# Patient Record
Sex: Female | Born: 1960 | Race: Black or African American | Hispanic: No | Marital: Married | Smoking: Current every day smoker
Health system: Southern US, Community
[De-identification: ages and names within clinical notes are randomized; demographics above are authoritative.]

## PROBLEM LIST (undated history)

## (undated) DIAGNOSIS — I10 Essential (primary) hypertension: Secondary | ICD-10-CM

## (undated) DIAGNOSIS — I714 Abdominal aortic aneurysm, without rupture, unspecified: Secondary | ICD-10-CM

## (undated) DIAGNOSIS — I739 Peripheral vascular disease, unspecified: Secondary | ICD-10-CM

## (undated) DIAGNOSIS — E739 Lactose intolerance, unspecified: Secondary | ICD-10-CM

## (undated) DIAGNOSIS — K219 Gastro-esophageal reflux disease without esophagitis: Secondary | ICD-10-CM

## (undated) DIAGNOSIS — R198 Other specified symptoms and signs involving the digestive system and abdomen: Secondary | ICD-10-CM

## (undated) DIAGNOSIS — M47816 Spondylosis without myelopathy or radiculopathy, lumbar region: Secondary | ICD-10-CM

## (undated) DIAGNOSIS — M069 Rheumatoid arthritis, unspecified: Secondary | ICD-10-CM

## (undated) DIAGNOSIS — E079 Disorder of thyroid, unspecified: Secondary | ICD-10-CM

## (undated) DIAGNOSIS — E785 Hyperlipidemia, unspecified: Secondary | ICD-10-CM

## (undated) DIAGNOSIS — E0590 Thyrotoxicosis, unspecified without thyrotoxic crisis or storm: Secondary | ICD-10-CM

## (undated) DIAGNOSIS — K5090 Crohn's disease, unspecified, without complications: Secondary | ICD-10-CM

## (undated) DIAGNOSIS — R011 Cardiac murmur, unspecified: Secondary | ICD-10-CM

## (undated) DIAGNOSIS — A64 Unspecified sexually transmitted disease: Secondary | ICD-10-CM

## (undated) DIAGNOSIS — E559 Vitamin D deficiency, unspecified: Secondary | ICD-10-CM

## (undated) DIAGNOSIS — R0600 Dyspnea, unspecified: Secondary | ICD-10-CM

## (undated) DIAGNOSIS — F32A Depression, unspecified: Secondary | ICD-10-CM

## (undated) HISTORY — PX: OTHER SURGICAL HISTORY: SHX169

## (undated) HISTORY — PX: TONSILLECTOMY AND ADENOIDECTOMY: SUR1326

## (undated) HISTORY — PX: ABDOMINAL HYSTERECTOMY: SHX81

## (undated) HISTORY — DX: Rheumatoid arthritis, unspecified: M06.9

## (undated) HISTORY — DX: Abdominal aortic aneurysm, without rupture, unspecified: I71.40

## (undated) HISTORY — DX: Unspecified sexually transmitted disease: A64

## (undated) HISTORY — DX: Cardiac murmur, unspecified: R01.1

## (undated) HISTORY — DX: Hyperlipidemia, unspecified: E78.5

## (undated) HISTORY — DX: Vitamin D deficiency, unspecified: E55.9

## (undated) HISTORY — DX: Spondylosis without myelopathy or radiculopathy, lumbar region: M47.816

## (undated) HISTORY — DX: Lactose intolerance, unspecified: E73.9

## (undated) HISTORY — PX: CHOLECYSTECTOMY: SHX55

## (undated) HISTORY — DX: Disorder of thyroid, unspecified: E07.9

## (undated) HISTORY — DX: Essential (primary) hypertension: I10

## (undated) HISTORY — DX: Abdominal aortic aneurysm, without rupture: I71.4

## (undated) HISTORY — DX: Crohn's disease, unspecified, without complications: K50.90

## (undated) HISTORY — PX: COLONOSCOPY: SHX174

---

## 1990-03-05 HISTORY — PX: TONSILLECTOMY AND ADENOIDECTOMY: SUR1326

## 2001-12-26 LAB — PULMONARY FUNCTION TEST

## 2012-07-18 LAB — HM COLONOSCOPY

## 2012-09-17 ENCOUNTER — Ambulatory Visit (INDEPENDENT_AMBULATORY_CARE_PROVIDER_SITE_OTHER): Payer: Medicare HMO | Admitting: Family Medicine

## 2012-09-17 ENCOUNTER — Encounter: Payer: Self-pay | Admitting: Family Medicine

## 2012-09-17 VITALS — BP 158/102 | HR 72 | Temp 98.0°F | Ht 65.5 in | Wt 247.0 lb

## 2012-09-17 DIAGNOSIS — M47817 Spondylosis without myelopathy or radiculopathy, lumbosacral region: Secondary | ICD-10-CM

## 2012-09-17 DIAGNOSIS — M47816 Spondylosis without myelopathy or radiculopathy, lumbar region: Secondary | ICD-10-CM

## 2012-09-17 DIAGNOSIS — M545 Low back pain, unspecified: Secondary | ICD-10-CM

## 2012-09-17 DIAGNOSIS — I10 Essential (primary) hypertension: Secondary | ICD-10-CM

## 2012-09-17 DIAGNOSIS — E7849 Other hyperlipidemia: Secondary | ICD-10-CM | POA: Insufficient documentation

## 2012-09-17 DIAGNOSIS — E785 Hyperlipidemia, unspecified: Secondary | ICD-10-CM | POA: Insufficient documentation

## 2012-09-17 DIAGNOSIS — M79605 Pain in left leg: Secondary | ICD-10-CM

## 2012-09-17 DIAGNOSIS — Z720 Tobacco use: Secondary | ICD-10-CM | POA: Insufficient documentation

## 2012-09-17 DIAGNOSIS — F17200 Nicotine dependence, unspecified, uncomplicated: Secondary | ICD-10-CM

## 2012-09-17 MED ORDER — ATENOLOL 50 MG PO TABS: 50.0000 mg | ORAL_TABLET | Freq: Every day | ORAL | Status: DC

## 2012-09-17 NOTE — Patient Instructions (Addendum)
Nice to meet you. Please stop taking the Toprol. We are starting atenolol 50 mg daily. Follow up in 2 weeks for a complete physical.  Please stop by to see Shirlee Limerick on your way out to set up your referral.
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
## 2012-09-23 ENCOUNTER — Other Ambulatory Visit: Payer: Self-pay | Admitting: Family Medicine

## 2012-09-23 DIAGNOSIS — I10 Essential (primary) hypertension: Secondary | ICD-10-CM

## 2012-09-23 DIAGNOSIS — E785 Hyperlipidemia, unspecified: Secondary | ICD-10-CM

## 2012-09-25 ENCOUNTER — Other Ambulatory Visit (INDEPENDENT_AMBULATORY_CARE_PROVIDER_SITE_OTHER): Payer: Medicare HMO

## 2012-09-25 DIAGNOSIS — E785 Hyperlipidemia, unspecified: Secondary | ICD-10-CM

## 2012-09-25 LAB — LIPID PANEL
Cholesterol: 183 mg/dL (ref 0–200)
HDL: 31.7 mg/dL — ABNORMAL LOW (ref >39.00)
LDL Cholesterol: 124 mg/dL — ABNORMAL HIGH (ref 0–99)
Total CHOL/HDL Ratio: 6
Triglycerides: 135 mg/dL (ref 0.0–149.0)
VLDL: 27 mg/dL (ref 0.0–40.0)

## 2012-09-25 LAB — COMPREHENSIVE METABOLIC PANEL
ALT: 12 U/L (ref 0–35)
AST: 15 U/L (ref 0–37)
Albumin: 4 g/dL (ref 3.5–5.2)
Alkaline Phosphatase: 95 U/L (ref 39–117)
BUN: 21 mg/dL (ref 6–23)
CO2: 24 mEq/L (ref 19–32)
Calcium: 9.7 mg/dL (ref 8.4–10.5)
Chloride: 109 mEq/L (ref 96–112)
Creatinine, Ser: 1.1 mg/dL (ref 0.4–1.2)
GFR: 66.7 mL/min (ref >60.00)
Glucose, Bld: 83 mg/dL (ref 70–99)
Potassium: 4 mEq/L (ref 3.5–5.1)
Sodium: 141 mEq/L (ref 135–145)
Total Bilirubin: 0.5 mg/dL (ref 0.3–1.2)
Total Protein: 7.9 g/dL (ref 6.0–8.3)

## 2012-09-25 LAB — CBC WITH DIFFERENTIAL/PLATELET
Basophils Absolute: 0.1 10*3/uL (ref 0.0–0.1)
Basophils Relative: 0.4 % (ref 0.0–3.0)
Eosinophils Absolute: 0.4 10*3/uL (ref 0.0–0.7)
Eosinophils Relative: 3.4 % (ref 0.0–5.0)
HCT: 48.3 % — ABNORMAL HIGH (ref 36.0–46.0)
Hemoglobin: 15.9 g/dL — ABNORMAL HIGH (ref 12.0–15.0)
Lymphocytes Relative: 32.2 % (ref 12.0–46.0)
Lymphs Abs: 4.2 10*3/uL — ABNORMAL HIGH (ref 0.7–4.0)
MCHC: 32.9 g/dL (ref 30.0–36.0)
MCV: 88.3 fl (ref 78.0–100.0)
Monocytes Absolute: 0.9 10*3/uL (ref 0.1–1.0)
Monocytes Relative: 6.9 % (ref 3.0–12.0)
Neutro Abs: 7.3 10*3/uL (ref 1.4–7.7)
Neutrophils Relative %: 57.1 % (ref 43.0–77.0)
Platelets: 225 10*3/uL (ref 150.0–400.0)
RBC: 5.47 Mil/uL — ABNORMAL HIGH (ref 3.87–5.11)
RDW: 14.7 % — ABNORMAL HIGH (ref 11.5–14.6)
WBC: 12.9 10*3/uL — ABNORMAL HIGH (ref 4.5–10.5)

## 2012-10-02 ENCOUNTER — Encounter: Payer: Self-pay | Admitting: Family Medicine

## 2012-10-02 ENCOUNTER — Other Ambulatory Visit (HOSPITAL_COMMUNITY)
Admission: RE | Admit: 2012-10-02 | Discharge: 2012-10-02 | Disposition: A | Discharge status: Home / Self Care | Payer: Medicare HMO | Source: Ambulatory Visit | Attending: Family Medicine | Admitting: Family Medicine

## 2012-10-02 ENCOUNTER — Ambulatory Visit (INDEPENDENT_AMBULATORY_CARE_PROVIDER_SITE_OTHER): Payer: Medicare HMO | Admitting: Family Medicine

## 2012-10-02 VITALS — BP 158/102 | HR 64 | Temp 98.0°F | Ht 65.5 in | Wt 250.0 lb

## 2012-10-02 DIAGNOSIS — R059 Cough, unspecified: Secondary | ICD-10-CM

## 2012-10-02 DIAGNOSIS — Z720 Tobacco use: Secondary | ICD-10-CM

## 2012-10-02 DIAGNOSIS — Z113 Encounter for screening for infections with a predominantly sexual mode of transmission: Secondary | ICD-10-CM | POA: Insufficient documentation

## 2012-10-02 DIAGNOSIS — Z1231 Encounter for screening mammogram for malignant neoplasm of breast: Secondary | ICD-10-CM

## 2012-10-02 DIAGNOSIS — Z01419 Encounter for gynecological examination (general) (routine) without abnormal findings: Secondary | ICD-10-CM | POA: Insufficient documentation

## 2012-10-02 DIAGNOSIS — Z1151 Encounter for screening for human papillomavirus (HPV): Secondary | ICD-10-CM | POA: Insufficient documentation

## 2012-10-02 DIAGNOSIS — I10 Essential (primary) hypertension: Secondary | ICD-10-CM

## 2012-10-02 DIAGNOSIS — R05 Cough: Secondary | ICD-10-CM

## 2012-10-02 DIAGNOSIS — E785 Hyperlipidemia, unspecified: Secondary | ICD-10-CM

## 2012-10-02 DIAGNOSIS — F17200 Nicotine dependence, unspecified, uncomplicated: Secondary | ICD-10-CM

## 2012-10-02 DIAGNOSIS — D72829 Elevated white blood cell count, unspecified: Secondary | ICD-10-CM

## 2012-10-02 MED ORDER — AZITHROMYCIN 250 MG PO TABS: ORAL_TABLET | ORAL | Status: DC

## 2012-10-02 MED ORDER — ATENOLOL 100 MG PO TABS: 100.0000 mg | ORAL_TABLET | Freq: Every day | ORAL | Status: DC

## 2012-10-02 MED ORDER — VARENICLINE TARTRATE 0.5 MG X 11 & 1 MG X 42 PO MISC: ORAL | Status: DC

## 2012-10-02 MED ORDER — ALBUTEROL SULFATE HFA 108 (90 BASE) MCG/ACT IN AERS: 2.0000 | INHALATION_SPRAY | Freq: Four times a day (QID) | RESPIRATORY_TRACT | Status: DC | PRN
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
## 2012-10-02 NOTE — Patient Instructions (Addendum)
Good to see you. Please come back to see me in 2-4 weeks to recheck your blood pressure.  We are increasing your atenolol to 100 mg daily.  We will also recheck your blood work in 6 months.  Please stop by to see Shirlee Limerick on your way out to set up your mammogram referral.  Please take zpack as directed, use albuterol inhaler as needed for cough and wheezing.  I know you are ready to quit smoking and you CAN do this- take Chantix as directed.

## 2012-10-02 NOTE — Addendum Note (Signed)
Addended by: Eliezer Bottom on: 10/02/2012 05:04 PM   Modules accepted: Orders

## 2012-10-08 LAB — HM PAP SMEAR: HM Pap smear: NORMAL

## 2012-10-09 ENCOUNTER — Encounter: Payer: Self-pay | Admitting: Family Medicine

## 2012-10-17 ENCOUNTER — Ambulatory Visit
Admission: RE | Admit: 2012-10-17 | Discharge: 2012-10-17 | Disposition: A | Discharge status: Home / Self Care | Payer: Medicare HMO | Source: Ambulatory Visit | Attending: Family Medicine | Admitting: Family Medicine

## 2012-10-17 DIAGNOSIS — Z1231 Encounter for screening mammogram for malignant neoplasm of breast: Secondary | ICD-10-CM

## 2012-10-24 ENCOUNTER — Ambulatory Visit (INDEPENDENT_AMBULATORY_CARE_PROVIDER_SITE_OTHER): Payer: Medicare HMO | Admitting: Family Medicine

## 2012-10-24 ENCOUNTER — Encounter: Payer: Self-pay | Admitting: Family Medicine

## 2012-10-24 VITALS — BP 142/82 | HR 60 | Temp 97.8°F | Wt 252.8 lb

## 2012-10-24 DIAGNOSIS — I10 Essential (primary) hypertension: Secondary | ICD-10-CM

## 2012-10-24 DIAGNOSIS — E785 Hyperlipidemia, unspecified: Secondary | ICD-10-CM

## 2012-10-24 DIAGNOSIS — R7989 Other specified abnormal findings of blood chemistry: Secondary | ICD-10-CM

## 2012-10-24 NOTE — Progress Notes (Signed)
Subjective:    Patient ID: Kathy Herrera [facility], female    DOB: 1960-12-28, 52 y.o.   MRN: [id number]  HPI Very pleasant 52 yo female who established care with last month here for follow up.  HTN - HTN for over 10 years.  Previously well controlled on Atenolol and lisinopril.  Recently, current PCP changed atenolol to Toprol XL and she is not sure whyand she had poorly controlled HTN.   Restarted atenolol and increased dose t 100 mg daily on 10/02/2012.  Also takes Lisinopril 40 mg daily.  Feels like BP has been improving.    BP Readings from Last 3 Encounters:  10/24/12 142/82  10/02/12 158/102  09/17/12 158/102     Tobacco abuse- has tried patches and gums, have not worked.  She does want to quit smoking.  Prescribed Chantix on 7/31.  She has already cut back to 1/2 ppd and feels great.  She is worried that she will continue to gain weight but otherwise feels good.  Lab Results  Component Value Date   CHOL 183 09/25/2012   HDL 31.70* 09/25/2012   LDLCALC 124* 09/25/2012   TRIG 135.0 09/25/2012   CHOLHDL 6 09/25/2012    Patient Active Problem List   Diagnosis Date Noted  . Abnormal CBC 10/24/2012  . Cough 10/02/2012  . Tobacco abuse 09/17/2012  . DJD (degenerative joint disease) of lumbar spine   . Hypertension   . Hyperlipidemia    Past Medical History  Diagnosis Date  . DJD (degenerative joint disease) of lumbar spine   . Heart murmur   . Hypertension   . Hyperlipidemia    Past Surgical History  Procedure Laterality Date  . Abdominal hysterectomy      partial- still has cervix.  . Cholecystectomy    . Tonsillectomy and adenoidectomy     History  Substance Use Topics  . Smoking status: Current Every Day Smoker  . Smokeless tobacco: Not on file  . Alcohol Use: Not on file   Family History  Problem Relation Age of Onset  . Cancer Mother 43    cervical   No Known Allergies Current Outpatient Prescriptions on File Prior to Visit  Medication Sig Dispense Refill  .  albuterol (PROVENTIL HFA;VENTOLIN HFA) 108 (90 BASE) MCG/ACT inhaler Inhale 2 puffs into the lungs every 6 (six) hours as needed for wheezing.  1 Inhaler  0  . atenolol (TENORMIN) 100 MG tablet Take 1 tablet (100 mg total) by mouth daily.  90 tablet  3  . azithromycin (ZITHROMAX) 250 MG tablet 2 tabs by mouth on day 1 followed by 1 tab by mouth daily days 2-5  6 tablet  0  . diclofenac (VOLTAREN) 75 MG EC tablet Take 75 mg by mouth 2 (two) times daily.      . ergocalciferol (VITAMIN D2) 50000 UNITS capsule Take 50,000 Units by mouth once a week.      Marland Kitchen lisinopril (PRINIVIL,ZESTRIL) 40 MG tablet Take 40 mg by mouth daily.      . traMADol (ULTRAM) 50 MG tablet Take one by mouth daily      . varenicline (CHANTIX STARTING MONTH PAK) 0.5 MG X 11 & 1 MG X 42 tablet Take one 0.5 mg tablet by mouth once daily for 3 days, then increase to one 0.5 mg tablet twice daily for 4 days, then increase to one 1 mg tablet twice daily.  53 tablet  0   No current facility-administered medications on file prior to visit.  The PMH, PSH, Social History, Family History, Medications, and allergies have been reviewed in [facility], and have been updated if relevant.    Review of Systems See HPI No CP, HA or SOB    Objective:   Physical Exam  Wt Readings from Last 3 Encounters:  10/24/12 252 lb 12.8 oz (114.669 kg)  10/02/12 250 lb (113.399 kg)  09/17/12 247 lb (112.038 kg)     General:  Obese, well-nourished,in no acute distress; alert,appropriate and cooperative throughout examination Head:  normocephalic and atraumatic.   Eyes:  vision grossly intact, pupils equal, pupils round, and pupils reactive to light.   Ears:  R ear normal and L ear normal.   Nose:  no external deformity.   Mouth:  good dentition.   Neck:  No deformities, masses, or tenderness noted. Heart:  Normal rate and regular rhythm. S1 and S2 normal without gallop, murmur, click, rub or other extra sounds. Msk:  No deformity or scoliosis noted of  thoracic or lumbar spine.   Extremities:  No clubbing, cyanosis, edema, or deformity noted with normal full range of motion of all joints.   Neurologic:  alert & oriented X3 and gait normal.   Skin:  Intact without suspicious lesions or rashes Psych:  Cognition and judgment appear intact. Alert and cooperative with normal attention span and concentration. No apparent delusions, illusions, hallucinations      Assessment & Plan:  1. Hypertension Improved, almost at goal.  2. Tobacco abuse Improving.  She has cut back to 1/2 ppd.  3. Hyperlipidemia Diet controlled.

## 2012-10-24 NOTE — Patient Instructions (Addendum)
Good to see you. When you get your blood pressure cuff, please call me with readings.  I am so proud of you!

## 2012-11-21 ENCOUNTER — Ambulatory Visit (INDEPENDENT_AMBULATORY_CARE_PROVIDER_SITE_OTHER): Payer: Medicare HMO | Admitting: Neurology

## 2012-11-21 ENCOUNTER — Ambulatory Visit (INDEPENDENT_AMBULATORY_CARE_PROVIDER_SITE_OTHER): Payer: Self-pay

## 2012-11-21 DIAGNOSIS — Z0289 Encounter for other administrative examinations: Secondary | ICD-10-CM

## 2012-11-21 DIAGNOSIS — Z720 Tobacco use: Secondary | ICD-10-CM

## 2012-11-21 DIAGNOSIS — E785 Hyperlipidemia, unspecified: Secondary | ICD-10-CM

## 2012-11-21 DIAGNOSIS — M47816 Spondylosis without myelopathy or radiculopathy, lumbar region: Secondary | ICD-10-CM

## 2012-11-21 DIAGNOSIS — G544 Lumbosacral root disorders, not elsewhere classified: Secondary | ICD-10-CM

## 2012-11-21 DIAGNOSIS — I10 Essential (primary) hypertension: Secondary | ICD-10-CM
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
## 2012-12-18 ENCOUNTER — Encounter: Payer: Self-pay | Admitting: Family Medicine

## 2012-12-18 ENCOUNTER — Ambulatory Visit (INDEPENDENT_AMBULATORY_CARE_PROVIDER_SITE_OTHER): Payer: Medicare HMO | Admitting: Family Medicine

## 2012-12-18 VITALS — BP 132/94 | HR 73 | Temp 98.0°F | Wt 255.5 lb

## 2012-12-18 DIAGNOSIS — Z23 Encounter for immunization: Secondary | ICD-10-CM

## 2012-12-18 DIAGNOSIS — R9431 Abnormal electrocardiogram [ECG] [EKG]: Secondary | ICD-10-CM | POA: Insufficient documentation

## 2012-12-18 DIAGNOSIS — Z01818 Encounter for other preprocedural examination: Secondary | ICD-10-CM

## 2012-12-18 NOTE — Patient Instructions (Signed)
Good to see you. We will call you with a cardiology appointment.  Keep working on putting down those cigarettes.

## 2012-12-18 NOTE — Progress Notes (Signed)
Subjective:    Kathy Herrera is a 52 y.o. female who presents to the office today for a preoperative consultation at the request of surgeon Otelia Sergeant who plans on performing lumbar surgery.    Planned anesthesia: general. The patient has the following known anesthesia issues: none. Patients bleeding risk: no recent abnormal bleeding.   Patient Active Problem List   Diagnosis Date Noted  . Pre-operative clearance 12/18/2012  . Nonspecific abnormal electrocardiogram (ECG) (EKG) 12/18/2012  . Abnormal CBC 10/24/2012  . Cough 10/02/2012  . Tobacco abuse 09/17/2012  . DJD (degenerative joint disease) of lumbar spine   . Hypertension   . Hyperlipidemia    Past Medical History  Diagnosis Date  . DJD (degenerative joint disease) of lumbar spine   . Heart murmur   . Hypertension   . Hyperlipidemia    Past Surgical History  Procedure Laterality Date  . Abdominal hysterectomy      partial- still has cervix.  . Cholecystectomy    . Tonsillectomy and adenoidectomy     History  Substance Use Topics  . Smoking status: Current Every Day Smoker -- 0.50 packs/day  . Smokeless tobacco: Not on file  . Alcohol Use: No   Family History  Problem Relation Age of Onset  . Cancer Mother 96    cervical   No Known Allergies Current Outpatient Prescriptions on File Prior to Visit  Medication Sig Dispense Refill  . albuterol (PROVENTIL HFA;VENTOLIN HFA) 108 (90 BASE) MCG/ACT inhaler Inhale 2 puffs into the lungs every 6 (six) hours as needed for wheezing.  1 Inhaler  0  . atenolol (TENORMIN) 100 MG tablet Take 1 tablet (100 mg total) by mouth daily.  90 tablet  3  . diclofenac (VOLTAREN) 75 MG EC tablet Take 75 mg by mouth 2 (two) times daily.      Marland Kitchen lisinopril (PRINIVIL,ZESTRIL) 40 MG tablet Take 40 mg by mouth daily.      . traMADol (ULTRAM) 50 MG tablet Take one by mouth daily       No current facility-administered medications on file prior to visit.   The PMH, PSH, Social History, Family  History, Medications, and allergies have been reviewed in [facility], and have been updated if relevant.     Review of Systems No CP or SOB No DOE but per pt "cannot move that fast"   Objective:  BP 132/94  Pulse 73  Temp(Src) 98 F (36.7 C) (Oral)  Wt 255 lb 8 oz (115.894 kg)  BMI 41.86 kg/m2  SpO2 95% Gen:  Alert, pleasant, obese, NAD Resp:  CTA bilaterally CVS:  RRR Ext:  No edema  Predictors of intubation difficulty:  Morbid obesity? yes    Cardiographics ECG: left axis anterior fasicular, diffuse non specific T changes heart block   Lab Review  Lab Results  Component Value Date   WBC 12.9* 09/25/2012   HGB 15.9* 09/25/2012   HCT 48.3* 09/25/2012   MCV 88.3 09/25/2012   PLT 225.0 09/25/2012   Lab Results  Component Value Date   CREATININE 1.1 09/25/2012   Lab Results  Component Value Date   NA 141 09/25/2012   K 4.0 09/25/2012   CL 109 09/25/2012   CO2 24 09/25/2012      Assessment:      52 y.o. female with planned surgery as above.   Known risk factors for perioperative complications: None Tobacco abuse, obesity, abnormal EKG         Plan:   Cardiac Risk  Estimation: Low but will refer to cardiology given EKG changes (unchanged since 12/2011).

## 2012-12-23 ENCOUNTER — Encounter: Payer: Self-pay | Admitting: Cardiovascular Disease

## 2012-12-23 ENCOUNTER — Ambulatory Visit (INDEPENDENT_AMBULATORY_CARE_PROVIDER_SITE_OTHER): Payer: Medicare HMO | Admitting: Cardiovascular Disease

## 2012-12-23 VITALS — BP 180/105 | HR 54 | Ht 65.0 in | Wt 257.5 lb

## 2012-12-23 DIAGNOSIS — I10 Essential (primary) hypertension: Secondary | ICD-10-CM

## 2012-12-23 DIAGNOSIS — R9431 Abnormal electrocardiogram [ECG] [EKG]: Secondary | ICD-10-CM

## 2012-12-23 DIAGNOSIS — R000 Tachycardia, unspecified: Secondary | ICD-10-CM

## 2012-12-23 MED ORDER — HYDROCHLOROTHIAZIDE 25 MG PO TABS: 25.0000 mg | ORAL_TABLET | Freq: Every day | ORAL | Status: DC

## 2012-12-23 MED ORDER — POTASSIUM CHLORIDE ER 10 MEQ PO TBCR: 10.0000 meq | EXTENDED_RELEASE_TABLET | Freq: Every day | ORAL | Status: DC

## 2012-12-23 NOTE — Assessment & Plan Note (Signed)
Her blood pressure is still significant elevated. I had a long discussion with her regarding importance of avoiding salt, smoking cessation, weight loss.   We will start HCTZ 25 mg a day and potassium chloride 10 mg a day. She has some concerns about starting HCTZ because she goes to the bathroom so much at night.  I will see her 1 time in followup to make sure that her electrolytes remained stable. Otherwise, she'll followup with her general medical Dr.

## 2012-12-23 NOTE — Patient Instructions (Addendum)
Your physician has recommended you make the following change in your medication:  1) Start hydrochlorothiazide (HCTZ) 25 mg one tablet by mouth daily. 2) Start potassium 10 meq one tablet by mouth   Your physician has requested that you have an echocardiogram in the [place] office. Echocardiography is a painless test that uses sound waves to create images of your heart. It provides your doctor with information about the size and shape of your heart and how well your heart's chambers and valves are working. This procedure takes approximately one hour. There are no restrictions for this procedure.  Your physician recommends that you schedule a follow-up appointment in: 4-6 weeks with Dr. Elease Hashimoto in the [place] office.

## 2012-12-23 NOTE — Assessment & Plan Note (Signed)
Kathy Herrera presents today for further evaluation of an abnormal EKG. Her EKG reveals left ventricular hypertrophy with repolarization of breath. We'll get an echocardiogram to verify that she has normal left ventricular systolic function but I suspect that we will find that she has significant LVH as an explanation for her abnormal EKG. She does not have any episodes of angina or shortness breath. She is limited only by her back and hip pain.  From my standpoint, she is at low risk for having back surgery although we should make sure that her hypertension is well controlled before she goes in for surgery. I Started her on HCTZ and potassium in addition to her other medications. She'll followup with her general medical Dr. Diamantina Providence see her in 4-6 weeks to ensure that  she has 1 follow up after starting new medications.  Will get a basic metabolic profile.  Following that I will turn her  back over to her medical Dr.

## 2012-12-23 NOTE — Progress Notes (Signed)
Kathy Herrera Date of Birth  09-Aug-1960       [facility]    [place] [address], [address]  [address] [place], [place]  [address]   [place], [place]  [address] [phone]     [phone]   Fax  [phone]    Fax [phone]  Problem List: 1. Hypertension 2. Chronic back pain 3.  Hyperlipidemia  History of Present Illness:  Kathy Herrera is a 52 yo with history of hypertension, hyperlipidemia, and chronic back pain. She was referred over for preoperative evaluation prior to having back surgery. Her EKG has some mild abnormalities. EKG is unchanged since 2013.  She takes her medications regularly.  She still smokes.   She still eats some seasoned foods.  She does eat out occasionally.  She does not get any regular exercise ( due to back pain).    She is able to walk up stairs, bring in the groceries without any CP or dyspnea.  She is limited by her back and hip pain.    Current Outpatient Prescriptions on File Prior to Visit  Medication Sig Dispense Refill  . albuterol (PROVENTIL HFA;VENTOLIN HFA) 108 (90 BASE) MCG/ACT inhaler Inhale 2 puffs into the lungs every 6 (six) hours as needed for wheezing.  1 Inhaler  0  . atenolol (TENORMIN) 100 MG tablet Take 1 tablet (100 mg total) by mouth daily.  90 tablet  3  . diclofenac (VOLTAREN) 75 MG EC tablet Take 75 mg by mouth 2 (two) times daily.      Marland Kitchen HYDROcodone-acetaminophen (NORCO/VICODIN) 5-325 MG per tablet Take 1 tablet by mouth daily as needed.      Marland Kitchen lisinopril (PRINIVIL,ZESTRIL) 40 MG tablet Take 40 mg by mouth daily.       No current facility-administered medications on file prior to visit.    Allergies  Allergen Reactions  . Shrimp [Shellfish Allergy]     May be seasoning not always    Past Medical History  Diagnosis Date  . DJD (degenerative joint disease) of lumbar spine   . Heart murmur   . Hypertension   . Hyperlipidemia     Past Surgical History  Procedure Laterality  Date  . Abdominal hysterectomy      partial- still has cervix.  . Cholecystectomy    . Tonsillectomy and adenoidectomy    . Arm surgery Left     History  Smoking status  . Current Every Day Smoker -- 0.50 packs/day for 40 years  . Types: Cigarettes  Smokeless tobacco  . Not on file    History  Alcohol Use  . Yes    Comment: occassion    Family History  Problem Relation Age of Onset  . Cancer Mother 17    cervical  . CVA Mother   . Hypertension Mother     Reviw of Systems:  Reviewed in the HPI.  All other systems are negative.  Physical Exam: Blood pressure 214/103, pulse 54, height 5\' 5"  (1.651 m), weight 257 lb 8 oz (116.801 kg). General: Well developed, well nourished, in no acute distress.  Head: Normocephalic, atraumatic, sclera non-icteric, mucus membranes are moist,   Neck: Supple. Carotids are 2 + without bruits. No JVD   Lungs: Clear   Heart: RR, normal S1, s2  Abdomen: Soft, non-tender, non-distended with normal bowel sounds, moderately obese  Msk:  Strength and tone are normal   Extremities: No clubbing or cyanosis. No edema.  Distal  pedal pulses are 2+ and equal    Neuro: CN II - XII intact.  Alert and oriented X 3.   Psych:  Normal   ECG: Oct. 21, 2014:  Sinus brady at 61.  TWI in lateral leads - c/w LVH with repol abn.  Assessment / Plan:

## 2013-01-07 ENCOUNTER — Ambulatory Visit (HOSPITAL_COMMUNITY): Payer: Medicare HMO | Attending: Cardiovascular Disease | Admitting: Cardiology

## 2013-01-07 ENCOUNTER — Other Ambulatory Visit (HOSPITAL_COMMUNITY): Payer: Self-pay | Admitting: Cardiovascular Disease

## 2013-01-07 DIAGNOSIS — E785 Hyperlipidemia, unspecified: Secondary | ICD-10-CM | POA: Insufficient documentation

## 2013-01-07 DIAGNOSIS — R9431 Abnormal electrocardiogram [ECG] [EKG]: Secondary | ICD-10-CM | POA: Insufficient documentation

## 2013-01-07 DIAGNOSIS — I10 Essential (primary) hypertension: Secondary | ICD-10-CM | POA: Insufficient documentation

## 2013-01-07 DIAGNOSIS — F17200 Nicotine dependence, unspecified, uncomplicated: Secondary | ICD-10-CM | POA: Insufficient documentation

## 2013-01-07 NOTE — Progress Notes (Signed)
Echo performed. 

## 2013-01-22 ENCOUNTER — Telehealth: Payer: Self-pay | Admitting: Cardiovascular Disease

## 2013-01-22 NOTE — Telephone Encounter (Signed)
Pt had to cancel her Monday appt with Dr. Elease Hashimoto. I have rescheduled that appointment for her. I reviewed her office note with Dr. Elease Hashimoto & she is aware he wanted to follow-up with her after starting the HCTZ & Potassium & possible lab work.  Pt understands & agrees with appt schedule. She will follow-up with her pcp after for htn. Mylo Red RN

## 2013-01-22 NOTE — Telephone Encounter (Signed)
New problem:  Pt is asking if she needs to f/u with Dr. Elease Hashimoto about her BP or if she can just f/u with her PcP. Pt canceled her appt w/ Dr. Elease Hashimoto that was scheduled for Monday. Pt states she does not have the money to come... Pt would like to be advised if she medically needs to f/u w/ Dr. Elease Hashimoto

## 2013-01-23 NOTE — Telephone Encounter (Signed)
noted 

## 2013-01-26 ENCOUNTER — Ambulatory Visit: Payer: Medicare HMO | Admitting: Cardiovascular Disease

## 2013-02-11 ENCOUNTER — Other Ambulatory Visit: Payer: Self-pay | Admitting: *Deleted

## 2013-02-11 ENCOUNTER — Ambulatory Visit (INDEPENDENT_AMBULATORY_CARE_PROVIDER_SITE_OTHER): Payer: Commercial Managed Care - HMO | Admitting: Physician Assistant

## 2013-02-11 ENCOUNTER — Encounter: Payer: Self-pay | Admitting: Physician Assistant

## 2013-02-11 VITALS — BP 160/98 | HR 52 | Ht 66.0 in | Wt 254.0 lb

## 2013-02-11 DIAGNOSIS — E66813 Obesity, class 3: Secondary | ICD-10-CM

## 2013-02-11 DIAGNOSIS — I10 Essential (primary) hypertension: Secondary | ICD-10-CM

## 2013-02-11 MED ORDER — HYDROCHLOROTHIAZIDE 25 MG PO TABS: 25.0000 mg | ORAL_TABLET | Freq: Every day | ORAL | Status: DC

## 2013-02-11 MED ORDER — CLONIDINE HCL 0.1 MG PO TABS: 0.1000 mg | ORAL_TABLET | Freq: Every day | ORAL | Status: DC

## 2013-02-11 MED ORDER — POTASSIUM CHLORIDE ER 10 MEQ PO TBCR: 10.0000 meq | EXTENDED_RELEASE_TABLET | Freq: Every day | ORAL | Status: DC

## 2013-02-11 NOTE — Assessment & Plan Note (Signed)
Smoking cessation discussed 

## 2013-02-11 NOTE — Assessment & Plan Note (Signed)
Weight loss discussed with the patient. She is unable to exercise because of her chronic back pain. Hopefully after she has surgery she will be able begin an exercise program.

## 2013-02-11 NOTE — Assessment & Plan Note (Signed)
Blood pressure is still elevated today. Have talked to her about reducing her sodium intake and trying to lose weight. All add low-dose clonidine 0.1 mg daily. She would like to followup with her primary care for her hypertension. Recommend 2 g sodium diet, weight loss, and smoking cessation. 2-D echo showed mild LVH with grade 1 diastolic dysfunction. Followup with Dr. Elease Hashimoto her in 1 year.
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
## 2013-02-11 NOTE — Patient Instructions (Addendum)
Your physician has recommended you make the following change in your medication:   1. Start clonidine 0.1mg  1 tab daily  2 Gram Low Sodium Diet A 2 gram sodium diet restricts the amount of sodium in the diet to no more than 2 g or 2000 mg daily. Limiting the amount of sodium is often used to help lower blood pressure. It is important if you have heart, liver, or kidney problems. Many foods contain sodium for flavor and sometimes as a preservative. When the amount of sodium in a diet needs to be low, it is important to know what to look for when choosing foods and drinks. The following includes some information and guidelines to help make it easier for you to adapt to a low sodium diet. QUICK TIPS  Do not add salt to food.  Avoid convenience items and fast food.  Choose unsalted snack foods.  Buy lower sodium products, often labeled as "lower sodium" or "no salt added."  Check food labels to learn how much sodium is in 1 serving.  When eating at a restaurant, ask that your food be prepared with less salt or none, if possible. READING FOOD LABELS FOR SODIUM INFORMATION The nutrition facts label is a good place to find how much sodium is in foods. Look for products with no more than 500 to 600 mg of sodium per meal and no more than 150 mg per serving. Remember that 2 g = 2000 mg. The food label may also list foods as:  Sodium-free: Less than 5 mg in a serving.  Very low sodium: 35 mg or less in a serving.  Low-sodium: 140 mg or less in a serving.  Light in sodium: 50% less sodium in a serving. For example, if a food that usually has 300 mg of sodium is changed to become light in sodium, it will have 150 mg of sodium.  Reduced sodium: 25% less sodium in a serving. For example, if a food that usually has 400 mg of sodium is changed to reduced sodium, it will have 300 mg of sodium. CHOOSING FOODS Grains  Avoid: Salted crackers and snack items. Some cereals, including instant hot  cereals. Bread stuffing and biscuit mixes. Seasoned rice or pasta mixes.  Choose: Unsalted snack items. Low-sodium cereals, oats, puffed wheat and rice, shredded wheat. English muffins and bread. Pasta. Meats  Avoid: Salted, canned, smoked, spiced, pickled meats, including fish and poultry. Bacon, ham, sausage, cold cuts, hot dogs, anchovies.  Choose: Low-sodium canned tuna and salmon. Fresh or frozen meat, poultry, and fish. Dairy  Avoid: Processed cheese and spreads. Cottage cheese. Buttermilk and condensed milk. Regular cheese.  Choose: Milk. Low-sodium cottage cheese. Yogurt. Sour cream. Low-sodium cheese. Fruits and Vegetables  Avoid: Regular canned vegetables. Regular canned tomato sauce and paste. Frozen vegetables in sauces. Olives. Rosita Fire. Relishes. Sauerkraut.  Choose: Low-sodium canned vegetables. Low-sodium tomato sauce and paste. Frozen or fresh vegetables. Fresh and frozen fruit. Condiments  Avoid: Canned and packaged gravies. Worcestershire sauce. Tartar sauce. Barbecue sauce. Soy sauce. Steak sauce. Ketchup. Onion, garlic, and table salt. Meat flavorings and tenderizers.  Choose: Fresh and dried herbs and spices. Low-sodium varieties of mustard and ketchup. Lemon juice. Tabasco sauce. Horseradish. SAMPLE 2 GRAM SODIUM MEAL PLAN Breakfast / Sodium (mg)  1 cup low-fat milk / 143 mg  2 slices whole-wheat toast / 270 mg  1 tbs heart-healthy margarine / 153 mg  1 hard-boiled egg / 139 mg  1 small orange / 0 mg Lunch /  Sodium (mg)  1 cup raw carrots / 76 mg   cup hummus / 298 mg  1 cup low-fat milk / 143 mg   cup red grapes / 2 mg  1 whole-wheat pita bread / 356 mg Dinner / Sodium (mg)  1 cup whole-wheat pasta / 2 mg  1 cup low-sodium tomato sauce / 73 mg  3 oz lean ground beef / 57 mg  1 small side salad (1 cup raw spinach leaves,  cup cucumber,  cup yellow bell pepper) with 1 tsp olive oil and 1 tsp red wine vinegar / 25 mg Snack / Sodium  (mg)  1 container low-fat vanilla yogurt / 107 mg  3 graham cracker squares / 127 mg Nutrient Analysis  Calories: 2033  Protein: 77 g  Carbohydrate: 282 g  Fat: 72 g  Sodium: 1971 mg Document Released: 02/19/2005 Document Revised: 05/14/2011 Document Reviewed: 05/23/2009 ExitCare Patient Information 2014 [place], [place].   Your physician recommends that you schedule a follow-up appointment with your primary MD    .

## 2013-02-17 ENCOUNTER — Ambulatory Visit: Payer: Medicare HMO | Admitting: Cardiovascular Disease

## 2013-03-16 ENCOUNTER — Ambulatory Visit (INDEPENDENT_AMBULATORY_CARE_PROVIDER_SITE_OTHER): Payer: Medicare HMO | Admitting: Family Medicine

## 2013-03-16 ENCOUNTER — Encounter: Payer: Self-pay | Admitting: Family Medicine

## 2013-03-16 VITALS — BP 124/72 | HR 72 | Temp 97.9°F | Wt 247.2 lb

## 2013-03-16 DIAGNOSIS — F17200 Nicotine dependence, unspecified, uncomplicated: Secondary | ICD-10-CM

## 2013-03-16 DIAGNOSIS — Z720 Tobacco use: Secondary | ICD-10-CM

## 2013-03-16 DIAGNOSIS — E785 Hyperlipidemia, unspecified: Secondary | ICD-10-CM

## 2013-03-16 DIAGNOSIS — I10 Essential (primary) hypertension: Secondary | ICD-10-CM

## 2013-03-16 MED ORDER — HYDROCODONE-ACETAMINOPHEN 5-325 MG PO TABS: 1.0000 | ORAL_TABLET | Freq: Four times a day (QID) | ORAL | Status: DC | PRN

## 2013-03-16 MED ORDER — LISINOPRIL 40 MG PO TABS: 40.0000 mg | ORAL_TABLET | Freq: Every day | ORAL | Status: DC

## 2013-03-16 MED ORDER — MELOXICAM 15 MG PO TABS: 15.0000 mg | ORAL_TABLET | Freq: Every day | ORAL | Status: DC | PRN

## 2013-03-16 MED ORDER — GABAPENTIN 100 MG PO CAPS: 100.0000 mg | ORAL_CAPSULE | Freq: Every day | ORAL | Status: DC

## 2013-03-16 NOTE — Assessment & Plan Note (Signed)
Smoking cessation discussed.  Still not yet ready to quit.

## 2013-03-16 NOTE — Assessment & Plan Note (Signed)
Improving!  Encouraged her to continue.

## 2013-03-16 NOTE — Patient Instructions (Signed)
Good luck with your surgery! Your blood pressure looks great. I KNOW you can quit smoking!

## 2013-03-16 NOTE — Progress Notes (Signed)
Pre-visit discussion using our clinic review tool. No additional management support is needed unless otherwise documented below in the visit note.  

## 2013-03-16 NOTE — Assessment & Plan Note (Signed)
Well controlled on current rx. No changes. 

## 2013-03-16 NOTE — Progress Notes (Signed)
Subjective:    Patient ID: [place], female    DOB: Jul 01, 1960, 53 y.o.   MRN: [id number]  HPI Very pleasant 53 yo female here for follow up.  HTN - HTN for over 10 years.  Now well controlled on HCTZ, Clonidine, atenolol and Lisinopril.   Denies any HA, blurred vision, CP or SOB. BP Readings from Last 3 Encounters:  03/16/13 124/72  02/11/13 160/98  12/23/12 180/105     Tobacco abuse- smokes cigars.  Has tried patches and gums, have not worked.  She does want to quit smoking.  Prescribed Chantix on 7/31.    Having back surgery next month and thinks she can quit smoking then- "it will be a jump start."  Excited about getting her back surgery.  Wants to be more active so she can lose weight.  Has been working on diet and weight loss.  Wt Readings from Last 3 Encounters:  03/16/13 247 lb 4 oz (112.152 kg)  02/11/13 254 lb (115.214 kg)  12/23/12 257 lb 8 oz (116.801 kg)     Lab Results  Component Value Date   CHOL 183 09/25/2012   HDL 31.70* 09/25/2012   LDLCALC 124* 09/25/2012   TRIG 135.0 09/25/2012   CHOLHDL 6 09/25/2012    Patient Active Problem List   Diagnosis Date Noted  . Obesity, Class III, BMI 40-49.9 (morbid obesity) 02/11/2013  . Nonspecific abnormal electrocardiogram (ECG) (EKG) 12/18/2012  . Abnormal CBC 10/24/2012  . Tobacco abuse 09/17/2012  . DJD (degenerative joint disease) of lumbar spine   . Hypertension   . Hyperlipidemia    Past Medical History  Diagnosis Date  . DJD (degenerative joint disease) of lumbar spine   . Heart murmur   . Hypertension   . Hyperlipidemia    Past Surgical History  Procedure Laterality Date  . Abdominal hysterectomy      partial- still has cervix.  . Cholecystectomy    . Tonsillectomy and adenoidectomy    . Arm surgery Left    History  Substance Use Topics  . Smoking status: Current Every Day Smoker -- 0.50 packs/day for 40 years    Types: Cigarettes  . Smokeless tobacco: Not on file  . Alcohol Use: Yes     Comment: occassion   Family History  Problem Relation Age of Onset  . Cancer Mother 4    cervical  . CVA Mother   . Hypertension Mother    Allergies  Allergen Reactions  . Shrimp [Shellfish Allergy]     May be seasoning not always   Current Outpatient Prescriptions on File Prior to Visit  Medication Sig Dispense Refill  . albuterol (PROVENTIL HFA;VENTOLIN HFA) 108 (90 BASE) MCG/ACT inhaler Inhale 2 puffs into the lungs every 6 (six) hours as needed for wheezing.  1 Inhaler  0  . aspirin 81 MG tablet Take 81 mg by mouth daily.      Marland Kitchen atenolol (TENORMIN) 100 MG tablet Take 1 tablet (100 mg total) by mouth daily.  90 tablet  3  . cloNIDine (CATAPRES) 0.1 MG tablet Take 1 tablet (0.1 mg total) by mouth daily.  90 tablet  2  . diclofenac (VOLTAREN) 75 MG EC tablet Take 75 mg by mouth 2 (two) times daily.      . hydrochlorothiazide (HYDRODIURIL) 25 MG tablet Take 1 tablet (25 mg total) by mouth daily.  90 tablet  2  . potassium chloride (K-DUR) 10 MEQ tablet Take 1 tablet (10 mEq total) by mouth daily.  90 tablet  2   No current facility-administered medications on file prior to visit.   The PMH, PSH, Social History, Family History, Medications, and allergies have been reviewed in [facility], and have been updated if relevant.    Review of Systems See HPI No CP, HA or SOB    Objective:   Physical Exam  Wt Readings from Last 3 Encounters:  03/16/13 247 lb 4 oz (112.152 kg)  02/11/13 254 lb (115.214 kg)  12/23/12 257 lb 8 oz (116.801 kg)     General:  Obese, well-nourished,in no acute distress; alert,appropriate and cooperative throughout examination Head:  normocephalic and atraumatic.   Eyes:  vision grossly intact, pupils equal, pupils round, and pupils reactive to light.   Ears:  R ear normal and L ear normal.   Nose:  no external deformity.   Mouth:  good dentition.   Neck:  No deformities, masses, or tenderness noted. Heart:  Normal rate and regular rhythm. S1 and S2  normal without gallop, murmur, click, rub or other extra sounds. Msk:  No deformity or scoliosis noted of thoracic or lumbar spine.   Extremities:  No clubbing, cyanosis, edema, or deformity noted with normal full range of motion of all joints.   Neurologic:  alert & oriented X3 and gait normal.   Skin:  Intact without suspicious lesions or rashes Psych:  Cognition and judgment appear intact. Alert and cooperative with normal attention span and concentration. No apparent delusions, illusions, hallucinations      Assessment & Plan:

## 2013-03-17 ENCOUNTER — Telehealth: Payer: Self-pay | Admitting: Family Medicine

## 2013-03-17 NOTE — Telephone Encounter (Signed)
Relevant patient education assigned to patient using Emmi. ° °

## 2013-04-06 ENCOUNTER — Encounter: Payer: Self-pay | Admitting: Family Medicine

## 2013-04-06 ENCOUNTER — Other Ambulatory Visit (INDEPENDENT_AMBULATORY_CARE_PROVIDER_SITE_OTHER): Payer: Medicare HMO

## 2013-04-06 DIAGNOSIS — D72829 Elevated white blood cell count, unspecified: Secondary | ICD-10-CM

## 2013-04-06 LAB — CBC WITH DIFFERENTIAL/PLATELET
Basophils Absolute: 0.1 10*3/uL (ref 0.0–0.1)
Basophils Relative: 0.5 % (ref 0.0–3.0)
Eosinophils Absolute: 0.6 10*3/uL (ref 0.0–0.7)
Eosinophils Relative: 5.2 % — ABNORMAL HIGH (ref 0.0–5.0)
HCT: 43.3 % (ref 36.0–46.0)
Hemoglobin: 13.9 g/dL (ref 12.0–15.0)
Lymphocytes Relative: 38.3 % (ref 12.0–46.0)
Lymphs Abs: 4.2 10*3/uL — ABNORMAL HIGH (ref 0.7–4.0)
MCHC: 32 g/dL (ref 30.0–36.0)
MCV: 90.1 fl (ref 78.0–100.0)
Monocytes Absolute: 0.8 10*3/uL (ref 0.1–1.0)
Monocytes Relative: 7.4 % (ref 3.0–12.0)
Neutro Abs: 5.4 10*3/uL (ref 1.4–7.7)
Neutrophils Relative %: 48.6 % (ref 43.0–77.0)
Platelets: 229 10*3/uL (ref 150.0–400.0)
RBC: 4.81 Mil/uL (ref 3.87–5.11)
RDW: 14.7 % — ABNORMAL HIGH (ref 11.5–14.6)
WBC: 11 10*3/uL — ABNORMAL HIGH (ref 4.5–10.5)

## 2013-11-26 ENCOUNTER — Other Ambulatory Visit: Payer: Self-pay | Admitting: Specialist

## 2013-11-26 DIAGNOSIS — M545 Low back pain, unspecified: Secondary | ICD-10-CM

## 2013-12-02 ENCOUNTER — Other Ambulatory Visit: Payer: Commercial Managed Care - HMO

## 2013-12-04 ENCOUNTER — Ambulatory Visit
Admission: RE | Admit: 2013-12-04 | Discharge: 2013-12-04 | Disposition: A | Discharge status: Home / Self Care | Payer: Commercial Managed Care - HMO | Source: Ambulatory Visit | Attending: Specialist | Admitting: Specialist

## 2013-12-04 DIAGNOSIS — M545 Low back pain, unspecified: Secondary | ICD-10-CM

## 2013-12-10 ENCOUNTER — Other Ambulatory Visit: Payer: Self-pay

## 2013-12-10 ENCOUNTER — Other Ambulatory Visit: Payer: Self-pay | Admitting: Physician Assistant

## 2013-12-10 DIAGNOSIS — I10 Essential (primary) hypertension: Secondary | ICD-10-CM

## 2013-12-10 MED ORDER — CLONIDINE HCL 0.1 MG PO TABS: 0.1000 mg | ORAL_TABLET | Freq: Every day | ORAL | Status: DC

## 2013-12-10 MED ORDER — POTASSIUM CHLORIDE ER 10 MEQ PO TBCR: 10.0000 meq | EXTENDED_RELEASE_TABLET | Freq: Every day | ORAL | Status: DC

## 2013-12-10 MED ORDER — ATENOLOL 100 MG PO TABS: 100.0000 mg | ORAL_TABLET | Freq: Every day | ORAL | Status: DC

## 2013-12-10 MED ORDER — HYDROCHLOROTHIAZIDE 25 MG PO TABS: 25.0000 mg | ORAL_TABLET | Freq: Every day | ORAL | Status: DC

## 2013-12-10 NOTE — Telephone Encounter (Signed)
Pt request refill atenolol, clonidine, HCTZ and Potassium to [facility] mail order; pt states no longer seeing cardiologist and was advised by card's office to ck with PCP. Pt last seen 03/16/13.Please advise. Pt request cb when refilled.

## 2013-12-16 ENCOUNTER — Telehealth: Payer: Self-pay | Admitting: Family Medicine

## 2013-12-16 NOTE — Telephone Encounter (Signed)
i have not attempted to contact this pt and there are no recent notes in her chart

## 2013-12-16 NOTE — Telephone Encounter (Signed)
Patient returned your call.

## 2013-12-16 NOTE — Telephone Encounter (Signed)
On October 8th there is a refill request.  It asked for you to call patient back to notify her when the rx was called in.

## 2013-12-30 ENCOUNTER — Telehealth: Payer: Self-pay | Admitting: Cardiovascular Disease

## 2013-12-30 NOTE — Telephone Encounter (Signed)
Received request from Nurse fax box, documents faxed for surgical clearance. To: [employer] number: [phone] Attention: [phone]/km

## 2014-01-01 ENCOUNTER — Ambulatory Visit (INDEPENDENT_AMBULATORY_CARE_PROVIDER_SITE_OTHER): Payer: Commercial Managed Care - HMO | Admitting: Internal Medicine

## 2014-01-01 ENCOUNTER — Encounter: Payer: Self-pay | Admitting: Internal Medicine

## 2014-01-01 VITALS — BP 132/68 | HR 62 | Temp 98.2°F | Wt 237.0 lb

## 2014-01-01 DIAGNOSIS — J209 Acute bronchitis, unspecified: Secondary | ICD-10-CM

## 2014-01-01 DIAGNOSIS — R059 Cough, unspecified: Secondary | ICD-10-CM

## 2014-01-01 DIAGNOSIS — R05 Cough: Secondary | ICD-10-CM

## 2014-01-01 MED ORDER — AZITHROMYCIN 250 MG PO TABS: ORAL_TABLET | ORAL | Status: DC

## 2014-01-01 MED ORDER — HYDROCODONE-HOMATROPINE 5-1.5 MG/5ML PO SYRP: 5.0000 mL | ORAL_SOLUTION | Freq: Three times a day (TID) | ORAL | Status: DC | PRN

## 2014-01-01 NOTE — Progress Notes (Signed)
Pre visit review using our clinic review tool, if applicable. No additional management support is needed unless otherwise documented below in the visit note. 

## 2014-01-01 NOTE — Patient Instructions (Signed)
Cough, Adult  A cough is a reflex that helps clear your throat and airways. It can help heal the body or may be a reaction to an irritated airway. A cough may only last 2 or 3 weeks (acute) or may last more than 8 weeks (chronic).  CAUSES Acute cough:  Viral or bacterial infections. Chronic cough:  Infections.  Allergies.  Asthma.  Post-nasal drip.  Smoking.  Heartburn or acid reflux.  Some medicines.  Chronic lung problems (COPD).  Cancer. SYMPTOMS   Cough.  Fever.  Chest pain.  Increased breathing rate.  High-pitched whistling sound when breathing (wheezing).  Colored mucus that you cough up (sputum). TREATMENT   A bacterial cough may be treated with antibiotic medicine.  A viral cough must run its course and will not respond to antibiotics.  Your caregiver may recommend other treatments if you have a chronic cough. HOME CARE INSTRUCTIONS   Only take over-the-counter or prescription medicines for pain, discomfort, or fever as directed by your caregiver. Use cough suppressants only as directed by your caregiver.  Use a cold steam vaporizer or humidifier in your bedroom or home to help loosen secretions.  Sleep in a semi-upright position if your cough is worse at night.  Rest as needed.  Stop smoking if you smoke. SEEK IMMEDIATE MEDICAL CARE IF:   You have pus in your sputum.  Your cough starts to worsen.  You cannot control your cough with suppressants and are losing sleep.  You begin coughing up blood.  You have difficulty breathing.  You develop pain which is getting worse or is uncontrolled with medicine.  You have a fever. MAKE SURE YOU:   Understand these instructions.  Will watch your condition.  Will get help right away if you are not doing well or get worse. Document Released: 08/18/2010 Document Revised: 05/14/2011 Document Reviewed: 08/18/2010 ExitCare Patient Information 2015 [employer]. This information is not intended  to replace advice given to you by your health care provider. Make sure you discuss any questions you have with your health care provider.  
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
## 2014-01-11 ENCOUNTER — Other Ambulatory Visit (HOSPITAL_COMMUNITY): Payer: Self-pay | Admitting: Specialist

## 2014-01-21 ENCOUNTER — Encounter (HOSPITAL_COMMUNITY): Payer: Self-pay

## 2014-01-21 ENCOUNTER — Encounter (HOSPITAL_COMMUNITY)
Admission: RE | Admit: 2014-01-21 | Discharge: 2014-01-21 | Disposition: A | Discharge status: Home / Self Care | Payer: Commercial Managed Care - HMO | Source: Ambulatory Visit | Attending: Specialist | Admitting: Specialist

## 2014-01-21 DIAGNOSIS — Z01818 Encounter for other preprocedural examination: Secondary | ICD-10-CM | POA: Insufficient documentation

## 2014-01-21 DIAGNOSIS — I10 Essential (primary) hypertension: Secondary | ICD-10-CM | POA: Insufficient documentation

## 2014-01-21 HISTORY — DX: Gastro-esophageal reflux disease without esophagitis: K21.9

## 2014-01-21 LAB — URINALYSIS, ROUTINE W REFLEX MICROSCOPIC
Bilirubin Urine: NEGATIVE
Glucose, UA: NEGATIVE mg/dL
Ketones, ur: NEGATIVE mg/dL
Leukocytes, UA: NEGATIVE
Nitrite: NEGATIVE
Protein, ur: NEGATIVE mg/dL
Specific Gravity, Urine: 1.019 (ref 1.005–1.030)
Urobilinogen, UA: 0.2 mg/dL (ref 0.0–1.0)
pH: 6 (ref 5.0–8.0)

## 2014-01-21 LAB — CBC
HCT: 43.8 % (ref 36.0–46.0)
Hemoglobin: 14.6 g/dL (ref 12.0–15.0)
MCH: 29.6 pg (ref 26.0–34.0)
MCHC: 33.3 g/dL (ref 30.0–36.0)
MCV: 88.8 fL (ref 78.0–100.0)
Platelets: 217 10*3/uL (ref 150–400)
RBC: 4.93 MIL/uL (ref 3.87–5.11)
RDW: 14.8 % (ref 11.5–15.5)
WBC: 9.5 10*3/uL (ref 4.0–10.5)

## 2014-01-21 LAB — URINE MICROSCOPIC-ADD ON

## 2014-01-21 LAB — COMPREHENSIVE METABOLIC PANEL
ALT: 10 U/L (ref 0–35)
AST: 14 U/L (ref 0–37)
Albumin: 3.4 g/dL — ABNORMAL LOW (ref 3.5–5.2)
Alkaline Phosphatase: 79 U/L (ref 39–117)
Anion gap: 14 (ref 5–15)
BUN: 14 mg/dL (ref 6–23)
CO2: 23 mEq/L (ref 19–32)
Calcium: 9.7 mg/dL (ref 8.4–10.5)
Chloride: 105 mEq/L (ref 96–112)
Creatinine, Ser: 0.93 mg/dL (ref 0.50–1.10)
GFR calc Af Amer: 79 mL/min — ABNORMAL LOW (ref >90)
GFR calc non Af Amer: 68 mL/min — ABNORMAL LOW (ref >90)
Glucose, Bld: 86 mg/dL (ref 70–99)
Potassium: 4 mEq/L (ref 3.7–5.3)
Sodium: 142 mEq/L (ref 137–147)
Total Bilirubin: 0.6 mg/dL (ref 0.3–1.2)
Total Protein: 7.5 g/dL (ref 6.0–8.3)

## 2014-01-21 LAB — SURGICAL PCR SCREEN
MRSA, PCR: NEGATIVE
Staphylococcus aureus: NEGATIVE

## 2014-01-21 LAB — ABO/RH: ABO/RH(D): AB POS

## 2014-01-21 NOTE — Pre-Procedure Instructions (Signed)
Kathy Herrera  01/21/2014   Your procedure is scheduled on:  Tuesday, Dec. 1st   Report to [facility] Admitting at 9:30 AM.  Call this number if you have problems the morning of surgery: [phone]   Remember:   Do not eat food or drink liquids after midnight Monday.   Take these medicines the morning of surgery with A SIP OF WATER: Tenormin, Gabapentin, Hydrocodone. Please inhaler the morning of surgery.   Do not wear jewelry, make-up or nail polish.  Do not wear lotions, powders, or perfumes. You may NOT wear deodorant the morning of surgery.  Do not shave underarms & legs 48 hours prior to surgery.    Do not bring valuables to the hospital.  [facility] is not responsible for any belongings or valuables.               Contacts, dentures or bridgework may not be worn into surgery.  Leave suitcase in the car. After surgery it may be brought to your room.  For patients admitted to the hospital, discharge time is determined by your treatment team.    Name and phone number of your driver:    Special Instructions: "Preparing for Surgery" instruction sheet.   Please read over the following fact sheets that you were given: Pain Booklet, Coughing and Deep Breathing, Blood Transfusion Information, MRSA Information and Surgical Site Infection Prevention

## 2014-01-21 NOTE — Progress Notes (Signed)
   01/21/14 0817  OBSTRUCTIVE SLEEP APNEA  Have you ever been diagnosed with sleep apnea through a sleep study? No  Do you snore loudly (loud enough to be heard through closed doors)?  0  Do you often feel tired, fatigued, or sleepy during the daytime? 1 (mainly d/t medication)  Has anyone observed you stop breathing during your sleep? 0  Do you have, or are you being treated for high blood pressure? 1  BMI more than 35 kg/m2? 1  Age over 54 years old? 1  Neck circumference greater than 40 cm/16 inches? 1  Gender: 0  Obstructive Sleep Apnea Score 5  Score 4 or greater  Results sent to PCP

## 2014-01-21 NOTE — Progress Notes (Signed)
Received a call from blood bank...additional blood needed to research and examine for new antibodies.  I was told by blood bank, that new sample needs to be drawn within 3 days of surgery.   I have called the patient and explained that, per blood bank, she can remove the blue armband, but will definitely need to be here on the 30th of Nov. For redraw.  Patient states she will be here @ 8:01 on the 30th.  I have explained situation to E. Deatra Canter, Rn and the pre-admit lab personnel.  DA

## 2014-01-21 NOTE — Progress Notes (Addendum)
She was sent to cardio by Pcp mainly for her HTN.  She saw Dr. Acie Fredrickson, and has not seen him in a yr, nor does she have any heart issues currently.   Cardiac clearance note inside chart.

## 2014-01-27 NOTE — H&P (Signed)
Kathy Herrera is an 53 y.o. female.    CHIEF COMPLAINT:  Low back pain.   HISTORY OF PRESENT ILLNESS:  The patient is a 53 year old female.  She returns today for followup of her back.  She is having persistent pain into her lower back with radiation into both lower extremities.  Complains of some numbness and paresthesias in both legs on the lateral aspect of the legs and posteriorly into her feet.  This is worse with standing and ambulation, improves some when she squats or if she sits.  Then it returns when she tries to stand again.  She was seen over a year ago and evaluated.  Felt to have findings consistent with neurogenic claudication associated with spondylolisthesis at L4-5 with severe spinal stenosis.  Marland Kitchen   RADIOGRAPHS:  She also had radiographs that suggested a spondylolisthesis at the L3-4 level.  Her MRI scan was old and we saw her recently back again after a prolonged period of being out.  She relates that she wishes to go ahead with the intervention that was planned for last year.  We repeated her MRI scan.  Also had x-rays done with flexion and extension views of the lumbar spine. The radiologist reading her radiographs suggested that the spondylolisthesis at L3-4 is not significant.  It does appear to be very minor, 1 or 2 mm.  MRI scan of her lumbar spine shows minimal lateral recess narrowing at L3-4, severe lumbar spinal stenosis at L4-5 with lateral recess entrapment and neural foraminal entrapment at the L4-5 level.  Grade 1, nearly grade 2 spondylolisthesis at L4-5.  L5-S1 with severe degenerative disc narrowing with neural foraminal narrowing as well.  Decreased interpedicular distance and decreased distance between the disc and the facet joints posteriorly. Her findings, her history and her studies are consistent with neurogenic claudication on the basis of severe lumbar spinal stenosis at L4-5 with spondylolisthesis at L4-5.  Neural foraminal entrapment at L5-S1 due to spinal  stenosis.  Mild stenosis at L3-4 without a significant slip. Single radiograph of the cervical spine was obtained today, as her previous radiographs from last month of the cervical spine did not show adequately the lower cervical segments at C6, C7 and T1.  The radiograph today shows degenerative disc narrowing at C6-7 and C7-T1.  Large anterior spurs at the C7- T1 level.  No fracture, dislocation or subluxation seen on this single lateral radiograph with swimmer's view at the lower cervical segment.   Discussed with Arletta, plan would be to go ahead and perform intervention in the form of a 2-level TLIF at L4-5 and L5-S1.  The degree of slip at L3-4 is judged to be very minimal.  Will avoid any attempt at decompression or fusion at this segment.  Plan to maintain the surgery then at the lower 2 segments with TLIFs being done as well as posterior pedicle screw and rod instrumentation using local bone graft.  The risks of surgery including risks of infection, bleeding, and neurologic compromise discussed with Asiya and she wishes to proceed with surgical intervention.  Past Medical History  Diagnosis Date  . DJD (degenerative joint disease) of lumbar spine   . Heart murmur   . Hypertension   . Hyperlipidemia   . GERD (gastroesophageal reflux disease)     not everyday and uses tums when needed    Past Surgical History  Procedure Laterality Date  . Abdominal hysterectomy      partial- still has cervix.  . Cholecystectomy    .  Tonsillectomy and adenoidectomy    . Arm surgery Left     Family History  Problem Relation Age of Onset  . Cancer Mother 44    cervical  . CVA Mother   . Hypertension Mother    Social History:  reports that she has been smoking Cigarettes.  She has a 20 pack-year smoking history. She does not have any smokeless tobacco history on file. She reports that she drinks alcohol. She reports that she does not use illicit drugs.  Allergies:  Allergies  Allergen Reactions   . Shrimp [Shellfish Allergy] Hives    May be seasoning not always    No prescriptions prior to admission    No results found for this or any previous visit (from the past 48 hour(s)). No results found.  Review of Systems  Musculoskeletal: Positive for back pain and neck pain.  All other systems reviewed and are negative.   There were no vitals taken for this visit. Physical Exam  Constitutional: She is oriented to person, place, and time. She appears well-developed and well-nourished.  HENT:  Head: Normocephalic and atraumatic.  Eyes: EOM are normal. Pupils are equal, round, and reactive to light.  Neck: Normal range of motion. Neck supple.  Cardiovascular: Normal rate and regular rhythm.   Respiratory: Effort normal and breath sounds normal.  GI: Soft.  Musculoskeletal:    Sciatic tension tests are negative.  Motor shows EHL weakness on both sides.  Foot dorsiflexion weakness is 5-/5.  EHL weakness is 4/5 on both sides.  Extension of the neck is decreased by 30%.  Lateral bending and rotation minimally decreased, maybe 10% to both sides.  Forward bending, chin to sternum. Marland Kitchen  Her reflexes are symmetric at the biceps, triceps and brachial radialis, 2+ and symmetric throughout, at the knee 1+, at the ankle 0 and symmetric on both sides.    Neurological: She is alert and oriented to person, place, and time.  Skin: Skin is warm and dry.     Assessment/Plan Lumbar spondylolisthesis at L4-5, spinal stenosis at L4-5 and L5-S1, severe degenerative disc disease at L5-S1 and L4-5.  Decompression L4-5 and L5-S1.  TLIFs L4-5 and L5-S1.  Local bone graft instrumentation with pedicle screws and rods.  TLIFs will be done with a Concord-type cage.    VERNON,SHEILA M 01/27/2014, 11:06 AM

## 2014-02-01 ENCOUNTER — Encounter (HOSPITAL_COMMUNITY)
Admission: RE | Admit: 2014-02-01 | Discharge: 2014-02-01 | Disposition: A | Discharge status: Home / Self Care | Payer: Medicare PPO | Source: Ambulatory Visit | Attending: Specialist | Admitting: Specialist

## 2014-02-01 LAB — TYPE AND SCREEN
ABO/RH(D): AB POS
Antibody Screen: NEGATIVE
PT AG Type: NEGATIVE

## 2014-02-01 MED ORDER — CEFAZOLIN SODIUM 10 G IJ SOLR
3.0000 g | INTRAMUSCULAR | Status: AC
  Administered 2014-02-02 (×2): 3 g via INTRAVENOUS
  Filled 2014-02-01: qty 3000

## 2014-02-02 ENCOUNTER — Inpatient Hospital Stay (HOSPITAL_COMMUNITY): Payer: Medicare PPO

## 2014-02-02 ENCOUNTER — Inpatient Hospital Stay (HOSPITAL_COMMUNITY): Payer: Medicare PPO | Admitting: Anesthesiology

## 2014-02-02 ENCOUNTER — Encounter (HOSPITAL_COMMUNITY): Payer: Self-pay | Admitting: Anesthesiology

## 2014-02-02 ENCOUNTER — Inpatient Hospital Stay (HOSPITAL_COMMUNITY)
Admission: RE | Admit: 2014-02-02 | Discharge: 2014-02-05 | DRG: 460 | Disposition: A | Discharge status: Skilled Nursing Facility | Payer: Medicare PPO | Source: Ambulatory Visit | Attending: Specialist | Admitting: Specialist

## 2014-02-02 ENCOUNTER — Encounter (HOSPITAL_COMMUNITY)
Admission: RE | Disposition: A | Discharge status: Skilled Nursing Facility | Payer: Commercial Managed Care - HMO | Source: Ambulatory Visit | Attending: Specialist

## 2014-02-02 ENCOUNTER — Inpatient Hospital Stay (HOSPITAL_COMMUNITY): Payer: Medicare PPO | Admitting: Vascular Surgery

## 2014-02-02 DIAGNOSIS — M4806 Spinal stenosis, lumbar region: Secondary | ICD-10-CM | POA: Diagnosis present

## 2014-02-02 DIAGNOSIS — Z6837 Body mass index (BMI) 37.0-37.9, adult: Secondary | ICD-10-CM | POA: Diagnosis not present

## 2014-02-02 DIAGNOSIS — G9741 Accidental puncture or laceration of dura during a procedure: Secondary | ICD-10-CM | POA: Diagnosis not present

## 2014-02-02 DIAGNOSIS — Z01812 Encounter for preprocedural laboratory examination: Secondary | ICD-10-CM

## 2014-02-02 DIAGNOSIS — F17210 Nicotine dependence, cigarettes, uncomplicated: Secondary | ICD-10-CM | POA: Diagnosis present

## 2014-02-02 DIAGNOSIS — I10 Essential (primary) hypertension: Secondary | ICD-10-CM | POA: Diagnosis present

## 2014-02-02 DIAGNOSIS — M51369 Other intervertebral disc degeneration, lumbar region without mention of lumbar back pain or lower extremity pain: Secondary | ICD-10-CM | POA: Diagnosis present

## 2014-02-02 DIAGNOSIS — M48062 Spinal stenosis, lumbar region with neurogenic claudication: Secondary | ICD-10-CM | POA: Diagnosis present

## 2014-02-02 DIAGNOSIS — M5137 Other intervertebral disc degeneration, lumbosacral region: Secondary | ICD-10-CM | POA: Diagnosis present

## 2014-02-02 DIAGNOSIS — Y831 Surgical operation with implant of artificial internal device as the cause of abnormal reaction of the patient, or of later complication, without mention of misadventure at the time of the procedure: Secondary | ICD-10-CM | POA: Diagnosis not present

## 2014-02-02 DIAGNOSIS — M4316 Spondylolisthesis, lumbar region: Secondary | ICD-10-CM | POA: Diagnosis present

## 2014-02-02 DIAGNOSIS — M545 Low back pain: Secondary | ICD-10-CM | POA: Diagnosis present

## 2014-02-02 DIAGNOSIS — E785 Hyperlipidemia, unspecified: Secondary | ICD-10-CM | POA: Diagnosis present

## 2014-02-02 DIAGNOSIS — Y92234 Operating room of hospital as the place of occurrence of the external cause: Secondary | ICD-10-CM | POA: Diagnosis not present

## 2014-02-02 DIAGNOSIS — M5136 Other intervertebral disc degeneration, lumbar region: Secondary | ICD-10-CM

## 2014-02-02 HISTORY — PX: LUMBAR FUSION: SHX111

## 2014-02-02 SURGERY — FUSION, SPINE, LUMBAR, MINIMALLY INVASIVE
Anesthesia: General

## 2014-02-02 MED ORDER — HYDROCODONE-ACETAMINOPHEN 5-325 MG PO TABS
1.0000 | ORAL_TABLET | ORAL | Status: DC | PRN
  Administered 2014-02-05: 1 via ORAL
  Filled 2014-02-02: qty 1

## 2014-02-02 MED ORDER — NEOSTIGMINE METHYLSULFATE 10 MG/10ML IV SOLN
INTRAVENOUS | Status: AC
  Filled 2014-02-02: qty 1

## 2014-02-02 MED ORDER — HYDROCHLOROTHIAZIDE 25 MG PO TABS
25.0000 mg | ORAL_TABLET | Freq: Every day | ORAL | Status: DC
  Administered 2014-02-02 – 2014-02-03 (×2): 25 mg via ORAL
  Filled 2014-02-02 (×3): qty 1

## 2014-02-02 MED ORDER — HYDROMORPHONE HCL 1 MG/ML IJ SOLN
INTRAMUSCULAR | Status: AC
  Filled 2014-02-02: qty 1

## 2014-02-02 MED ORDER — KETOROLAC TROMETHAMINE 30 MG/ML IJ SOLN
INTRAMUSCULAR | Status: AC
  Filled 2014-02-02: qty 1

## 2014-02-02 MED ORDER — SODIUM CHLORIDE 0.9 % IV SOLN
10.0000 mg | INTRAVENOUS | Status: DC | PRN
  Administered 2014-02-02: 20 ug/min via INTRAVENOUS

## 2014-02-02 MED ORDER — ONDANSETRON HCL 4 MG/2ML IJ SOLN
4.0000 mg | INTRAMUSCULAR | Status: DC | PRN
  Administered 2014-02-02 – 2014-02-04 (×3): 4 mg via INTRAVENOUS
  Filled 2014-02-02 (×3): qty 2

## 2014-02-02 MED ORDER — CLONIDINE HCL 0.1 MG PO TABS
0.1000 mg | ORAL_TABLET | Freq: Every day | ORAL | Status: DC
  Administered 2014-02-02 – 2014-02-04 (×2): 0.1 mg via ORAL
  Filled 2014-02-02 (×4): qty 1

## 2014-02-02 MED ORDER — NEOSTIGMINE METHYLSULFATE 10 MG/10ML IV SOLN
INTRAVENOUS | Status: DC | PRN
  Administered 2014-02-02: 5 mg via INTRAVENOUS

## 2014-02-02 MED ORDER — POTASSIUM CHLORIDE ER 10 MEQ PO TBCR
10.0000 meq | EXTENDED_RELEASE_TABLET | Freq: Every day | ORAL | Status: DC
  Administered 2014-02-02 – 2014-02-05 (×4): 10 meq via ORAL
  Filled 2014-02-02 (×4): qty 1

## 2014-02-02 MED ORDER — POTASSIUM CHLORIDE IN NACL 20-0.9 MEQ/L-% IV SOLN
INTRAVENOUS | Status: DC
  Administered 2014-02-02: via INTRAVENOUS
  Filled 2014-02-02 (×10): qty 1000

## 2014-02-02 MED ORDER — ACETAMINOPHEN 325 MG PO TABS: 650.0000 mg | ORAL_TABLET | ORAL | Status: DC | PRN

## 2014-02-02 MED ORDER — SODIUM CHLORIDE 0.9 % IJ SOLN
INTRAMUSCULAR | Status: AC
  Filled 2014-02-02: qty 10

## 2014-02-02 MED ORDER — SENNA 8.6 MG PO TABS
1.0000 | ORAL_TABLET | Freq: Two times a day (BID) | ORAL | Status: DC
  Administered 2014-02-02 – 2014-02-05 (×6): 8.6 mg via ORAL
  Filled 2014-02-02 (×7): qty 1

## 2014-02-02 MED ORDER — EPHEDRINE SULFATE 50 MG/ML IJ SOLN
INTRAMUSCULAR | Status: DC | PRN
  Administered 2014-02-02: 5 mg via INTRAVENOUS
  Administered 2014-02-02: 10 mg via INTRAVENOUS
  Administered 2014-02-02: 15 mg via INTRAVENOUS
  Administered 2014-02-02 (×2): 10 mg via INTRAVENOUS

## 2014-02-02 MED ORDER — ACETAMINOPHEN 650 MG RE SUPP: 650.0000 mg | RECTAL | Status: DC | PRN

## 2014-02-02 MED ORDER — CHLORHEXIDINE GLUCONATE 4 % EX LIQD
60.0000 mL | Freq: Once | CUTANEOUS | Status: DC
  Filled 2014-02-02: qty 60

## 2014-02-02 MED ORDER — KETOROLAC TROMETHAMINE 30 MG/ML IJ SOLN
30.0000 mg | Freq: Once | INTRAMUSCULAR | Status: AC
  Administered 2014-02-02: 30 mg via INTRAVENOUS

## 2014-02-02 MED ORDER — STERILE WATER FOR INJECTION IJ SOLN
INTRAMUSCULAR | Status: AC
  Filled 2014-02-02: qty 10

## 2014-02-02 MED ORDER — ROCURONIUM BROMIDE 100 MG/10ML IV SOLN
INTRAVENOUS | Status: DC | PRN
  Administered 2014-02-02: 50 mg via INTRAVENOUS

## 2014-02-02 MED ORDER — SODIUM CHLORIDE 0.9 % IJ SOLN
3.0000 mL | Freq: Two times a day (BID) | INTRAMUSCULAR | Status: DC
  Administered 2014-02-03 – 2014-02-05 (×5): 3 mL via INTRAVENOUS

## 2014-02-02 MED ORDER — MENTHOL 3 MG MT LOZG: 1.0000 | LOZENGE | OROMUCOSAL | Status: DC | PRN

## 2014-02-02 MED ORDER — LACTATED RINGERS IV SOLN
INTRAVENOUS | Status: DC
  Administered 2014-02-02: 10:00:00 via INTRAVENOUS

## 2014-02-02 MED ORDER — LACTATED RINGERS IV SOLN
INTRAVENOUS | Status: DC | PRN
  Administered 2014-02-02 (×2): via INTRAVENOUS

## 2014-02-02 MED ORDER — DOCUSATE SODIUM 100 MG PO CAPS
100.0000 mg | ORAL_CAPSULE | Freq: Two times a day (BID) | ORAL | Status: DC
  Administered 2014-02-02 – 2014-02-05 (×6): 100 mg via ORAL
  Filled 2014-02-02 (×6): qty 1

## 2014-02-02 MED ORDER — THROMBIN 20000 UNITS EX SOLR
CUTANEOUS | Status: DC | PRN
  Administered 2014-02-02: 20 mL via TOPICAL

## 2014-02-02 MED ORDER — HEMOSTATIC AGENTS (NO CHARGE) OPTIME
TOPICAL | Status: DC | PRN
  Administered 2014-02-02: 1 via TOPICAL

## 2014-02-02 MED ORDER — ASPIRIN EC 81 MG PO TBEC
81.0000 mg | DELAYED_RELEASE_TABLET | Freq: Every day | ORAL | Status: DC
  Administered 2014-02-02 – 2014-02-05 (×4): 81 mg via ORAL
  Filled 2014-02-02 (×4): qty 1

## 2014-02-02 MED ORDER — BISACODYL 10 MG RE SUPP
10.0000 mg | Freq: Every day | RECTAL | Status: DC | PRN
Start: 2014-02-02 — End: 2014-02-05

## 2014-02-02 MED ORDER — OXYCODONE HCL 5 MG PO TABS
5.0000 mg | ORAL_TABLET | Freq: Once | ORAL | Status: AC | PRN
  Administered 2014-02-02: 5 mg via ORAL

## 2014-02-02 MED ORDER — SODIUM CHLORIDE 0.9 % IV SOLN: 250.0000 mL | INTRAVENOUS | Status: DC

## 2014-02-02 MED ORDER — OXYCODONE HCL 5 MG PO TABS
ORAL_TABLET | ORAL | Status: AC
  Administered 2014-02-02: 5 mg via ORAL
  Filled 2014-02-02: qty 1

## 2014-02-02 MED ORDER — VECURONIUM BROMIDE 10 MG IV SOLR
INTRAVENOUS | Status: DC | PRN
  Administered 2014-02-02 (×3): 2 mg via INTRAVENOUS

## 2014-02-02 MED ORDER — SUCCINYLCHOLINE CHLORIDE 20 MG/ML IJ SOLN
INTRAMUSCULAR | Status: DC | PRN
  Administered 2014-02-02: 100 mg via INTRAVENOUS

## 2014-02-02 MED ORDER — PANTOPRAZOLE SODIUM 40 MG IV SOLR
40.0000 mg | Freq: Every day | INTRAVENOUS | Status: DC
  Administered 2014-02-02: 40 mg via INTRAVENOUS
  Filled 2014-02-02 (×2): qty 40

## 2014-02-02 MED ORDER — ONDANSETRON HCL 4 MG/2ML IJ SOLN
INTRAMUSCULAR | Status: DC | PRN
  Administered 2014-02-02: 4 mg via INTRAVENOUS

## 2014-02-02 MED ORDER — ALBUTEROL SULFATE (2.5 MG/3ML) 0.083% IN NEBU: 2.5000 mg | INHALATION_SOLUTION | Freq: Four times a day (QID) | RESPIRATORY_TRACT | Status: DC | PRN

## 2014-02-02 MED ORDER — METHOCARBAMOL 1000 MG/10ML IJ SOLN
500.0000 mg | Freq: Four times a day (QID) | INTRAVENOUS | Status: DC | PRN
  Filled 2014-02-02: qty 5

## 2014-02-02 MED ORDER — POLYETHYLENE GLYCOL 3350 17 G PO PACK: 17.0000 g | PACK | Freq: Every day | ORAL | Status: DC | PRN

## 2014-02-02 MED ORDER — METHOCARBAMOL 500 MG PO TABS
500.0000 mg | ORAL_TABLET | Freq: Four times a day (QID) | ORAL | Status: DC | PRN
  Administered 2014-02-02 – 2014-02-05 (×8): 500 mg via ORAL
  Filled 2014-02-02 (×8): qty 1

## 2014-02-02 MED ORDER — HYDROMORPHONE HCL 1 MG/ML IJ SOLN
0.2500 mg | INTRAMUSCULAR | Status: DC | PRN
Start: 2014-02-02 — End: 2014-02-02
  Administered 2014-02-02 (×3): 0.5 mg via INTRAVENOUS

## 2014-02-02 MED ORDER — SODIUM CHLORIDE 0.9 % IV SOLN
INTRAVENOUS | Status: DC | PRN
  Administered 2014-02-02: 17:00:00 via INTRAVENOUS

## 2014-02-02 MED ORDER — ZOLPIDEM TARTRATE 5 MG PO TABS: 5.0000 mg | ORAL_TABLET | Freq: Every evening | ORAL | Status: DC | PRN

## 2014-02-02 MED ORDER — GLYCOPYRROLATE 0.2 MG/ML IJ SOLN
INTRAMUSCULAR | Status: DC | PRN
  Administered 2014-02-02: .2 mg via INTRAVENOUS
  Administered 2014-02-02: 1 mg via INTRAVENOUS

## 2014-02-02 MED ORDER — CEFAZOLIN SODIUM 1-5 GM-% IV SOLN
INTRAVENOUS | Status: AC
  Filled 2014-02-02: qty 50

## 2014-02-02 MED ORDER — PHENYLEPHRINE HCL 10 MG/ML IJ SOLN
INTRAMUSCULAR | Status: DC | PRN
  Administered 2014-02-02: 80 ug via INTRAVENOUS
  Administered 2014-02-02 (×2): 40 ug via INTRAVENOUS
  Administered 2014-02-02 (×2): 80 ug via INTRAVENOUS

## 2014-02-02 MED ORDER — EPHEDRINE SULFATE 50 MG/ML IJ SOLN
INTRAMUSCULAR | Status: AC
  Filled 2014-02-02: qty 1

## 2014-02-02 MED ORDER — GLYCOPYRROLATE 0.2 MG/ML IJ SOLN
INTRAMUSCULAR | Status: AC
  Filled 2014-02-02: qty 1

## 2014-02-02 MED ORDER — ALBUMIN HUMAN 5 % IV SOLN
INTRAVENOUS | Status: DC | PRN
  Administered 2014-02-02: 13:00:00 via INTRAVENOUS

## 2014-02-02 MED ORDER — SODIUM CHLORIDE 0.9 % IJ SOLN
3.0000 mL | INTRAMUSCULAR | Status: DC | PRN
  Administered 2014-02-04: 3 mL via INTRAVENOUS
  Filled 2014-02-02: qty 3

## 2014-02-02 MED ORDER — THROMBIN 20000 UNITS EX SOLR
CUTANEOUS | Status: AC
  Filled 2014-02-02: qty 20000

## 2014-02-02 MED ORDER — PHENOL 1.4 % MT LIQD: 1.0000 | OROMUCOSAL | Status: DC | PRN

## 2014-02-02 MED ORDER — GABAPENTIN 100 MG PO CAPS
100.0000 mg | ORAL_CAPSULE | Freq: Every day | ORAL | Status: DC
  Administered 2014-02-03 – 2014-02-05 (×3): 100 mg via ORAL
  Filled 2014-02-02 (×3): qty 1

## 2014-02-02 MED ORDER — ATENOLOL 100 MG PO TABS
100.0000 mg | ORAL_TABLET | Freq: Every day | ORAL | Status: DC
  Administered 2014-02-03 – 2014-02-04 (×2): 100 mg via ORAL
  Filled 2014-02-02 (×3): qty 1

## 2014-02-02 MED ORDER — OXYCODONE-ACETAMINOPHEN 5-325 MG PO TABS
1.0000 | ORAL_TABLET | ORAL | Status: DC | PRN
  Administered 2014-02-02: 1 via ORAL
  Administered 2014-02-03 – 2014-02-05 (×9): 2 via ORAL
  Filled 2014-02-02 (×12): qty 2

## 2014-02-02 MED ORDER — HYDROMORPHONE HCL 1 MG/ML IJ SOLN
INTRAMUSCULAR | Status: AC
  Administered 2014-02-02: 0.5 mg via INTRAVENOUS
  Filled 2014-02-02: qty 1

## 2014-02-02 MED ORDER — PROPOFOL 10 MG/ML IV BOLUS
INTRAVENOUS | Status: DC | PRN
  Administered 2014-02-02: 200 mg via INTRAVENOUS

## 2014-02-02 MED ORDER — ARTIFICIAL TEARS OP OINT
TOPICAL_OINTMENT | OPHTHALMIC | Status: DC | PRN
  Administered 2014-02-02: 1 via OPHTHALMIC

## 2014-02-02 MED ORDER — BUPIVACAINE LIPOSOME 1.3 % IJ SUSP
20.0000 mL | INTRAMUSCULAR | Status: DC
  Filled 2014-02-02: qty 20

## 2014-02-02 MED ORDER — BUPIVACAINE LIPOSOME 1.3 % IJ SUSP
INTRAMUSCULAR | Status: DC | PRN
  Administered 2014-02-02: 20 mL
  Administered 2014-02-02: 15 mL

## 2014-02-02 MED ORDER — GLYCOPYRROLATE 0.2 MG/ML IJ SOLN
INTRAMUSCULAR | Status: AC
  Filled 2014-02-02: qty 5

## 2014-02-02 MED ORDER — METHOCARBAMOL 500 MG PO TABS
ORAL_TABLET | ORAL | Status: AC
  Administered 2014-02-02: 500 mg via ORAL
  Filled 2014-02-02: qty 1

## 2014-02-02 MED ORDER — BUPIVACAINE-EPINEPHRINE (PF) 0.5% -1:200000 IJ SOLN
INTRAMUSCULAR | Status: AC
  Filled 2014-02-02: qty 30

## 2014-02-02 MED ORDER — MIDAZOLAM HCL 2 MG/2ML IJ SOLN
INTRAMUSCULAR | Status: AC
  Filled 2014-02-02: qty 2

## 2014-02-02 MED ORDER — MORPHINE SULFATE 2 MG/ML IJ SOLN
1.0000 mg | INTRAMUSCULAR | Status: DC | PRN
  Administered 2014-02-02: 1 mg via INTRAVENOUS
  Administered 2014-02-03 – 2014-02-05 (×6): 2 mg via INTRAVENOUS
  Filled 2014-02-02 (×7): qty 1

## 2014-02-02 MED ORDER — FENTANYL CITRATE 0.05 MG/ML IJ SOLN
INTRAMUSCULAR | Status: AC
  Filled 2014-02-02: qty 5

## 2014-02-02 MED ORDER — VECURONIUM BROMIDE 10 MG IV SOLR
INTRAVENOUS | Status: AC
  Filled 2014-02-02: qty 10

## 2014-02-02 MED ORDER — BUPIVACAINE HCL 0.5 % IJ SOLN
INTRAMUSCULAR | Status: DC | PRN
  Administered 2014-02-02: 30 mL

## 2014-02-02 MED ORDER — CEFAZOLIN SODIUM 1-5 GM-% IV SOLN
1.0000 g | Freq: Three times a day (TID) | INTRAVENOUS | Status: AC
  Administered 2014-02-02 – 2014-02-03 (×2): 1 g via INTRAVENOUS
  Filled 2014-02-02 (×3): qty 50

## 2014-02-02 MED ORDER — LISINOPRIL 40 MG PO TABS
40.0000 mg | ORAL_TABLET | Freq: Every day | ORAL | Status: DC
Start: 2014-02-02 — End: 2014-02-05
  Administered 2014-02-02 – 2014-02-04 (×2): 40 mg via ORAL
  Filled 2014-02-02 (×4): qty 1

## 2014-02-02 MED ORDER — 0.9 % SODIUM CHLORIDE (POUR BTL) OPTIME
TOPICAL | Status: DC | PRN
  Administered 2014-02-02: 1000 mL

## 2014-02-02 MED ORDER — ALBUTEROL SULFATE HFA 108 (90 BASE) MCG/ACT IN AERS: 2.0000 | INHALATION_SPRAY | Freq: Four times a day (QID) | RESPIRATORY_TRACT | Status: DC | PRN

## 2014-02-02 MED ORDER — PROPOFOL 10 MG/ML IV BOLUS
INTRAVENOUS | Status: AC
  Filled 2014-02-02: qty 20

## 2014-02-02 MED ORDER — MIDAZOLAM HCL 5 MG/5ML IJ SOLN
INTRAMUSCULAR | Status: DC | PRN
  Administered 2014-02-02: 2 mg via INTRAVENOUS

## 2014-02-02 MED ORDER — LACTATED RINGERS IV SOLN
INTRAVENOUS | Status: DC | PRN
  Administered 2014-02-02 (×2): via INTRAVENOUS

## 2014-02-02 MED ORDER — ALUM & MAG HYDROXIDE-SIMETH 200-200-20 MG/5ML PO SUSP
30.0000 mL | Freq: Four times a day (QID) | ORAL | Status: DC | PRN
  Administered 2014-02-03: 30 mL via ORAL
  Filled 2014-02-02: qty 30

## 2014-02-02 MED ORDER — PROMETHAZINE HCL 25 MG/ML IJ SOLN: 6.2500 mg | INTRAMUSCULAR | Status: DC | PRN

## 2014-02-02 MED ORDER — FENTANYL CITRATE 0.05 MG/ML IJ SOLN
INTRAMUSCULAR | Status: DC | PRN
  Administered 2014-02-02 (×4): 50 ug via INTRAVENOUS
  Administered 2014-02-02: 100 ug via INTRAVENOUS

## 2014-02-02 MED ORDER — LIDOCAINE HCL (CARDIAC) 20 MG/ML IV SOLN
INTRAVENOUS | Status: DC | PRN
  Administered 2014-02-02: 100 mg via INTRAVENOUS

## 2014-02-02 MED ORDER — OXYCODONE HCL 5 MG/5ML PO SOLN: 5.0000 mg | Freq: Once | ORAL | Status: AC | PRN

## 2014-02-02 SURGICAL SUPPLY — 70 items
BLADE SURG ROTATE 9660 (MISCELLANEOUS) IMPLANT
BUR NEURO DRILL SOFT 3.0X3.8M (BURR) ×2 IMPLANT
BUR ROUND FLUTED 4 SOFT TCH (BURR) ×2 IMPLANT
CAGE BULLET CONCORDE 9X10X27 (Cage) ×2 IMPLANT
CAGE CONCORDE BULLET 9X9X27 (Cage) ×2 IMPLANT
CONNECTOR CROSS SFX A2 (Connector) ×2 IMPLANT
CORDS BIPOLAR (ELECTRODE) ×2 IMPLANT
COVER MAYO STAND STRL (DRAPES) ×4 IMPLANT
COVER SURGICAL LIGHT HANDLE (MISCELLANEOUS) ×2 IMPLANT
DERMABOND ADVANCED (GAUZE/BANDAGES/DRESSINGS) ×1
DERMABOND ADVANCED .7 DNX12 (GAUZE/BANDAGES/DRESSINGS) ×1 IMPLANT
DRAPE C-ARM 42X72 X-RAY (DRAPES) ×2 IMPLANT
DRAPE C-ARMOR (DRAPES) ×2 IMPLANT
DRAPE SURG 17X23 STRL (DRAPES) ×8 IMPLANT
DRAPE TABLE COVER HEAVY DUTY (DRAPES) ×2 IMPLANT
DRSG MEPILEX BORDER 4X4 (GAUZE/BANDAGES/DRESSINGS) IMPLANT
DRSG MEPILEX BORDER 4X8 (GAUZE/BANDAGES/DRESSINGS) ×2 IMPLANT
DURAPREP 26ML APPLICATOR (WOUND CARE) ×2 IMPLANT
DURASEAL SPINE SEALANT 3ML (MISCELLANEOUS) ×2 IMPLANT
ELECT BLADE 6.5 EXT (BLADE) ×2 IMPLANT
ELECT CAUTERY BLADE 6.4 (BLADE) ×2 IMPLANT
ELECT REM PT RETURN 9FT ADLT (ELECTROSURGICAL) ×2
ELECTRODE REM PT RTRN 9FT ADLT (ELECTROSURGICAL) ×1 IMPLANT
EVACUATOR 1/8 PVC DRAIN (DRAIN) IMPLANT
GAUZE SPONGE 4X4 12PLY STRL (GAUZE/BANDAGES/DRESSINGS) ×2 IMPLANT
GLOVE BIOGEL PI IND STRL 7.5 (GLOVE) ×1 IMPLANT
GLOVE BIOGEL PI INDICATOR 7.5 (GLOVE) ×1
GLOVE ECLIPSE 7.0 STRL STRAW (GLOVE) ×2 IMPLANT
GLOVE ECLIPSE 9.0 STRL (GLOVE) ×2 IMPLANT
GLOVE SURG 8.5 LATEX PF (GLOVE) ×2 IMPLANT
GOWN SPEC L3 MED W/TWL (GOWN DISPOSABLE) ×2 IMPLANT
GOWN STRL REUS W/ TWL LRG LVL3 (GOWN DISPOSABLE) ×2 IMPLANT
GOWN STRL REUS W/TWL 2XL LVL3 (GOWN DISPOSABLE) ×2 IMPLANT
GOWN STRL REUS W/TWL LRG LVL3 (GOWN DISPOSABLE) ×2
HEMOSTAT SURGICEL 2X14 (HEMOSTASIS) IMPLANT
KIT BASIN OR (CUSTOM PROCEDURE TRAY) ×2 IMPLANT
KIT POSITION SURG JACKSON T1 (MISCELLANEOUS) ×2 IMPLANT
KIT ROOM TURNOVER OR (KITS) ×2 IMPLANT
LIQUID BAND (GAUZE/BANDAGES/DRESSINGS) ×2 IMPLANT
NEEDLE 22X1 1/2 (OR ONLY) (NEEDLE) ×2 IMPLANT
NEEDLE ASP BONE MRW 11GX15 (NEEDLE) IMPLANT
NEEDLE BONE MARROW 8GAX6 (NEEDLE) IMPLANT
NEEDLE SPNL 18GX3.5 QUINCKE PK (NEEDLE) ×2 IMPLANT
NS IRRIG 1000ML POUR BTL (IV SOLUTION) ×2 IMPLANT
PACK LAMINECTOMY ORTHO (CUSTOM PROCEDURE TRAY) ×2 IMPLANT
PAD ARMBOARD 7.5X6 YLW CONV (MISCELLANEOUS) ×4 IMPLANT
PATTIES SURGICAL .75X.75 (GAUZE/BANDAGES/DRESSINGS) ×2 IMPLANT
PATTIES SURGICAL 1X1 (DISPOSABLE) ×2 IMPLANT
ROD EXPEDIUM PREBENT 5.5X30MM (Rod) ×4 IMPLANT
SCREW SET SINGLE INNER (Screw) ×12 IMPLANT
SCREW VIPER CORT FIX 5.00X35 (Screw) ×4 IMPLANT
SCREW VIPER CORT FIX 6.00X30 (Screw) ×4 IMPLANT
SCREW VIPER CORT FIX 6X35 (Screw) ×4 IMPLANT
SURGIFLO W/THROMBIN 8M KIT (HEMOSTASIS) ×2 IMPLANT
SUT NURALON 4 0 TR CR/8 (SUTURE) ×2 IMPLANT
SUT VIC AB 0 CT1 27 (SUTURE) ×1
SUT VIC AB 0 CT1 27XBRD ANBCTR (SUTURE) ×1 IMPLANT
SUT VIC AB 1 CTX 36 (SUTURE) ×2
SUT VIC AB 1 CTX36XBRD ANBCTR (SUTURE) ×2 IMPLANT
SUT VIC AB 2-0 CT1 27 (SUTURE) ×2
SUT VIC AB 2-0 CT1 TAPERPNT 27 (SUTURE) ×2 IMPLANT
SUT VICRYL 0 CT 1 36IN (SUTURE) ×2 IMPLANT
SUT VICRYL 4-0 PS2 18IN ABS (SUTURE) ×4 IMPLANT
SYR 20CC LL (SYRINGE) ×2 IMPLANT
SYR CONTROL 10ML LL (SYRINGE) ×4 IMPLANT
TOWEL OR 17X24 6PK STRL BLUE (TOWEL DISPOSABLE) ×2 IMPLANT
TOWEL OR 17X26 10 PK STRL BLUE (TOWEL DISPOSABLE) ×2 IMPLANT
TRAY FOLEY CATH 16FRSI W/METER (SET/KITS/TRAYS/PACK) ×2 IMPLANT
WATER STERILE IRR 1000ML POUR (IV SOLUTION) IMPLANT
YANKAUER SUCT BULB TIP NO VENT (SUCTIONS) ×2 IMPLANT

## 2014-02-02 NOTE — Brief Op Note (Signed)
02/02/2014  5:46 PM  PATIENT:  [place]  53 y.o. female  PRE-OPERATIVE DIAGNOSIS:  L4-5, L5-S1 spinal stenosis, severe degenerative disc disease, degenerative spondylolisthesis L4-5, foraminal stenosis L5-S1  POST-OPERATIVE DIAGNOSIS:  L4-5, L5-S1 spinal stenosis, severe degenerative disc disease, degenerative spondylolisthesis L4-5, foraminal stenosis L5-S1  PROCEDURE:  Procedure(s): CENTRAL LAMINECTOMY L4-5,L5-S1, TRANSFORAMINAL LUMBAR INTERBODY FUSION L4-5,L5-S1, RODS AND SCREW FIX  AND L5-S1, LOCAL BONE GRAFT (N/A)  SURGEON:  Surgeon(s) and Role: Jessy Oto, MD - Primary  PHYSICIAN ASSISTANT: Phillips Hay, PA-C  ANESTHESIA:   local and general  EBL:  Total I/O In: 4799 [I.V.:2100; Blood:220; IV Piggyback:250] Out: 900 [Urine:300; Other:100; Blood:500]  BLOOD ADMINISTERED:220 CC CELLSAVER  DRAINS: Urinary Catheter (Foley)   LOCAL MEDICATIONS USED:  MARCAINE 0.5% 1:1 EXPAREL 1.3%   , Amount: 40 ml  SPECIMEN:  No Specimen  DISPOSITION OF SPECIMEN:  N/A  COUNTS:  YES  TOURNIQUET:  * No tourniquets in log *  DICTATION: .Dragon Dictation  PLAN OF CARE: Admit to inpatient   PATIENT DISPOSITION:  PACU - hemodynamically stable.   Delay start of Pharmacological VTE agent (>24hrs) due to surgical blood loss or risk of bleeding: yes

## 2014-02-02 NOTE — Anesthesia Preprocedure Evaluation (Addendum)
Anesthesia Evaluation  Patient identified by MRN, date of birth, ID band Patient awake    Reviewed: Allergy & Precautions, H&P , NPO status , Patient's Chart, lab work & pertinent test results  Airway Mallampati: I  TM Distance: >3 FB Neck ROM: Full    Dental  (+) Teeth Intact, Dental Advisory Given   Pulmonary asthma , Current Smoker,  breath sounds clear to auscultation        Cardiovascular hypertension, Pt. on medications and Pt. on home beta blockers Rhythm:Regular Rate:Normal  2014 TTE: EF 65%. Normal valves. Mildly dilated LA.   Neuro/Psych negative neurological ROS  negative psych ROS   GI/Hepatic Neg liver ROS, GERD-  ,  Endo/Other  Morbid obesity  Renal/GU negative Renal ROS     Musculoskeletal  (+) Arthritis -,   Abdominal   Peds  Hematology negative hematology ROS (+)   Anesthesia Other Findings Broken tooth front   Reproductive/Obstetrics                           Anesthesia Physical Anesthesia Plan  ASA: III  Anesthesia Plan: General   Post-op Pain Management:    Induction: Intravenous  Airway Management Planned: Oral ETT  Additional Equipment:   Intra-op Plan:   Post-operative Plan: Extubation in OR  Informed Consent: I have reviewed the patients History and Physical, chart, labs and discussed the procedure including the risks, benefits and alternatives for the proposed anesthesia with the patient or authorized representative who has indicated his/her understanding and acceptance.   Dental advisory given and Dental Advisory Given  Plan Discussed with: CRNA and Surgeon  Anesthesia Plan Comments:        Anesthesia Quick Evaluation

## 2014-02-02 NOTE — Anesthesia Procedure Notes (Signed)
Procedure Name: Intubation Date/Time: 02/02/2014 11:44 AM Performed by: Neldon Newport Pre-anesthesia Checklist: Patient identified, Timeout performed, Emergency Drugs available, Suction available and Patient being monitored Patient Re-evaluated:Patient Re-evaluated prior to inductionOxygen Delivery Method: Circle system utilized Preoxygenation: Pre-oxygenation with 100% oxygen Intubation Type: IV induction and Rapid sequence Ventilation: Mask ventilation without difficulty Laryngoscope Size: Mac and 3 Grade View: Grade I Tube type: Oral Tube size: 7.5 mm Number of attempts: 1 Placement Confirmation: positive ETCO2,  ETT inserted through vocal cords under direct vision and breath sounds checked- equal and bilateral Secured at: 22 cm Tube secured with: Tape Dental Injury: Teeth and Oropharynx as per pre-operative assessment

## 2014-02-02 NOTE — Anesthesia Postprocedure Evaluation (Signed)
  Anesthesia Post-op Note  Patient: Kathy Herrera  Procedure(s) Performed: Procedure(s): CENTRAL LAMINECTOMY L4-5,L5-S1, TRANSFORAMINAL LUMBAR INTERBODY FUSION L4-5,L5-S1, RODS AND SCREW FIX  AND L5-S1, LOCAL BONE GRAFT (N/A)  Patient Location: PACU  Anesthesia Type:General  Level of Consciousness: awake, alert , oriented and patient cooperative  Airway and Oxygen Therapy: Patient Spontanous Breathing and Patient connected to nasal cannula oxygen  Post-op Pain: mild  Post-op Assessment: Post-op Vital signs reviewed, Patient's Cardiovascular Status Stable, Respiratory Function Stable, Patent Airway, No signs of Nausea or vomiting and Pain level controlled  Post-op Vital Signs: Reviewed and stable  Last Vitals:  Filed Vitals:   02/02/14 1937  BP:   Pulse: 84  Temp:   Resp: 18    Complications: No apparent anesthesia complications

## 2014-02-02 NOTE — Progress Notes (Signed)
Dr Louanne Skye at bedside- OK to elevate HOB 15 degrees.

## 2014-02-02 NOTE — Interval H&P Note (Signed)
History and Physical Interval Note:  02/02/2014 11:31 AM  Kathy Herrera  has presented today for surgery, with the diagnosis of L4-5, L5-S1 spinal stenosis, severe degenerative disc disease, degenerative spondylolisthesis L4-5, foraminal stenosis L5-S1  The various methods of treatment have been discussed with the patient and family. After consideration of risks, benefits and other options for treatment, the patient has consented to  Procedure(s): CENTRAL LAMINECTOMY L4-5,L5-S1, TRANSFORAMINAL LUMBAR INTERBODY FUSION L4-5,L5-S1, RODS AND SCREW FIXATION L4-5 AND L5-S1, LOCAL BONE GRAFT (N/A) as a surgical intervention .  The patient's history has been reviewed, patient examined, no change in status, stable for surgery.  I have reviewed the patient's chart and labs.  Questions were answered to the patient's satisfaction.     [name]

## 2014-02-02 NOTE — Discharge Instructions (Addendum)
°  Call if there is increasing drainage, fever greater than 101.5, severe head aches, and worsening nausea or light sensitivity. If shortness of breath, bloody cough or chest tightness or pain go to an emergency room. No lifting greater than 5 lbs. Avoid bending, stooping and twisting. Use brace when sitting and out of bed even to go to bathroom. Walk in house for first 2 weeks then may start to get out slowly increasing distances up to one mile by 4-6 weeks post op. After 5 days may shower and change dressing following bathing with shower.When bathing remove the brace shower and replace brace before getting out of the shower. If drainage, keep dry dressing and do not bathe the incision, use an moisture impervious dressing. Please call and return for scheduled follow up appointment 2 weeks from the time of surgery.

## 2014-02-02 NOTE — Transfer of Care (Signed)
Immediate Anesthesia Transfer of Care Note  Patient: Kathy Herrera  Procedure(s) Performed: Procedure(s): CENTRAL LAMINECTOMY L4-5,L5-S1, TRANSFORAMINAL LUMBAR INTERBODY FUSION L4-5,L5-S1, RODS AND SCREW FIX  AND L5-S1, LOCAL BONE GRAFT (N/A)  Patient Location: PACU  Anesthesia Type:General  Level of Consciousness: awake  Airway & Oxygen Therapy: Patient Spontanous Breathing and Patient connected to nasal cannula oxygen  Post-op Assessment: Report given to PACU RN, Post -op Vital signs reviewed and stable and Patient moving all extremities  Post vital signs: Reviewed and stable  Complications: No apparent anesthesia complications

## 2014-02-02 NOTE — Op Note (Signed)
02/02/2014  6:13 PM  PATIENT:  Kathy Herrera  53 y.o. female  MRN: [id number]  OPERATIVE REPORT  PRE-OPERATIVE DIAGNOSIS:  L4-5, L5-S1 spinal stenosis, severe degenerative disc disease, degenerative spondylolisthesis L4-5, foraminal stenosis L5-S1  POST-OPERATIVE DIAGNOSIS:  L4-5, L5-S1 spinal stenosis, severe degenerative disc disease, degenerative spondylolisthesis L4-5, foraminal stenosis L5-S1  PROCEDURE:  Procedure(s): CENTRAL LAMINECTOMY L4-5,L5-S1, TRANSFORAMINAL LUMBAR INTERBODY FUSION L4-5,L5-S1, RODS AND SCREW FIX  AND L5-S1, LOCAL BONE GRAFT    SURGEON:  Jessy Oto, MD     ASSISTANT:  Phillips Hay, PA-C  (Present throughout the entire procedure and necessary for completion of procedure in a timely manner)     ANESTHESIA:  General,    COMPLICATIONS:  Small dural tear 2 mm left L4-5 lateral dura, with placement of T-PAL cage, Cage removed and replaced with concord lordotic cage.  DRAINS: Foley to SD.  EBL:  500cc  CELL SAVER BLOOD RETURNED: 220cc      COMPONENTS  Implant Name Type Inv. Item Serial No. Manufacturer Lot No. LRB No. Used  SCREW VIPER CORT FIX 5.00X35 - [id number] Screw SCREW VIPER CORT FIX 5.00X35  DEPUY SPINE  N/A 2  SCREW VIPER CORT FIX 4.35X21 - [id number] Screw SCREW VIPER CORT FIX 4.35X21  DEPUY SPINE  N/A 2  SCREW VIPER CORT FIX 6.00X30 - [id number] Screw SCREW VIPER CORT FIX 6.00X30  DEPUY SPINE  N/A 2  Expedium Rod    DEPUY SPINE  N/A 2  SCREW SET SINGLE INNER - [id number] Screw SCREW SET SINGLE INNER  DEPUY SPINE  N/A 2  SCREW SET SINGLE INNER - [id number] Screw SCREW SET SINGLE INNER  DEPUY SPINE  N/A 4  CAGE CONCORDE BULLET 9X9X27 - [id number] Cage CAGE CONCORDE BULLET 9X9X27  DEPUY SPINE  N/A 1  CAGE BULLET CONCORDE 9X10X27MM - [id number] Cage CAGE BULLET CONCORDE 9X10X27MM  DEPUY SPINE  N/A 1  CONNECTOR CROSS SFX A2 - [id number] Connector CONNECTOR CROSS SFX A2   DEPUY SPINE   N/A 1    PROCEDURE: The patient was met in the holding area,  and the appropriate lumbar levels L4-5 and L5-S1 identified and marked with an "X" and my initials. I had discussion with the patient in the preop holding area regarding a change of consent form.The fusion levels are reidentified as L4-5 and L5-S1. Patient understands the rationale to perform TLIFs at two levels to decompress the central L4-5 and rightL5-S1 lateral recess and foramenal stenosis.The patient was then transported to OR and was placed under general anestheticwithout difficulty. The patient received appropriate preoperative antibiotic prophylaxis 3 gm Ancef and vancomycin for positive nasal swab for MRSA. Nursing staff inserted a Foley catheter under sterile conditions. The patient was then turned to a prone position using the [place] spine frame. PAS. all pressure points well padded the arms at the side to 90 90. Standard prep with DuraPrep solution draped in the usual manner from the lower dorsal spine the mid sacral segment. Iodine Vi-Drape was used and the incision was marked. Time-out procedure was called and correct. Skin in the midline between L3 and S2 was then infiltrated with local anesthesia, marcaine 1/2% 1:1 exparel 1.3% total 15 cc used. Incision was then made extending from L3-S2 through the skin and subcutaneous layers down to the patient's lumbodorsal fascia and spinous processes. The incision then carried sharply excising the supraspinous ligament and then continuing the lateral aspect of the spinous processes of L3, L4, L5 and S1. Cobb elevator used to carefully elevate the  paralumbar muscles off of the posterior elements using electrocautery carefully drilled bleeding and perform dissection of the muscle tissues of the preserving the facet capsule at the L3-4. Continuing the exposure out laterally to expose the lateral margin of the facet joint line at L3-4, L4-5 and L5-S1. Incision was carried in the midline down to the S1 level area bleeders controlled using electrocautery  monopolar electrocautery.  Clamps were then placed at the L4-5 level and also at the L5-S1 interspinous process level observed on lateral radiograph the upper clamp was at the L4-5 interspinous process space. Spinous processes of L4 and L5 marked with OR marking pen sterilely Viper self retaining retractor was used for the upper part of the incision and the cerebellar retractor caudle. C-arm fluoroscopy was then brought into the field and using C-arm fluoroscopy then a hole made into the medial aspect of the pedicle of L4 observed in the pedicle using C arm at the 7 oclock position on the right L4 pedicle nerve probe initial entry was determined on fluoroscopy to be good position alignment so that a 4.41m tap was passed to 30 mm within the right L4 pedicle to a depth of nearly 35 mm observed on C-arm fluoroscopy to be beyond the midpoint of the lumbar vertebra and then position alignment within the right L4 pedicle this was then removed and the pedicle channel probed demonstrating patency no sign of rupture the cortex of the pedicle. Tapping with a 4 mm screw tap then a 5.0 mm tap a 5.0 mm x 35 mm screw was placed in the right pedicle.  C-arm fluoroscopy was then brought into the field and using C-arm fluoroscopy then a hole made into the posterior medial aspect of the pedicle of left L4 (5 o'clock position) observed in the pedicle using ball tipped nerve hook and hockey stick nerve probe initial entry was determined on fluoroscopy to be good position alignment so that 4.062mtap was then used to tap the right L4 pedicle to a depth of nearly 30 mm observed on C-arm fluoroscopy to be beyond the midpoint of the lumbar vertebra and then position alignment within the right L4 pedicle this was then removed and the pedicle channel probed demonstrating loss of cortical patency the cortex of the pedicle. Measurement of the depth to loss of lateral cortex measured 32 mm. Tapping with a 5 mm screw tap then 4.0 mm x 35 mm  screw was chosen to be placed later placed on the left side L4 pedicle following decompression and TLIF. C-arm fluoroscopy was then brought into the field and using C-arm fluoroscopy then a hole made into the posterior and medial aspect of the right pedicle of L5 observed in the pedicle using ball tipped nerve hook and hockey stick nerve probe initial entry was determined on fluoroscopy to be good position alignment so that a 4.89m54map was then used to tap the left L5 pedicle to a depth of nearly 30 mm observed on C-arm fluoroscopy to be beyond the posterior one third of the lumbar vertebra and good position alignment within the right L5 pedicle this was then removed and the pedicle channel probed demonstrating patency no sign of rupture the cortex of the pedicle. Tapping with a 5 mm screw tap then a 6 mm tap then 6.89mm25m35 mm screw was placed the right side at the L5. The pedicle channel of L5 on the right probed demonstrating patency no sign of rupture the cortex of the pedicle. Viper screw for  fixation of this level was measured as 6.0 mm x 35 mm screw. C-arm fluoroscopy was then brought into the field and using C-arm fluoroscopy then a hole made into the posterior and medial aspect of the left pedicle of L5 observed in the pedicle using ball tipped nerve hook and hockey stick nerve probe initial entry was determined on fluoroscopy to be good position alignment so that a 4.64m tap was then used to tap the left L5 pedicle to a depth of nearly 30 mm observed on C-arm fluoroscopy to be beyond the posterior one third of the lumbar vertebra and good position alignment within the left L5 pedicle this was then removed and the pedicle channel probed demonstrating patency no sign of rupture the cortex of the pedicle. Tapping with a 5 mm screw tap then a 6 mm tap then 6.032mx 30 mm screw was placed on the left side at the L5 level. The pedicle channel of L5 on the left probed demonstrating patency no sign of rupture the  cortex of the pedicle. Viper screw for fixation of this level was measured as 6.0 mm x 30 mm screw left on the table for insertion after decompression and TLIF. C-arm fluoroscopy was then brought into the field and using C-arm fluoroscopy then the hole into the pedicle of right S1 was placed and observed with ball-tipped probe the S1 pedicle on this side was 6.0 mm x 35 mm. A 6.16m26m 48m46mrefully passed down the center of the S1 pedicle to a depth of nearly 35 mm. Observed on C-arm fluoroscopy to be in good position alignment channel was probed with a ball-tipped probe ensure patency no sign of cortical disruption. Following tapping with a 5 mm tap then a 6 mm tap and a 6.0 x 35 mm screw was placed in the right side pedicle at S1. C-arm fluoroscopy was used to localize the hole made in the medial aspect of the pedicle of S1 on the leftt localizing the pedicle within the spinal canal with nerve hook and hockey-stick nerve probe carefully passed down the center of the S1 pedicle to a depth of nearly 30 mm. Observed on C-arm fluoroscopy to be in good position alignment channel was probed with a ball-tipped probe ensure predicle screw to be placed on the left side at the S1 level following decompression and TLIFs..C-arm fluoroscopy was used to localize the hole made in the lateral aspect of the pedicle of S1 on the left localizing the pedicle within the spinal canal with nerve hook and hockey-stick nerve probe re patency no sign of cortical disruption. Following tapping with a 4 mm, 5mm 57m 6 mm taps a 6.0 x 30 mm screw was placed on the table to be inserted following decompression and TLIF.   Inferior half of spinous processes of L4 and entire L5 were then resected down to the base the lamina at each segment the upper 50% of the spinous process of S1 was resected and Leksell rongeur used to resect inferior aspect of the lamina on the left side at the L4 level and partially on the right side at L4 The rightt  medial 40% of the facet of L4-5 and L5-S1 were resected in order to decompress the right side of the lumbar thecal sac at L4-5 and L5-S1 and decompress the righ L4 and L5 and S1 neuroforamen. Osteotomes and 2mm a316m3mm ke63msons were used for this portion of the decompression. Similarly the left side decompression was carried out but complete  facetectomies were perform on the left at L4-5 and L5-S1 to provide for exposure of the left side L4-5 and L5-S1 neuroforamen for ease of placement of TLIFs (transforaminal lumbar interbody fusion) at each level inferior portions of the lamina and pars were also resected first beginning with the Leksell rongeur and osteotomes and then resecting using 2 and 3 mm Kerrison. Continued laminectomy was carried out resecting the central portions of the lamina of L4 and L5 performing foraminotomies on the left side at the L4, L5 and S1levels. The inferior articular process L4 and L5 were resected on the left side. The L5 nerve root identified bilaterally and the medial aspect of the L5 pedicle. Superior articular process of L5 was then resected from the left side further decompressing the left L4 and L5 nerve roots and providing for exposure of the area just superior to the L5 pedicle for a placement of cage. A large amount of hypertrophic ligmentum flavum was found impressing on the left lateral recesses at L4-5 and L5-S1 and narrowing the respective L4, L5 and S1 neuroforamen. Resection of the left  Medial superior articular process of S1 was performed after performing a foraminotomy left S1.Attention then turned to placement of the transforaminal lumbar interbody fusion cages. Using a Penfield 4 the right lateral aspect of the thecal sac at the L4-5 disc space was carefully freed up The thecal sac could then easily be retracted in the posterior lateral aspect of the L4-5 disc was exposed 15 blade scalpel used to incise the posterolateral disc and an osteotome used to resect a  small portion of bone off the superior aspect of the posterior superior vertebral body of L5 in order to ease the entry into the L4-5 disc space. A 38m kerrison rongeur was then able to be introduced in the disc space debrided it was quite narrow. 7 mm dilator shaver was used to dialate the L4-5 disc space on the right side attempts were made to dilate further the 823m 43m24mhen 10 mm were successful and using small curettes and the disc space was debrided a moderate degenerative disc present in the endplates debrided to bleeding endplate bone. Shavers were inserted to trial the intervertebral disc space. A 10 mm x 39m78mnthes T-PAL cage was carefully packed with morcellized bone graft and the been harvested from previous laminotomies.The cage was then inserted with the articulating insertion handle.While inserting the cage a dural tear noted at the left midline L4 inferior spinous process. The T-PAL cage was removed and the small 2mm 62mal tear repaired with OR microscope using 2x 4-0 neurolon interrupted sutures.  The dural tear represented a  bleb with small dural tear no neural elements involved size noted about 2 mm in length, this was repaired with 2 interrupted 4-0 Neurolon sutures. Valsalva to 40 mm showed no leak. Loupe magnification and headlight were used during this portion procedure. Valsalva to 40 mm Hg demonstrated no CSF leakage.  Additional bone graft was then packed into the intervertebral disc space. A 9 mm by 27 mm lordotic concord bullet cage packed with autogenous local bone graft was placed without difficulty. Bleeding controlled using bipolar electrocautery thrombin soaked gel cottonoids. Then turned to the left L5-S1 level similarly the exposure the posterior lateral aspect this was carried out using a Penfield 4 bipolar electrocautery to control small bleeders present. Derricho retractor used to retract the thecal sac and L5 nerve root a 15 blade scalpel was used to incise posterior  lateral aspect of the  left L5-S1disc the disc space at this level showed a rather severe narrowing posteriorly was more open anteriorly so that an osteotome again was used to resect a small portion the posterior superior lip of the vertebral body at S1 in order to gain ease of access into the L5-S1 disc space. The space was debrided of degenerative disc material using pituitary along root the entire disc space was then debrided of degenerative disc material using pituitary rongeurs curettage down to bleeding bone endplates. 78m, 8 mm and 9365mdialators were used to dialate the disc space and pituitary ronguers used to remove the loosened debris. This space was then carefully assess using spacers a 9.65m39mrial cage provided the best fit, the lordotic concord cage 9mm38m28mm38m chosen so that the permanent 9.0 mm by 28 mm concord lordotic cage cage packed with local bone graft was placed into the intervertebral disc space. The posterior intervertebral disc space was then packed with autogenous local bone graft that been harvested from the central laminectomy. Bleeding controlled using bipolar electrocautery. Observed on C-arm fluoroscopy to be in good position alignment. The cages at L4-5 and L5-S1 were placed anteriorly as best as possible to maintain lumbar lordosis that was present. With this then the transforaminal lumbar interbody fusion portion of the case was completed bleeders were controlled using bipolar electrocautery thrombin-soaked Gelfoam were appropriate.Decortication of the facet joints carried out bilateral L4-5 and L5-S1. These were packed with cancellous local bone graft. 3 pedicle screws on the right already in place, the 3 viper corticofixation screws on the left were each placed and then each fastener carefully aligned to allow for placement of rods. The left side first quarter inch titanium rod precontoured 65mm 38mwas then placed into the pedicle screws on the left extending from L4-S1  each of the caps were carefully placed loosely tightened. Attention turned to the right side were similarly placement of fixation of the rod a quarter inch 65 mm precontoured titanium rod was inserted into the right pedicle screw fasteners was carried out, Caps onto the L4 fasteners were tightened to 80 foot lbs. Across the left side L4-5 and L5-S1 screw fasteners compression was obtained and fasteners tightened to 80 foot lbs the right side between L4 and L5, then L5 andS1 by compressing between the fasteners and tightening the screw caps 85 ft pounds. Similarly this was done on the right side at L4-5 and L5-S1 obtaining compression and tightened 85 pounds. Irrigation was carried out with copious amounts of saline solution this was done throughout the case. Cell Saver was used during the case. A single cross-link for the Depuy expedium system was placed at the L4-5 level measured with the measuring tool and the appropriate A2 cross-link was then carefully contoured and applied to the rods and tightened again to 80 foot-pounds bilaterally using the appropriate torque screwdriver. Center screw on the cross-link was then carefully tightened 80 foot-pounds irrigation was carried out. Hockey stick neuroprobe was used to probe the neuroforamen bilateral L4, L5 and S1, these were determined to be well decompressed. Permanent C-arm images were obtained in AP and lateral plane and oblique planes. Remaining local bone graft was then applied along both lateral posterior lateral region extending from L4 toS1 facet beds.Gelfoam was then removed spinal canal. Duraseal used to seal the Area of dural repair Central L4 and left L4-5 lateral recess.The lumbodorsal musculature carefully exam debrided of any devitalized tissue following removal of Viper retractors were the bleeders were controlled using  electrocautery and the area dorsal lumbar muscle were then approximated in the midline with interrupted #1 Vicryl sutures loose  the dorsal fascia was reattached to the spinous process of L3 to superiorly and S1 inferiorly this was done with #1 Vicryl sutures. Subcutaneous layers then approximated using interrupted 0 Vicryl sutures and 2-0 Vicryl sutures. Skin was closed with a running subcutaneous stitch of 4-0 Vicryl Dermabond was applied then MedPlex bandage. All instrument and sponge counts were correct. The patient was then returned to a supine position on her bed reactivated extubated and returned to the recovery room in satisfactory condition.   Phillips Hay PA-C perform the duties of assistant surgeon during this case. She was present from the beginning of the case to the end of the case assisting in transfer the patient from his stretcher to the OR table and back to the stretcher at the end of the case. Assisted in careful retraction and suction of the laminectomy site delicate neural structures operating under the operating room microscope. She performed closure of the incision from the fascia to the skin applying the dressing.           Genie Mirabal E  02/02/2014, 6:13 PM

## 2014-02-03 ENCOUNTER — Encounter (HOSPITAL_COMMUNITY): Payer: Self-pay | Admitting: *Deleted

## 2014-02-03 LAB — BASIC METABOLIC PANEL
Anion gap: 14 (ref 5–15)
BUN: 18 mg/dL (ref 6–23)
CO2: 23 mEq/L (ref 19–32)
Calcium: 8.5 mg/dL (ref 8.4–10.5)
Chloride: 103 mEq/L (ref 96–112)
Creatinine, Ser: 1.08 mg/dL (ref 0.50–1.10)
GFR calc Af Amer: 66 mL/min — ABNORMAL LOW (ref >90)
GFR calc non Af Amer: 57 mL/min — ABNORMAL LOW (ref >90)
Glucose, Bld: 98 mg/dL (ref 70–99)
Potassium: 4.1 mEq/L (ref 3.7–5.3)
Sodium: 140 mEq/L (ref 137–147)

## 2014-02-03 LAB — CBC
HCT: 38.5 % (ref 36.0–46.0)
Hemoglobin: 12.7 g/dL (ref 12.0–15.0)
MCH: 29.6 pg (ref 26.0–34.0)
MCHC: 33 g/dL (ref 30.0–36.0)
MCV: 89.7 fL (ref 78.0–100.0)
Platelets: 177 10*3/uL (ref 150–400)
RBC: 4.29 MIL/uL (ref 3.87–5.11)
RDW: 15 % (ref 11.5–15.5)
WBC: 14.6 10*3/uL — ABNORMAL HIGH (ref 4.0–10.5)

## 2014-02-03 MED ORDER — PANTOPRAZOLE SODIUM 40 MG PO TBEC
40.0000 mg | DELAYED_RELEASE_TABLET | Freq: Every day | ORAL | Status: DC
  Administered 2014-02-03 – 2014-02-05 (×3): 40 mg via ORAL
  Filled 2014-02-03 (×3): qty 1

## 2014-02-03 MED ORDER — CETYLPYRIDINIUM CHLORIDE 0.05 % MT LIQD
7.0000 mL | Freq: Two times a day (BID) | OROMUCOSAL | Status: DC
  Administered 2014-02-04 – 2014-02-05 (×3): 7 mL via OROMUCOSAL

## 2014-02-03 MED ORDER — INFLUENZA VAC SPLIT QUAD 0.5 ML IM SUSY
0.5000 mL | PREFILLED_SYRINGE | INTRAMUSCULAR | Status: AC
  Administered 2014-02-05: 0.5 mL via INTRAMUSCULAR
  Filled 2014-02-03: qty 0.5

## 2014-02-03 MED FILL — Sodium Chloride IV Soln 0.9%: INTRAVENOUS | Qty: 2000 | Status: AC

## 2014-02-03 MED FILL — Heparin Sodium (Porcine) Inj 1000 Unit/ML: INTRAMUSCULAR | Qty: 30 | Status: AC

## 2014-02-03 NOTE — Progress Notes (Signed)
Orthopedic Tech Progress Note Patient Details:  [place] 1960-07-28 [id number]  Patient ID: Kathy Herrera, female   DOB: 03/28/1960, 53 y.o.   MRN: [id number] Called in bio-tech brace order; spoke with Ramonita Lab, Jamerius Boeckman 02/03/2014, 9:26 AM

## 2014-02-03 NOTE — Evaluation (Signed)
Physical Therapy Evaluation Patient Details Name: Kathy Herrera MRN: [id number] DOB: [birth year] Today's Date: 02/03/2014   History of Present Illness  53 yo female s/p L4-5 L5-s1 transforaminal lumbar interbody fusion rods and screw fixation, local bone graft PMH: DJD HTN GERD ARM surg   Clinical Impression  Patient presents with decreased independence with mobility due to deficits listed in PT problem list.  She will benefit from skilled PT in the acute setting to maximize independence and allow return home with HHPT and spouse assist.    Follow Up Recommendations Home health PT;Supervision/Assistance - 24 hour    Equipment Recommendations  Rolling walker with 5" wheels    Recommendations for Other Services       Precautions / Restrictions Precautions Precautions: Fall;Back Required Braces or Orthoses: Spinal Brace Spinal Brace: Lumbar corset;Applied in sitting position      Mobility  Bed Mobility Overal bed mobility: Needs Assistance Bed Mobility: Rolling;Sidelying to Sit Rolling: Min assist Sidelying to sit: Mod assist       General bed mobility comments: rail used for rolling and cues, encouragement due to pain; side>sit with mod assist to lift trunk and cues for technique (limited by pain)  Transfers Overall transfer level: Needs assistance Equipment used: Rolling walker (2 wheeled) Transfers: Sit to/from [facility] Sit to Stand: Min assist;+2 physical assistance Stand pivot transfers: Min assist       General transfer comment: lifting assist from bed, cues for relaxation due to pain; pivotal steps to chair with assist for safety/balance; assisted to don brace at edge of bed and tighten in standing by [employer]  Ambulation/Gait                Stairs            Wheelchair Mobility    Modified Rankin (Stroke Patients Only)       Balance Overall balance assessment: Needs assistance         Standing balance  support: Bilateral upper extremity supported Standing balance-Leahy Scale: Poor Standing balance comment: UE assist needed for balance and due to pain                             Pertinent Vitals/Pain Pain Assessment: 0-10 Pain Score: 6  Pain Location: back Pain Descriptors / Indicators: Operative site guarding Pain Intervention(s): Monitored during session    Home Living Family/patient expects to be discharged to:: Private residence Living Arrangements: Spouse/significant other Available Help at Discharge: Family;Available 24 hours/day Type of Home: House Home Access: Stairs to enter Entrance Stairs-Rails: Right Entrance Stairs-Number of Steps: 4 Home Layout: One level Home Equipment: Cane - single point;Shower seat;Other (comment) (handicapped toilet)      Prior Function Level of Independence: Independent with assistive device(s)               Hand Dominance   Dominant Hand: Right    Extremity/Trunk Assessment               Lower Extremity Assessment: RLE deficits/detail;LLE deficits/detail RLE Deficits / Details: AAROM WFL, strength hip flexion limited due to pain; knee extension 4-/5, ankle DF 4+/5 LLE Deficits / Details: AAROM WFL, strength hip flexion limited due to pain; knee extension 4-/5, ankle DF 4+/5; reports numbness/tingling left lateral leg, dorsum and plantar surface of foot     Communication   Communication: No difficulties  Cognition Arousal/Alertness: Awake/alert Behavior During Therapy: WFL for tasks assessed/performed Overall Cognitive  Status: Within Functional Limits for tasks assessed                      General Comments      Exercises        Assessment/Plan    PT Assessment Patient needs continued PT services  PT Diagnosis Difficulty walking;Acute pain   PT Problem List Decreased strength;Decreased activity tolerance;Decreased balance;Decreased mobility;Decreased knowledge of precautions;Decreased  knowledge of use of DME  PT Treatment Interventions DME instruction;Balance training;Gait training;Stair training;Functional mobility training;Patient/family education;Therapeutic activities;Therapeutic exercise   PT Goals (Current goals can be found in the Care Plan section) Acute Rehab PT Goals Patient Stated Goal: To return to independent PT Goal Formulation: With patient Time For Goal Achievement: 02/10/14 Potential to Achieve Goals: Good    Frequency Min 5X/week   Barriers to discharge        Co-evaluation               End of Session Equipment Utilized During Treatment: Gait belt;Back brace Activity Tolerance: Patient limited by pain Patient left: in chair;with call bell/phone within reach Nurse Communication: Patient requests pain meds         Time: 1135-1201 PT Time Calculation (min) (ACUTE ONLY): 26 min   Charges:   PT Evaluation $Initial PT Evaluation Tier I: 1 Procedure PT Treatments $Therapeutic Activity: 8-22 mins   PT G Codes:          Stepehn Eckard,CYNDI 02-11-2014, 1:55 PM [place], [place] Feb 11, 2014

## 2014-02-03 NOTE — Progress Notes (Signed)
Orthopedic Tech Progress Note Patient Details:  [place] [birth year] [id number]  Patient ID: Kathy Herrera, female   DOB: 1960/07/07, 53 y.o.   MRN: [id number] Brace order completed by Warnell Forester, Lavon Bothwell 02/03/2014, 12:01 PM

## 2014-02-03 NOTE — Progress Notes (Signed)
Patient ID: Kathy Herrera [facility], female   DOB: 02-02-1961, 53 y.o.   MRN: [id number] Subjective: 1 Day Post-Op Procedure(s) (LRB): CENTRAL LAMINECTOMY L4-5,L5-S1, TRANSFORAMINAL LUMBAR INTERBODY FUSION L4-5,L5-S1, RODS AND SCREW FIX  AND L5-S1, LOCAL BONE GRAFT (N/A) Awake, alert and oriented x 4.  Helped to move up in bed. Foley to be d/ced today. PT and OT okay to start today with brace. May remove brace while in bed.  Patient reports pain as moderate.    Objective:   VITALS:  Temp:  [97.6 F (36.4 C)-98.4 F (36.9 C)] 98.3 F (36.8 C) (12/02 0518) Pulse Rate:  [69-99] 87 (12/02 0518) Resp:  [15-22] 17 (12/02 0518) BP: (109-147)/(63-90) 114/66 mmHg (12/02 1000) SpO2:  [96 %-100 %] 99 % (12/02 0518) Weight:  [109.77 kg (242 lb)] 109.77 kg (242 lb) (12/01 2030)  Neurologically intact ABD soft Intact pulses distally Dorsiflexion/Plantar flexion intact Incision: dressing C/D/I   LABS  Recent Labs  02/03/14 0630  HGB 12.7  WBC 14.6*  PLT 177    Recent Labs  02/03/14 0630  NA 140  K 4.1  CL 103  CO2 23  BUN 18  CREATININE 1.08  GLUCOSE 98   No results for input(s): LABPT, INR in the last 72 hours.   Assessment/Plan: 1 Day Post-Op Procedure(s) (LRB): CENTRAL LAMINECTOMY L4-5,L5-S1, TRANSFORAMINAL LUMBAR INTERBODY FUSION L4-5,L5-S1, RODS AND SCREW FIX  AND L5-S1, LOCAL BONE GRAFT (N/A)  Advance diet Up with therapy Dural tear small expect she can startt PT today monitor  For any signs of spinal headache or photophobia Pedram Goodchild E 02/03/2014, 11:00 AM

## 2014-02-03 NOTE — Progress Notes (Signed)
Occupational Therapy Evaluation Patient Details Name: Kathy Herrera MRN: [id number] DOB: 1960-12-08 Today's Date: 02/03/2014    History of Present Illness 53 yo female s/p L4-5 L5-s1 transforaminal lumbar interbody fusion rods and screw fixation, local bone graft PMH: DJD HTN GERD ARM surg    Clinical Impression   PTA pt lived at home with her husband and was independent with assistive devices. Pt requires assist for LB ADLs due to pain and decreased ROM. She also requires min (A) for functional mobility. Pt will benefit from acute OT to address LB ADLs and functional mobility.     Follow Up Recommendations  No OT follow up;Supervision/Assistance - 24 hour    Equipment Recommendations  3 in 1 bedside comode;Other (comment) (AE)    Recommendations for Other Services       Precautions / Restrictions Precautions Precautions: Fall;Back Precaution Booklet Issued: Yes (comment) Precaution Comments: Educated pt on 3/3 back precautions and incorporating into ADLs.  Required Braces or Orthoses: Spinal Brace Spinal Brace: Lumbar corset;Applied in sitting position Restrictions Weight Bearing Restrictions: No      Mobility Bed Mobility Overal bed mobility: Needs Assistance Bed Mobility: Rolling;Sidelying to Sit Rolling: Min guard Sidelying to sit: Min assist       General bed mobility comments: VC's for technique. Use of bed rail. (A) to elevate trunk off bed. Increased pain with EOB sitting.   Transfers Overall transfer level: Needs assistance Equipment used: Rolling walker (2 wheeled) Transfers: Sit to/from Stand Sit to Stand: Min assist Stand pivot transfers: Min assist       General transfer comment: min (A) to stabilize RW and guard during power up for balance. VC for deep breathing and relaxation due to pain. Assist to don brace at EOB and to tighten in standing.     Balance Overall balance assessment: Needs assistance Sitting-balance support: Bilateral upper  extremity supported;Feet supported Sitting balance-Leahy Scale: Fair Sitting balance - Comments: due to pain   Standing balance support: Bilateral upper extremity supported;During functional activity Standing balance-Leahy Scale: Poor Standing balance comment: Requires UE support on RW                            ADL Overall ADL's : Needs assistance/impaired Eating/Feeding: Independent;Sitting   Grooming: Set up;Sitting   Upper Body Bathing: Set up;Sitting   Lower Body Bathing: Maximal assistance;Sit to/from stand   Upper Body Dressing : Maximal assistance;Sitting Upper Body Dressing Details (indicate cue type and reason): including back brace Lower Body Dressing: Total assistance;Sit to/from stand   Toilet Transfer: Minimal assistance;Ambulation;RW (bed>recliner)     Toileting - Clothing Manipulation Details (indicate cue type and reason): educated pt on use of baby wipes and tongs to increase independence with ADLs.    Tub/Shower Transfer Details (indicate cue type and reason): pt plans to sponge bathe until she can get into her tub Functional mobility during ADLs: Min guard;Rolling walker General ADL Comments: Pt had increased pain sitting EOB and requires assist to don brace and for LB ADLs.      Vision  Pt reports no change from baseline.                    Perception Perception Perception Tested?: No   Praxis Praxis Praxis tested?: Within functional limits    Pertinent Vitals/Pain Pain Assessment: 0-10 Pain Score: 5  Pain Location: back, surgical Pain Descriptors / Indicators: Aching;Operative site guarding Pain Intervention(s): Limited activity within patient's  tolerance;Monitored during session;Repositioned     Hand Dominance Right   Extremity/Trunk Assessment Upper Extremity Assessment Upper Extremity Assessment: Generalized weakness   Lower Extremity Assessment Lower Extremity Assessment: Defer to PT evaluation RLE Deficits /  Details: AAROM WFL, strength hip flexion limited due to pain; knee extension 4-/5, ankle DF 4+/5 LLE Deficits / Details: AAROM WFL, strength hip flexion limited due to pain; knee extension 4-/5, ankle DF 4+/5; reports numbness/tingling left lateral leg, dorsum and plantar surface of foot   Cervical / Trunk Assessment Cervical / Trunk Assessment: Normal   Communication Communication Communication: No difficulties   Cognition Arousal/Alertness: Awake/alert Behavior During Therapy: WFL for tasks assessed/performed Overall Cognitive Status: Within Functional Limits for tasks assessed                                Home Living Family/patient expects to be discharged to:: Private residence Living Arrangements: Spouse/significant other Available Help at Discharge: Family;Available 24 hours/day Type of Home: House Home Access: Stairs to enter [employer] of Steps: 4 Entrance Stairs-Rails: Right Home Layout: One level     Bathroom Shower/Tub: Tub/shower unit Shower/tub characteristics: Curtain Biochemist, clinical: Handicapped height     Home Equipment: [place] - single point;Shower seat;Other (comment)          Prior Functioning/Environment Level of Independence: Independent with assistive device(s)             OT Diagnosis: Generalized weakness;Acute pain   OT Problem List: Decreased strength;Decreased range of motion;Decreased activity tolerance;Impaired balance (sitting and/or standing);Decreased knowledge of use of DME or AE;Decreased knowledge of precautions;Pain   OT Treatment/Interventions: Self-care/ADL training;Therapeutic exercise;Energy conservation;DME and/or AE instruction;Therapeutic activities;Patient/family education;Balance training    OT Goals(Current goals can be found in the care plan section) Acute Rehab OT Goals Patient Stated Goal: To be independent OT Goal Formulation: With patient Time For Goal Achievement: 02/17/14 Potential to  Achieve Goals: Good  OT Frequency: Min 2X/week    End of Session Equipment Utilized During Treatment: Gait belt;Rolling walker;Back brace  Activity Tolerance: Patient tolerated treatment well Patient left: in chair;with call bell/phone within reach   Time: 3295-1884 OT Time Calculation (min): 19 min Charges:  OT General Charges $OT Visit: 1 Procedure OT Evaluation $Initial OT Evaluation Tier I: 1 Procedure OT Treatments $Self Care/Home Management : 8-22 mins  Villa Herb M 02/03/2014, 4:45 PM   Cyndie Chime, OTR/L Occupational Therapist [phone] (pager)

## 2014-02-04 MED ORDER — OXYCODONE-ACETAMINOPHEN 5-325 MG PO TABS: 1.0000 | ORAL_TABLET | ORAL | Status: DC | PRN

## 2014-02-04 MED ORDER — METHOCARBAMOL 500 MG PO TABS: 500.0000 mg | ORAL_TABLET | Freq: Four times a day (QID) | ORAL | Status: DC | PRN

## 2014-02-04 MED ORDER — DSS 100 MG PO CAPS: 100.0000 mg | ORAL_CAPSULE | Freq: Two times a day (BID) | ORAL | Status: DC

## 2014-02-04 MED ORDER — GABAPENTIN 100 MG PO CAPS: 100.0000 mg | ORAL_CAPSULE | Freq: Every day | ORAL | Status: DC

## 2014-02-04 MED ORDER — SENNA 8.6 MG PO TABS: 1.0000 | ORAL_TABLET | Freq: Two times a day (BID) | ORAL | Status: DC

## 2014-02-04 NOTE — Progress Notes (Signed)
Patient ID: Kathy Herrera [facility], female   DOB: 05-26-1960, 53 y.o.   MRN: [id number] Subjective: 2 Days Post-Op Procedure(s) (LRB): CENTRAL LAMINECTOMY L4-5,L5-S1, TRANSFORAMINAL LUMBAR INTERBODY FUSION L4-5,L5-S1, RODS AND SCREW FIX  AND L5-S1, LOCAL BONE GRAFT (N/A) Awake, alert and oriented x4. No head aches, no photophobia. No nausea or emesis. Up with PT and OT. Voiding well, positive flatus.   Patient reports pain as moderate.    Objective:   VITALS:  Temp:  [98.1 F (36.7 C)-98.7 F (37.1 C)] 98.7 F (37.1 C) (12/03 0610) Pulse Rate:  [88-95] 95 (12/03 0610) Resp:  [18] 18 (12/03 0610) BP: (100-116)/(48-66) 100/51 mmHg (12/03 0610) SpO2:  [94 %-96 %] 94 % (12/03 0610)  Neurologically intact ABD soft Neurovascular intact Sensation intact distally Intact pulses distally Dorsiflexion/Plantar flexion intact Incision: no drainage No cellulitis present Dressing dry and incision is dry, no fluctuance.   LABS  Recent Labs  02/03/14 0630  HGB 12.7  WBC 14.6*  PLT 177    Recent Labs  02/03/14 0630  NA 140  K 4.1  CL 103  CO2 23  BUN 18  CREATININE 1.08  GLUCOSE 98   No results for input(s): LABPT, INR in the last 72 hours.   Assessment/Plan: 2 Days Post-Op Procedure(s) (LRB): CENTRAL LAMINECTOMY L4-5,L5-S1, TRANSFORAMINAL LUMBAR INTERBODY FUSION L4-5,L5-S1, RODS AND SCREW FIX  AND L5-S1, LOCAL BONE GRAFT (N/A) Hypertension, currently running low SBP, will adjust meds, stopping HCTZ.   Advance diet Up with therapy D/C IV fluids Plan for discharge tomorrow Discharge home with home health  Sabreen Kitchen E 02/04/2014, 8:11 AM

## 2014-02-04 NOTE — Clinical Social Work Psychosocial (Signed)
Clinical Social Work Department BRIEF PSYCHOSOCIAL ASSESSMENT 02/04/2014  Patient:  Kathy Herrera, Kathy Herrera     Account Number:  [id number]     Admit date:  02/02/2014  Clinical Social Worker:  Delrae Sawyers  Date/Time:  02/04/2014 03:56 PM  Referred by:  Physician  Date Referred:  02/04/2014 Referred for  SNF Placement   Other Referral:   none.   Interview type:  Patient Other interview type:   none.    PSYCHOSOCIAL DATA Living Status:  HUSBAND Admitted from facility:   Level of care:   Primary support name:  Kathy Herrera Primary support relationship to patient:  SPOUSE Degree of support available:   Strong support system.    CURRENT CONCERNS Current Concerns  Post-Acute Placement   Other Concerns:   none.    SOCIAL WORK ASSESSMENT / PLAN CSW received consult regarding change in discharge diposition. CSW met with pt at bedside to discuss discharge disposition. Pt informed CSW pt aware and agreeable to PT new recommendation of short-term rehabiliation at SNF once medically stable for discharge. Pt expressed no preference for SNF facility. CSW to continue to follow and assist with discharge planning.   Assessment/plan status:  Psychosocial Support/Ongoing Assessment of Needs Other assessment/ plan:   none.   Information/referral to community resources:   [facility]  FL2 completed and placed on pt's chart, MD please sign.  PASARR.    PATIENT'S/FAMILY'S RESPONSE TO PLAN OF CARE: Pt understanding and agreeable to CSW plan of care. Pt expressed no further questions or concerns at this time.       [name], [place] ([phone]) Licensed Clinical Social Worker Orthopedics [phone]) and Surgical [phone])

## 2014-02-04 NOTE — Progress Notes (Signed)
Occupational Therapy Treatment Patient Details Name: Kathy Herrera [facility] MRN: [id number] DOB: 1960-12-06 Today's Date: 02/04/2014    History of present illness 53 yo female s/p L4-5 L5-s1 transforaminal lumbar interbody fusion rods and screw fixation, local bone graft PMH: DJD HTN GERD ARM surg    OT comments  Pt seen today for ADL session. Pt was agreeable to get OOB for toileting and to sit up in recliner for dinner, however once sitting pt was unable to tolerate recliner chair and requested to return to bed. Pt presents with increased pain in back today which is limiting her functional mobility. Pt is now planning to d/c to SNF and OT agrees with this d/c plan. Pt will continue to benefit from acute OT to address ADLs and functional mobility.    Follow Up Recommendations  SNF;Supervision/Assistance - 24 hour    Equipment Recommendations  3 in 1 bedside comode;Other (comment)    Recommendations for Other Services      Precautions / Restrictions Precautions Precautions: Fall;Back Precaution Booklet Issued: Yes (comment) Precaution Comments: Reinforced 3/3 back precautions Required Braces or Orthoses: Spinal Brace Spinal Brace: Lumbar corset;Applied in sitting position Restrictions Weight Bearing Restrictions: No       Mobility Bed Mobility Overal bed mobility: Needs Assistance Bed Mobility: Rolling;Sidelying to Sit;Sit to Sidelying Rolling: Min guard Sidelying to sit: Min guard     Sit to sidelying: Min guard General bed mobility comments: No physical assist needed. VC's for technique and sequencing.   Transfers Overall transfer level: Needs assistance Equipment used: Rolling walker (2 wheeled) Transfers: Sit to/from Stand Sit to Stand: Min assist Stand pivot transfers: Min assist       General transfer comment: Min (A) to power up due to pain in back.         ADL Overall ADL's : Needs assistance/impaired     Grooming: Set up;Sitting                    Toilet Transfer: Ambulation;RW;Min guard   Toileting- Clothing Manipulation and Hygiene: Sit to/from stand;Minimal assistance Toileting - Clothing Manipulation Details (indicate cue type and reason): pt able to perform toilet hygiene from the front without breaking back precautions     Functional mobility during ADLs: Min guard;Rolling walker General ADL Comments: Pt continues to be limited due to pain, especially when sitting in chair. Pt was unable to tolerate chair sitting for dinner and had to return to bed with upright positioning                Cognition  Arousal/Alertness: Awake/Alert Behavior During Therapy: WFL for tasks assessed/performed Overall Cognitive Status: Within Functional Limits for tasks assessed                                    Pertinent Vitals/ Pain       Pain Assessment: 0-10 Pain Score: 7  Faces Pain Scale: Hurts little more Pain Location: back pain Pain Descriptors / Indicators: Aching;Operative site guarding Pain Intervention(s): Limited activity within patient's tolerance;Monitored during session;Repositioned;Utilized relaxation techniques;Patient requesting pain meds-RN notified         Frequency Min 2X/week     Progress Toward Goals  OT Goals(current goals can now be found in the care plan section)  Progress towards OT goals: Progressing toward goals  Acute Rehab OT Goals Patient Stated Goal: to get home when she is ready  Plan Discharge plan  needs to be updated       End of Session Equipment Utilized During Treatment: Gait belt;Rolling walker;Back brace   Activity Tolerance Patient limited by pain   Patient Left in bed;with call bell/phone within reach   Nurse Communication          Time: 4715-9539 OT Time Calculation (min): 27 min  Charges: OT General Charges $OT Visit: 1 Procedure OT Treatments $Self Care/Home Management : 23-37 mins  Juluis Rainier 02/04/2014, 5:07 PM   Cyndie Chime,  OTR/L Occupational Therapist [phone] (pager)

## 2014-02-04 NOTE — Progress Notes (Signed)
Physical Therapy Treatment Patient Details Name: Kathy Herrera MRN: [id number] DOB: [birth year] Today's Date: 02/04/2014    History of Present Illness 53 yo female s/p L4-5 L5-s1 transforaminal lumbar interbody fusion rods and screw fixation, local bone graft PMH: DJD HTN GERD ARM surg     PT Comments    Pt was seen for another follow up tosurgery, with application of brace with assistance and short trip of gait with some concern for her safety at home.  Pt has an environment with limited physical assistance and cannot get OOB without significant help.  PT now considering SNF as she is progressing slowly to go directly home.  Case manager was given the communication.  Follow Up Recommendations  SNF     Equipment Recommendations  Rolling walker with 5" wheels    Recommendations for Other Services       Precautions / Restrictions Precautions Precautions: Fall;Back Precaution Booklet Issued: Yes (comment) Precaution Comments: Educated pt on 3/3 back precautions and incorporating into ADLs.  Required Braces or Orthoses: Spinal Brace Spinal Brace: Lumbar corset;Applied in sitting position Restrictions Weight Bearing Restrictions: No    Mobility  Bed Mobility Overal bed mobility: Needs Assistance Bed Mobility: Rolling;Sidelying to Sit Rolling: Min guard;Min assist Sidelying to sit: Mod assist       General bed mobility comments: cued technique and monitored posture during the transition  Transfers Overall transfer level: Needs assistance Equipment used: Rolling walker (2 wheeled) Transfers: Sit to/from [facility] Sit to Stand: Mod assist Stand pivot transfers: Min assist       General transfer comment: prompts for steadying standing initially and then to direct walker  Ambulation/Gait Ambulation/Gait assistance: Min assist;Mod assist Ambulation Distance (Feet): 21 Feet Assistive device: Rolling walker (2 wheeled) Gait Pattern/deviations: Step-to  pattern;Decreased dorsiflexion - right;Decreased dorsiflexion - left;Wide base of support;Drifts right/left;Decreased step length - left;Decreased step length - right Gait velocity: reduced Gait velocity interpretation: Below normal speed for age/gender General Gait Details: somewhat difficult for pt to control her walker and reminders for direction and obstacle avoidance   Stairs            Wheelchair Mobility    Modified Rankin (Stroke Patients Only)       Balance Overall balance assessment: Needs assistance Sitting-balance support: Feet supported;Bilateral upper extremity supported Sitting balance-Leahy Scale: Poor Sitting balance - Comments: due to pain   Standing balance support: Bilateral upper extremity supported Standing balance-Leahy Scale: Poor Standing balance comment: walker is steadying her with PT to control                    Cognition Arousal/Alertness: Awake/alert Behavior During Therapy: WFL for tasks assessed/performed Overall Cognitive Status: Within Functional Limits for tasks assessed                      Exercises General Exercises - Lower Extremity Ankle Circles/Pumps: AROM;Both;5 reps Quad Sets: AROM;Both;10 reps Gluteal Sets: AROM;Both;10 reps Hip ABduction/ADduction: AROM;Both;10 reps    General Comments General comments (skin integrity, edema, etc.): Pt is diaphoretic with movement and has been medicated per nursing.  Nurse was looking to see what else pt could have to control the pain.      Pertinent Vitals/Pain Pain Assessment: Faces Faces Pain Scale: Hurts little more Pain Location: back surgery Pain Intervention(s): Limited activity within patient's tolerance;Premedicated before session;Monitored during session;Repositioned;Relaxation;Other (comment) (Instructions for limiting sitting time)    Home Living  Prior Function            PT Goals (current goals can now be found in the  care plan section) Acute Rehab PT Goals Patient Stated Goal: to get home when she is ready Progress towards PT goals: Progressing toward goals    Frequency  Min 5X/week    PT Plan Discharge plan needs to be updated    Co-evaluation             End of Session Equipment Utilized During Treatment: Gait belt;Back brace Activity Tolerance: Patient limited by pain Patient left: in chair;with call bell/phone within reach     Time: 1315-1343 PT Time Calculation (min) (ACUTE ONLY): 28 min  Charges:  $Gait Training: 8-22 mins $Therapeutic Exercise: 8-22 mins                    G Codes:      Ramond Dial Feb 08, 2014, 2:39 PM  Mee Hives, PT MS Acute Rehab Dept. Number: [phone]

## 2014-02-04 NOTE — Clinical Social Work Placement (Addendum)
Clinical Social Work Department CLINICAL SOCIAL WORK PLACEMENT NOTE 02/04/2014  Patient:  Kathy Herrera, Kathy Herrera  Account Number:  [id number] Admit date:  02/02/2014  Clinical Social Worker:  Delrae Sawyers  Date/time:  02/04/2014 03:59 PM  Clinical Social Work is seeking post-discharge placement for this patient at the following level of care:   [place]   (*CSW will update this form in Epic as items are completed)   02/04/2014  Patient/family provided with [place] Department of Clinical Social Work's list of facilities offering this level of care within the geographic area requested by the patient (or if unable, by the patient's family).  02/04/2014  Patient/family informed of their freedom to choose among providers that offer the needed level of care, that participate in Medicare, Medicaid or managed care program needed by the patient, have an available bed and are willing to accept the patient.  02/04/2014  Patient/family informed of [facility]' ownership interest in [facility], as well as of the fact that they are under no obligation to receive care at this facility.  PASARR submitted to EDS on 02/04/2014 PASARR number received on 02/04/2014  FL2 transmitted to all facilities in geographic area requested by pt/family on  02/04/2014 FL2 transmitted to all facilities within larger geographic area on   Patient informed that his/her managed care company has contracts with or will negotiate with  certain facilities, including the following:     Patient/family informed of bed offers received:  02/05/2014 Patient chooses bed at [facility] Physician recommends and patient chooses bed at    Patient to be transferred to  [facility] on  02/05/2014 Patient to be transferred to facility by [facility] Patient and family notified of transfer on 02/05/2014 Name of family member notified:  Pt updated at bedside regarding discharge. Pt alert and oriented.  The  following physician request were entered in Epic:   Additional Comments:  Henderson [place] ([phone]) Licensed Clinical Social Worker Orthopedics [phone]) and Surgical [phone])

## 2014-02-04 NOTE — Care Management Note (Signed)
CARE MANAGEMENT NOTE 02/04/2014  Patient:  CHESNEY, SUARES   Account Number:  [id number]  Date Initiated:  02/04/2014  Documentation initiated by:  Ricki Miller  Subjective/Objective Assessment:   53 yr old female admitted with L4-5, L5-S1 spinal stenosis. Patient had  a Central Laminectomy, L5-S1 Transforaminal Lumbar Interbody fusion L4-5, L5-S1.     Action/Plan:   Case Manager spoke with patient concerning Home health and DME needs. Choice offered. Referral called to [facility] liaison. Patient has family support at discharge.   Anticipated DC Date:  02/05/2014   Anticipated DC Plan:  [place]  CM consult      [facility] Choice  HOME HEALTH  DURABLE MEDICAL EQUIPMENT   Choice offered to / List presented to:  C-1 Patient   DME arranged  [place]  3-N-1      DME agency  [employer]     HH arranged  HH-2 PT      [place].   Status of service:  In process, will continue to follow Medicare Important Message given?   (If response is "NO", the following Medicare IM given date fields will be blank) Date Medicare IM given:   Medicare IM given by:   Date Additional Medicare IM given:   Additional Medicare IM given by:    Discharge Disposition:    Per UR Regulation:  Reviewed for med. necessity/level of care/duration of stay

## 2014-02-05 ENCOUNTER — Encounter (HOSPITAL_COMMUNITY): Payer: Self-pay | Admitting: Specialist

## 2014-02-05 DIAGNOSIS — M4316 Spondylolisthesis, lumbar region: Secondary | ICD-10-CM | POA: Diagnosis not present

## 2014-02-05 LAB — CBC
HCT: 34.3 % — ABNORMAL LOW (ref 36.0–46.0)
Hemoglobin: 11.5 g/dL — ABNORMAL LOW (ref 12.0–15.0)
MCH: 30 pg (ref 26.0–34.0)
MCHC: 33.5 g/dL (ref 30.0–36.0)
MCV: 89.6 fL (ref 78.0–100.0)
Platelets: 138 10*3/uL — ABNORMAL LOW (ref 150–400)
RBC: 3.83 MIL/uL — ABNORMAL LOW (ref 3.87–5.11)
RDW: 14.7 % (ref 11.5–15.5)
WBC: 14.5 10*3/uL — ABNORMAL HIGH (ref 4.0–10.5)

## 2014-02-05 LAB — TYPE AND SCREEN
ABO/RH(D): AB POS
Antibody Screen: NEGATIVE
Unit division: 0
Unit division: 0

## 2014-02-05 MED ORDER — DSS 100 MG PO CAPS: 100.0000 mg | ORAL_CAPSULE | Freq: Two times a day (BID) | ORAL | Status: DC

## 2014-02-05 MED ORDER — METHOCARBAMOL 500 MG PO TABS: 500.0000 mg | ORAL_TABLET | Freq: Four times a day (QID) | ORAL | Status: DC | PRN

## 2014-02-05 MED ORDER — SENNA 8.6 MG PO TABS: 1.0000 | ORAL_TABLET | Freq: Two times a day (BID) | ORAL | Status: DC

## 2014-02-05 MED ORDER — OXYCODONE-ACETAMINOPHEN 5-325 MG PO TABS: 1.0000 | ORAL_TABLET | ORAL | Status: DC | PRN

## 2014-02-05 NOTE — Clinical Social Work Note (Addendum)
Pt to be discharged to [facility]. Pt updated at bedside regarding discharge disposition.  [facility] Medicare SNF authorization: [id number]  Facility: [place] Report number: [phone] Transportation: EMS ([address])  [name], [place] ([phone]) Licensed Clinical Social Worker Orthopedics [phone]) and Surgical [phone])

## 2014-02-05 NOTE — Discharge Summary (Signed)
Physician Discharge Summary  Patient ID: Kathy Herrera MRN: [id number] DOB/AGE: 1960-11-03 53 y.o.  Admit date: 02/02/2014 Discharge date: 02/05/2014  Admission Diagnoses:  Spondylolisthesis of lumbar region L4-5 and L5-S1  Discharge Diagnoses:  Principal Problem:   Spondylolisthesis of lumbar region Active Problems:   Spinal stenosis, lumbar region, with neurogenic claudication   Lumbar degenerative disc disease   Past Medical History  Diagnosis Date  . DJD (degenerative joint disease) of lumbar spine   . Heart murmur   . Hypertension   . Hyperlipidemia   . GERD (gastroesophageal reflux disease)     not everyday and uses tums when needed    Surgeries: Procedure(s): CENTRAL LAMINECTOMY L4-5,L5-S1, TRANSFORAMINAL LUMBAR INTERBODY FUSION L4-5,L5-S1, RODS AND SCREW FIX  AND L5-S1, LOCAL BONE GRAFT on 02/02/2014   Consultants (if any):  none  Discharged Condition: Improved  Hospital Course: Kathy Herrera is an 53 y.o. female who was admitted 02/02/2014 with a diagnosis of Spondylolisthesis of lumbar region and went to the operating room on 02/02/2014 and underwent the above named procedures.    She was given perioperative antibiotics:  Anti-infectives    Start     Dose/Rate Route Frequency Ordered Stop   02/02/14 2330  ceFAZolin (ANCEF) IVPB 1 g/50 mL premix     1 g100 mL/hr over 30 Minutes Intravenous Every 8 hours 02/02/14 1948 02/03/14 0935   02/02/14 0600  ceFAZolin (ANCEF) 3 g in dextrose 5 % 50 mL IVPB     3 g160 mL/hr over 30 Minutes Intravenous On call to O.R. 02/01/14 1453 02/02/14 1534    Pt progressed slowly with PT and OT.  If was recommended that she have inpt rehab.  Family chose [facility] and pt was transferred in stable condition.   Pt was having flatus but no BM, voiding and eating well.   Wound healing well.  NV and motor of LEs intact. Pain was well controlled with po analgesics.  She was given sequential compression devices, early ambulation for DVT  prophylaxis.  She benefited maximally from the hospital stay and there were no complications.    Recent vital signs:  Filed Vitals:   02/05/14 0526  BP: 97/52  Pulse: 101  Temp: 98.4 F (36.9 C)  Resp: 20    Recent laboratory studies:  Lab Results  Component Value Date   HGB 12.7 02/03/2014   HGB 14.6 01/21/2014   HGB 13.9 04/06/2013   Lab Results  Component Value Date   WBC 14.6* 02/03/2014   PLT 177 02/03/2014   No results found for: INR Lab Results  Component Value Date   NA 140 02/03/2014   K 4.1 02/03/2014   CL 103 02/03/2014   CO2 23 02/03/2014   BUN 18 02/03/2014   CREATININE 1.08 02/03/2014   GLUCOSE 98 02/03/2014    Discharge Medications:     Medication List    STOP taking these medications        hydrochlorothiazide 25 MG tablet  Commonly known as:  HYDRODIURIL      TAKE these medications        albuterol 108 (90 BASE) MCG/ACT inhaler  Commonly known as:  PROVENTIL HFA;VENTOLIN HFA  Inhale 2 puffs into the lungs every 6 (six) hours as needed for wheezing.     aspirin EC 81 MG tablet  Take 81 mg by mouth daily.     atenolol 100 MG tablet  Commonly known as:  TENORMIN  Take 1 tablet (100 mg total) by mouth  daily.     azithromycin 250 MG tablet  Commonly known as:  ZITHROMAX  Take 2 tabs today, then 1 tab daily x 4 days     cloNIDine 0.1 MG tablet  Commonly known as:  CATAPRES  Take 1 tablet (0.1 mg total) by mouth daily.     DSS 100 MG Caps  Take 100 mg by mouth 2 (two) times daily.     gabapentin 100 MG capsule  Commonly known as:  NEURONTIN  Take 1 capsule (100 mg total) by mouth daily.     gabapentin 100 MG capsule  Commonly known as:  NEURONTIN  Take 1 capsule (100 mg total) by mouth daily.     HYDROcodone-acetaminophen 5-325 MG per tablet  Commonly known as:  NORCO/VICODIN  Take 1 tablet by mouth every 6 (six) hours as needed.     HYDROcodone-homatropine 5-1.5 MG/5ML syrup  Commonly known as:  HYCODAN  Take 5 mLs by  mouth every 8 (eight) hours as needed for cough.     lisinopril 40 MG tablet  Commonly known as:  PRINIVIL,ZESTRIL  Take 1 tablet (40 mg total) by mouth daily.     meloxicam 15 MG tablet  Commonly known as:  MOBIC  Take 1 tablet (15 mg total) by mouth daily as needed.     methocarbamol 500 MG tablet  Commonly known as:  ROBAXIN  Take 1 tablet (500 mg total) by mouth every 6 (six) hours as needed for muscle spasms.     oxyCODONE-acetaminophen 5-325 MG per tablet  Commonly known as:  PERCOCET/ROXICET  Take 1-2 tablets by mouth every 4 (four) hours as needed for moderate pain.     potassium chloride 10 MEQ tablet  Commonly known as:  K-DUR  Take 1 tablet (10 mEq total) by mouth daily.     senna 8.6 MG Tabs tablet  Commonly known as:  SENOKOT  Take 1 tablet (8.6 mg total) by mouth 2 (two) times daily.        Diagnostic Studies: Dg Lumbar Spine Complete  02/02/2014   CLINICAL DATA:  Central laminectomy at L4-L5 and L5-S1, trans foraminal lumbar interbody fusion L4-L5 and L5-S1 with bone grafting, degenerative disc disease changes lumbar spine  EXAM: LUMBAR SPINE - COMPLETE 4+ VIEW; DG C-ARM GT 120 MIN  COMPARISON:  MRI lumbar spine 12/04/2013, lumbar spine radiographs 12/04/2013  FINDINGS: Six digital C-arm fluoroscopic images obtained intraoperatively are compared to prior studies.  Prior MRI labeled with 5 lumbar vertebrae, current images labeled accordingly.  BILATERAL pedicle screws and posterior bars are present at L4-S1.  Disc prostheses present at L4-L5 and L5-S1 disc spaces, which appear narrowed.  No gross evidence of fracture or subluxation seen, though L5 and S1 are suboptimally visualized.  Hardware appears intact.  IMPRESSION: Posterior fusion of L4-S1.   Electronically Signed   By: Lavonia Dana M.D.   On: 02/02/2014 17:31   Dg C-arm Gt 120 Min  02/02/2014   CLINICAL DATA:  Central laminectomy at L4-L5 and L5-S1, trans foraminal lumbar interbody fusion L4-L5 and L5-S1 with  bone grafting, degenerative disc disease changes lumbar spine  EXAM: LUMBAR SPINE - COMPLETE 4+ VIEW; DG C-ARM GT 120 MIN  COMPARISON:  MRI lumbar spine 12/04/2013, lumbar spine radiographs 12/04/2013  FINDINGS: Six digital C-arm fluoroscopic images obtained intraoperatively are compared to prior studies.  Prior MRI labeled with 5 lumbar vertebrae, current images labeled accordingly.  BILATERAL pedicle screws and posterior bars are present at L4-S1.  Disc prostheses present  at L4-L5 and L5-S1 disc spaces, which appear narrowed.  No gross evidence of fracture or subluxation seen, though L5 and S1 are suboptimally visualized.  Hardware appears intact.  IMPRESSION: Posterior fusion of L4-S1.   Electronically Signed   By: Lavonia Dana M.D.   On: 02/02/2014 17:31    Disposition: [facility] (SNF)      Discharge Instructions    Call MD / Call 911    Complete by:  As directed   If you experience chest pain or shortness of breath, CALL 911 and be transported to the hospital emergency room.  If you develope a fever above 101 F, pus (white drainage) or increased drainage or redness at the wound, or calf pain, call your surgeon's office.     Constipation Prevention    Complete by:  As directed   Drink plenty of fluids.  Prune juice may be helpful.  You may use a stool softener, such as Colace (over the counter) 100 mg twice a day.  Use MiraLax (over the counter) for constipation as needed.     Diet - low sodium heart healthy    Complete by:  As directed      Discharge instructions    Complete by:  As directed   Call if there is increasing drainage, fever greater than 101.5, severe head aches, and worsening nausea or light sensitivity. If shortness of breath, bloody cough or chest tightness or pain go to an emergency room. No lifting greater than 10 lbs. Avoid bending, stooping and twisting. Use brace when sitting and out of bed even to go to bathroom. Walk in house for first 2 weeks then may start to get  out slowly increasing distances up to one mile by 4-6 weeks post op. After 4 days may shower Saturday and change dressing following bathing with shower.When bathing remove the brace shower and replace brace before getting out of the shower. If drainage, keep dry dressing and do not bathe the incision, use an moisture impervious dressing. Please call and return for scheduled follow up appointment 2 weeks from the time of surgery.     Driving restrictions    Complete by:  As directed   No driving for 4 weeks     Increase activity slowly as tolerated    Complete by:  As directed      Lifting restrictions    Complete by:  As directed   No lifting for 8 weeks           Follow-up Information    Follow up with Orren Pietsch E, MD In 2 weeks.   Specialty:  Orthopedic Surgery   Contact information:   [place] [address] [phone]       Follow up with [facility].   Why:  Someone from [facility] will contact you concerning start date and time for physical therapy.    Contact information:   [address] [place] [address] [phone]      Patient d/c summary note and lab reviewed.  Signed: Epimenio Foot 02/05/2014, 8:34 AM

## 2014-02-05 NOTE — Progress Notes (Signed)
Second IM given 9:10am 02/05/14.

## 2014-02-05 NOTE — Progress Notes (Signed)
Subjective: 3 Days Post-Op Procedure(s) (LRB): CENTRAL LAMINECTOMY L4-5,L5-S1, TRANSFORAMINAL LUMBAR INTERBODY FUSION L4-5,L5-S1, RODS AND SCREW FIX  AND L5-S1, LOCAL BONE GRAFT (N/A) Patient reports pain as mild.  Well controlled with po meds. Having plenty of flatus.  No bm yet.  Voiding well.  Eating well. No nausea. Daughter helping with dc planning and has talked to [facility] and wishes to go there for rehab.  She has been told there is a bed and her insurance is acceptable.  Will ask case manager to help with arrangements today.  Objective: Vital signs in last 24 hours: Temp:  [98.4 F (36.9 C)-98.7 F (37.1 C)] 98.4 F (36.9 C) (12/04 0526) Pulse Rate:  [88-101] 101 (12/04 0526) Resp:  [17-20] 20 (12/04 0526) BP: (95-114)/(52-74) 97/52 mmHg (12/04 0526) SpO2:  [96 %-98 %] 96 % (12/04 0526)  Intake/Output from previous day: 12/03 0701 - 12/04 0700 In: 923 [P.O.:920; I.V.:3] Out: -  Intake/Output this shift:     Recent Labs  02/03/14 0630  HGB 12.7    Recent Labs  02/03/14 0630  WBC 14.6*  RBC 4.29  HCT 38.5  PLT 177    Recent Labs  02/03/14 0630  NA 140  K 4.1  CL 103  CO2 23  BUN 18  CREATININE 1.08  GLUCOSE 98  CALCIUM 8.5   No results for input(s): LABPT, INR in the last 72 hours.  ABD soft Neurovascular intact Sensation intact distally Dorsiflexion/Plantar flexion intact Incision: no drainage  Assessment/Plan: 3 Days Post-Op Procedure(s) (LRB): CENTRAL LAMINECTOMY L4-5,L5-S1, TRANSFORAMINAL LUMBAR INTERBODY FUSION L4-5,L5-S1, RODS AND SCREW FIX  AND L5-S1, LOCAL BONE GRAFT (N/A) Up with therapy Discharge to SNF:  [facility] today if available.  Gala Padovano M 02/05/2014, 8:18 AM

## 2014-02-05 NOTE — Progress Notes (Signed)
[facility] to be D/C'd Skilled nursing facility per MD order. Discussed with the patient and all questions fully answered.    Medication List    STOP taking these medications        hydrochlorothiazide 25 MG tablet  Commonly known as:  HYDRODIURIL      TAKE these medications        albuterol 108 (90 BASE) MCG/ACT inhaler  Commonly known as:  PROVENTIL HFA;VENTOLIN HFA  Inhale 2 puffs into the lungs every 6 (six) hours as needed for wheezing.     aspirin EC 81 MG tablet  Take 81 mg by mouth daily.     atenolol 100 MG tablet  Commonly known as:  TENORMIN  Take 1 tablet (100 mg total) by mouth daily.     azithromycin 250 MG tablet  Commonly known as:  ZITHROMAX  Take 2 tabs today, then 1 tab daily x 4 days     cloNIDine 0.1 MG tablet  Commonly known as:  CATAPRES  Take 1 tablet (0.1 mg total) by mouth daily.     DSS 100 MG Caps  Take 100 mg by mouth 2 (two) times daily.     gabapentin 100 MG capsule  Commonly known as:  NEURONTIN  Take 1 capsule (100 mg total) by mouth daily.     gabapentin 100 MG capsule  Commonly known as:  NEURONTIN  Take 1 capsule (100 mg total) by mouth daily.     HYDROcodone-acetaminophen 5-325 MG per tablet  Commonly known as:  NORCO/VICODIN  Take 1 tablet by mouth every 6 (six) hours as needed.     HYDROcodone-homatropine 5-1.5 MG/5ML syrup  Commonly known as:  HYCODAN  Take 5 mLs by mouth every 8 (eight) hours as needed for cough.     lisinopril 40 MG tablet  Commonly known as:  PRINIVIL,ZESTRIL  Take 1 tablet (40 mg total) by mouth daily.     meloxicam 15 MG tablet  Commonly known as:  MOBIC  Take 1 tablet (15 mg total) by mouth daily as needed.     methocarbamol 500 MG tablet  Commonly known as:  ROBAXIN  Take 1 tablet (500 mg total) by mouth every 6 (six) hours as needed for muscle spasms.     oxyCODONE-acetaminophen 5-325 MG per tablet  Commonly known as:  PERCOCET/ROXICET  Take 1-2 tablets by mouth every 4 (four) hours  as needed for moderate pain.     potassium chloride 10 MEQ tablet  Commonly known as:  K-DUR  Take 1 tablet (10 mEq total) by mouth daily.     senna 8.6 MG Tabs tablet  Commonly known as:  SENOKOT  Take 1 tablet (8.6 mg total) by mouth 2 (two) times daily.        VVS, Skin clean, dry and intact without evidence of skin break down, no evidence of skin tears noted.  IV catheter discontinued intact. Site without signs and symptoms of complications. Dressing and pressure applied.  An After Visit Summary was printed and given to the patient.  Patient escorted via stretcher, and D/C to SNF via ambulance.  Cyndra Numbers  02/05/2014 3:58 PM

## 2014-02-05 NOTE — Progress Notes (Signed)
Report called to [facility]  

## 2014-02-08 ENCOUNTER — Non-Acute Institutional Stay (SKILLED_NURSING_FACILITY): Payer: Medicare HMO | Admitting: Internal Medicine

## 2014-02-08 ENCOUNTER — Encounter: Payer: Self-pay | Admitting: Internal Medicine

## 2014-02-08 DIAGNOSIS — J069 Acute upper respiratory infection, unspecified: Secondary | ICD-10-CM

## 2014-02-08 DIAGNOSIS — M179 Osteoarthritis of knee, unspecified: Secondary | ICD-10-CM

## 2014-02-08 DIAGNOSIS — IMO0002 Reserved for concepts with insufficient information to code with codable children: Secondary | ICD-10-CM

## 2014-02-08 DIAGNOSIS — K5900 Constipation, unspecified: Secondary | ICD-10-CM

## 2014-02-08 DIAGNOSIS — Z720 Tobacco use: Secondary | ICD-10-CM

## 2014-02-08 DIAGNOSIS — I10 Essential (primary) hypertension: Secondary | ICD-10-CM

## 2014-02-08 DIAGNOSIS — M1710 Unilateral primary osteoarthritis, unspecified knee: Secondary | ICD-10-CM

## 2014-02-08 DIAGNOSIS — M4316 Spondylolisthesis, lumbar region: Secondary | ICD-10-CM

## 2014-02-08 NOTE — Progress Notes (Signed)
Patient ID: Kathy Herrera [facility], female   DOB: [birth year], 53 y.o.   MRN: [id number]     [facility]   PCP: Arnette Norris, MD  Code Status: full code  Allergies  Allergen Reactions  . Shrimp [Shellfish Allergy] Hives    May be seasoning not always    Chief Complaint  Patient presents with  . New Admit To SNF     HPI:  53 y/o female patient is here for STR post hospital admission from 02/02/2014- 02/05/2014 with Spondylolisthesis of lumbar region L4-5 and L5-S1. She underwent central laminectomy and transforaminal lumbar interbody fusion with rods and screw fix and bone graft on 02/02/14. She is here for rehabilitation. Her pain is not under control with current pain regimen. She smoked 1 ppd and finds herself being jittery as she is unable to smoke in the facility. She also complaints of ringing and popping sound in her left ear. Denies runny nose, sore throat or ear ache. She is on antibiotics for acute URI.  Review of Systems:  Constitutional: Negative for fever, chills, malaise/fatigue and diaphoresis.  HENT: Negative for congestion, hearing loss, earache and sore throat.   Eyes: Negative for eye pain, blurred vision, double vision and discharge.  Respiratory: positive for cough. Negative for sputum production, shortness of breath and wheezing.   Cardiovascular: Negative for chest pain, palpitations, orthopnea and leg swelling.  Gastrointestinal: Negative for heartburn, nausea, vomiting, abdominal pain, constipation. appetite is normal Genitourinary: Negative for dysuria  Skin: Negative for itching, rash.  Neurological: Negative for dizziness, tingling, focal weakness and headaches.  Psychiatric/Behavioral: Negative for depression  Past Medical History  Diagnosis Date  . DJD (degenerative joint disease) of lumbar spine   . Heart murmur   . Hypertension   . Hyperlipidemia   . GERD (gastroesophageal reflux disease)     not everyday and uses tums when  needed   Past Surgical History  Procedure Laterality Date  . Abdominal hysterectomy      partial- still has cervix.  . Cholecystectomy    . Tonsillectomy and adenoidectomy    . Arm surgery Left   . Lumbar fusion N/A 02/02/2014    Procedure: CENTRAL LAMINECTOMY L4-5,L5-S1, TRANSFORAMINAL LUMBAR INTERBODY FUSION L4-5,L5-S1, RODS AND SCREW FIX  AND L5-S1, LOCAL BONE GRAFT;  Surgeon: Jessy Oto, MD;  Location: [facility];  Service: Orthopedics;  Laterality: N/A;   Social History:   reports that she has been smoking Cigarettes.  She has a 20 pack-year smoking history. She does not have any smokeless tobacco history on file. She reports that she drinks alcohol. She reports that she does not use illicit drugs.  Family History  Problem Relation Age of Onset  . Cancer Mother 53    cervical  . CVA Mother   . Hypertension Mother     Medications: Patient's Medications  New Prescriptions   No medications on file  Previous Medications   ALBUTEROL (PROVENTIL HFA;VENTOLIN HFA) 108 (90 BASE) MCG/ACT INHALER    Inhale 2 puffs into the lungs every 6 (six) hours as needed for wheezing.   ASPIRIN EC 81 MG TABLET    Take 81 mg by mouth daily.   ATENOLOL (TENORMIN) 100 MG TABLET    Take 1 tablet (100 mg total) by mouth daily.   AZITHROMYCIN (ZITHROMAX) 250 MG TABLET    Take 2 tabs today, then 1 tab daily x 4 days   CLONIDINE (CATAPRES) 0.1 MG TABLET    Take 1 tablet (  0.1 mg total) by mouth daily.   DOCUSATE SODIUM (DSS) 100 MG CAPS    Take 100 mg by mouth 2 (two) times daily.   GABAPENTIN (NEURONTIN) 100 MG CAPSULE    Take 1 capsule (100 mg total) by mouth daily.   GABAPENTIN (NEURONTIN) 100 MG CAPSULE    Take 1 capsule (100 mg total) by mouth daily.   HYDROCODONE-ACETAMINOPHEN (NORCO/VICODIN) 5-325 MG PER TABLET    Take 1 tablet by mouth every 6 (six) hours as needed.   HYDROCODONE-HOMATROPINE (HYCODAN) 5-1.5 MG/5ML SYRUP    Take 5 mLs by mouth every 8 (eight) hours as needed for cough.   LISINOPRIL  (PRINIVIL,ZESTRIL) 40 MG TABLET    Take 1 tablet (40 mg total) by mouth daily.   MELOXICAM (MOBIC) 15 MG TABLET    Take 1 tablet (15 mg total) by mouth daily as needed.   METHOCARBAMOL (ROBAXIN) 500 MG TABLET    Take 1 tablet (500 mg total) by mouth every 6 (six) hours as needed for muscle spasms.   OXYCODONE-ACETAMINOPHEN (PERCOCET/ROXICET) 5-325 MG PER TABLET    Take 1-2 tablets by mouth every 4 (four) hours as needed for moderate pain.   POTASSIUM CHLORIDE (K-DUR) 10 MEQ TABLET    Take 1 tablet (10 mEq total) by mouth daily.   SENNA (SENOKOT) 8.6 MG TABS TABLET    Take 1 tablet (8.6 mg total) by mouth 2 (two) times daily.  Modified Medications   No medications on file  Discontinued Medications   No medications on file     Physical Exam: Filed Vitals:   02/08/14 0932  BP: 130/90  Pulse: 68  Temp: 97.8 F (36.6 C)  Resp: 18  SpO2: 99%    General- adult female in no acute distress Head- atraumatic, normocephalic Eyes- PERRLA, EOMI, no pallor, no icterus, no discharge Neck- no cervical lymphadenopathy Ears- left ear normal tympanic membrane and normal external ear canal , right ear normal tympanic membrane and normal external ear canal Throat- moist mucus membrane, normal oropharynx Nose- normal nasal mucosa, no maxillary or frontal sinus tenderness Cardiovascular- normal s1,s2, no murmurs Respiratory- bilateral clear to auscultation, no wheeze, no rhonchi, no crackles, no use of accessory muscles Abdomen- bowel sounds present, soft, non tender Musculoskeletal- able to move all 4 extremities, no leg edema, strength 5/5 in all extremities Neurological- no focal deficit Skin- warm and dry, incision site on back area has glue and healing well, no signs of infection, dressing clean Psychiatry- alert and oriented to person, place and time, normal mood and affect    Labs reviewed: Basic Metabolic Panel:  Recent Labs  01/21/14 0854 02/03/14 0630  NA 142 140  K 4.0 4.1  CL  105 103  CO2 23 23  GLUCOSE 86 98  BUN 14 18  CREATININE 0.93 1.08  CALCIUM 9.7 8.5   Liver Function Tests:  Recent Labs  01/21/14 0854  AST 14  ALT 10  ALKPHOS 79  BILITOT 0.6  PROT 7.5  ALBUMIN 3.4*   No results for input(s): LIPASE, AMYLASE in the last 8760 hours. No results for input(s): AMMONIA in the last 8760 hours. CBC:  Recent Labs  04/06/13 0950 01/21/14 0854 02/03/14 0630 02/05/14 1040  WBC 11.0* 9.5 14.6* 14.5*  NEUTROABS 5.4  --   --   --   HGB 13.9 14.6 12.7 11.5*  HCT 43.3 43.8 38.5 34.3*  MCV 90.1 88.8 89.7 89.6  PLT 229.0 217 177 138*    Assessment/Plan  Lumbar spondylolisthesis  S/p laminectomy and fusion. Will have patient work with PT/OT as tolerated to regain strength and restore function.  Fall precautions are in place. On norco 5-325 q6h prn pain and percocet 5-325 1-2 tab q4h prn pain and neurontin 100 mg daily. On robaxin 500 mg q6h prn muscle spasm. Has a brace and to wear it while sitting. Has f/u with Dr Louanne Skye in 2 weeks. D/c norco. Have her on oxyER 10 mg bid and percocet 5-324 1-2 tab q4h prn pain. reassess  Constipation On dss 1 tab bid and senna bid, monitor, as we are stepping up pain therapy, add miralax 17 g po daily prn  HTN bp stable. Continue atenolol 100 mg daily, lisinopril 40 mg daily, clonidine 0.1 mg daily and baby aspirin  OA Continue mobic 15 mg daily  Acute URI On 5 days course of azithromycin with hycodan and prn albuterol.  Tobacco use Start nicotine patch 21 mg daily for 1 week, then 14 mg daily  Family/ staff Communication: reviewed care plan with patient and nursing supervisor   Goals of care: short term rehabilitation    Labs/tests ordered: cbc, cmp    Blanchie Serve, MD  [place] [phone] (Monday-Friday 8 am - 5 pm) [phone] (afterhours)

## 2014-02-09 ENCOUNTER — Other Ambulatory Visit: Payer: Self-pay | Admitting: *Deleted

## 2014-02-09 MED ORDER — OXYCODONE-ACETAMINOPHEN 5-325 MG PO TABS: ORAL_TABLET | ORAL | Status: DC

## 2014-02-09 NOTE — Telephone Encounter (Signed)
[facility] 

## 2014-02-19 ENCOUNTER — Non-Acute Institutional Stay (SKILLED_NURSING_FACILITY): Payer: Commercial Managed Care - HMO | Admitting: Adult Health

## 2014-02-19 DIAGNOSIS — M48062 Spinal stenosis, lumbar region with neurogenic claudication: Secondary | ICD-10-CM

## 2014-02-19 DIAGNOSIS — M4806 Spinal stenosis, lumbar region: Secondary | ICD-10-CM

## 2014-02-19 DIAGNOSIS — I10 Essential (primary) hypertension: Secondary | ICD-10-CM

## 2014-02-19 DIAGNOSIS — M179 Osteoarthritis of knee, unspecified: Secondary | ICD-10-CM

## 2014-02-19 DIAGNOSIS — K5900 Constipation, unspecified: Secondary | ICD-10-CM

## 2014-02-19 DIAGNOSIS — Z720 Tobacco use: Secondary | ICD-10-CM

## 2014-02-19 DIAGNOSIS — IMO0002 Reserved for concepts with insufficient information to code with codable children: Secondary | ICD-10-CM

## 2014-02-19 DIAGNOSIS — M1710 Unilateral primary osteoarthritis, unspecified knee: Secondary | ICD-10-CM

## 2014-02-22 ENCOUNTER — Encounter: Payer: Self-pay | Admitting: Adult Health

## 2014-02-22 NOTE — Progress Notes (Signed)
Patient ID: Kathy Herrera [facility], female   DOB: 1961/02/14, 53 y.o.   MRN: [id number]   02/22/2014  Facility:  Nursing Home Location:  [facility] Room Number: 103-P LEVEL OF CARE:  SNF (31)   Chief Complaint  Patient presents with  . Discharge Note    Spondylolisthesis of lumbar S/P Laminectomy and Fusion, Constipation, Hypertension, Tobacco Use and Osteoarthrosis    HISTORY OF PRESENT ILLNESS:  This is a 53 year old female who is for discharge home with home health PT and OT. DME: Rolling walker she has been admitted to [facility] on 02/05/14 from [facility] with spondylolisthesis of the lumbar region L4-5 and L5-S1 S/P Central laminectomy and transforaminal lumbar interbody fusion with rods and screw fix and bone graft. Her past medical history is significant for hypertension. Patient was admitted to this facility for short-term rehabilitation after the patient's recent hospitalization.  Patient has completed SNF rehabilitation and therapy has cleared the patient for discharge.   PAST MEDICAL HISTORY:  Past Medical History  Diagnosis Date  . DJD (degenerative joint disease) of lumbar spine   . Heart murmur   . Hypertension   . Hyperlipidemia   . GERD (gastroesophageal reflux disease)     not everyday and uses tums when needed    CURRENT MEDICATIONS: Reviewed per MAR/see medication list  Allergies  Allergen Reactions  . Shrimp [Shellfish Allergy] Hives    May be seasoning not always     REVIEW OF SYSTEMS:  GENERAL: no change in appetite, no fatigue, no weight changes, no fever, chills or weakness RESPIRATORY: no cough, SOB, DOE, wheezing, hemoptysis CARDIAC: no chest pain, edema or palpitations GI: no abdominal pain, diarrhea, constipation, heart burn, nausea or vomiting  PHYSICAL EXAMINATION  GENERAL: no acute distress, normal body habitus EYES: conjunctivae normal, sclerae normal, normal eye lids NECK: supple, trachea midline, no  neck masses, no thyroid tenderness, no thyromegaly LYMPHATICS: no LAN in the neck, no supraclavicular LAN RESPIRATORY: breathing is even & unlabored, BS CTAB CARDIAC: RRR, no murmur,no extra heart sounds, no edema GI: abdomen soft, normal BS, no masses, no tenderness, no hepatomegaly, no splenomegaly EXTREMITIES: Able to ambulate with walker PSYCHIATRIC: the patient is alert & oriented to person, affect & behavior appropriate  LABS/RADIOLOGY: 02/11/14  chest x-ray is negative for pneumonia 02/10/14  WBC 14.9 hemoglobin 11.9 hematocrit 36.4 MCV 89.2 Labs reviewed: Basic Metabolic Panel:  Recent Labs  01/21/14 0854 02/03/14 0630  NA 142 140  K 4.0 4.1  CL 105 103  CO2 23 23  GLUCOSE 86 98  BUN 14 18  CREATININE 0.93 1.08  CALCIUM 9.7 8.5   Liver Function Tests:  Recent Labs  01/21/14 0854  AST 14  ALT 10  ALKPHOS 79  BILITOT 0.6  PROT 7.5  ALBUMIN 3.4*   CBC:  Recent Labs  04/06/13 0950 01/21/14 0854 02/03/14 0630 02/05/14 1040  WBC 11.0* 9.5 14.6* 14.5*  NEUTROABS 5.4  --   --   --   HGB 13.9 14.6 12.7 11.5*  HCT 43.3 43.8 38.5 34.3*  MCV 90.1 88.8 89.7 89.6  PLT 229.0 217 177 138*   Dg Lumbar Spine Complete  02/02/2014   CLINICAL DATA:  Central laminectomy at L4-L5 and L5-S1, trans foraminal lumbar interbody fusion L4-L5 and L5-S1 with bone grafting, degenerative disc disease changes lumbar spine  EXAM: LUMBAR SPINE - COMPLETE 4+ VIEW; DG C-ARM GT 120 MIN  COMPARISON:  MRI lumbar spine 12/04/2013, lumbar  spine radiographs 12/04/2013  FINDINGS: Six digital C-arm fluoroscopic images obtained intraoperatively are compared to prior studies.  Prior MRI labeled with 5 lumbar vertebrae, current images labeled accordingly.  BILATERAL pedicle screws and posterior bars are present at L4-S1.  Disc prostheses present at L4-L5 and L5-S1 disc spaces, which appear narrowed.  No gross evidence of fracture or subluxation seen, though L5 and S1 are suboptimally visualized.   Hardware appears intact.  IMPRESSION: Posterior fusion of L4-S1.   Electronically Signed   By: Lavonia Dana M.D.   On: 02/02/2014 17:31   Dg C-arm Gt 120 Min  02/02/2014   CLINICAL DATA:  Central laminectomy at L4-L5 and L5-S1, trans foraminal lumbar interbody fusion L4-L5 and L5-S1 with bone grafting, degenerative disc disease changes lumbar spine  EXAM: LUMBAR SPINE - COMPLETE 4+ VIEW; DG C-ARM GT 120 MIN  COMPARISON:  MRI lumbar spine 12/04/2013, lumbar spine radiographs 12/04/2013  FINDINGS: Six digital C-arm fluoroscopic images obtained intraoperatively are compared to prior studies.  Prior MRI labeled with 5 lumbar vertebrae, current images labeled accordingly.  BILATERAL pedicle screws and posterior bars are present at L4-S1.  Disc prostheses present at L4-L5 and L5-S1 disc spaces, which appear narrowed.  No gross evidence of fracture or subluxation seen, though L5 and S1 are suboptimally visualized.  Hardware appears intact.  IMPRESSION: Posterior fusion of L4-S1.   Electronically Signed   By: Lavonia Dana M.D.   On: 02/02/2014 17:31    ASSESSMENT/PLAN:  Spondylolisthesis of the lumbar region S/P laminectomy and fusion - for home health PT and OT; continue aspirin 81 mg by mouth daily, Neurontin 100 mg by mouth daily, Percocet 5/325 mg 1-2 tabs by mouth every 4 hours when necessary for pain and Robaxin 500 mg by mouth every 6 hours when necessary for muscle spasm Constipation - stable; continue Colace 100 mg by mouth twice a day, senna 1 by mouth twice a day and MiraLAX 17 g by mouth daily when necessary Osteoarthrosis - continue Mobic 15 mg by mouth daily when necessary Tobacco use - continue nicotine patch 21 mg transdermally daily Hypertension - well controlled; continue atenolol 100 mg by mouth daily, lisinopril 40 mg by mouth daily and clonidine 0.1 mg by mouth daily   I have filled out patient's discharge paperwork and written prescriptions.  Patient will receive home health PT and  OT.  DME provided: Rolling walker  Total discharge time: Greater than 30 minutes  Discharge time involved coordination of the discharge process with Education officer, museum, nursing staff and therapy department. Medical justification for home health services/DME verified.     [facility], NP [employer] [phone]

## 2014-02-23 DIAGNOSIS — M5117 Intervertebral disc disorders with radiculopathy, lumbosacral region: Secondary | ICD-10-CM

## 2014-02-23 DIAGNOSIS — R5381 Other malaise: Secondary | ICD-10-CM

## 2014-02-23 DIAGNOSIS — M4316 Spondylolisthesis, lumbar region: Secondary | ICD-10-CM

## 2014-02-23 DIAGNOSIS — M4806 Spinal stenosis, lumbar region: Secondary | ICD-10-CM

## 2014-02-23 DIAGNOSIS — Z4889 Encounter for other specified surgical aftercare: Secondary | ICD-10-CM

## 2014-03-02 ENCOUNTER — Encounter (HOSPITAL_COMMUNITY): Payer: Self-pay | Admitting: Specialist

## 2014-03-18 DIAGNOSIS — M4806 Spinal stenosis, lumbar region: Secondary | ICD-10-CM | POA: Diagnosis not present

## 2014-03-18 DIAGNOSIS — M4317 Spondylolisthesis, lumbosacral region: Secondary | ICD-10-CM | POA: Diagnosis not present

## 2014-04-22 DIAGNOSIS — M4806 Spinal stenosis, lumbar region: Secondary | ICD-10-CM | POA: Diagnosis not present

## 2014-04-22 DIAGNOSIS — M4317 Spondylolisthesis, lumbosacral region: Secondary | ICD-10-CM | POA: Diagnosis not present

## 2014-05-25 ENCOUNTER — Other Ambulatory Visit: Payer: Self-pay | Admitting: Family Medicine

## 2014-05-25 NOTE — Telephone Encounter (Signed)
Pt last f/u appt 03/2013. Pt unable to have 90d supply. Pt may schedule appt and receive #30 until appt

## 2014-05-26 DIAGNOSIS — M4806 Spinal stenosis, lumbar region: Secondary | ICD-10-CM | POA: Diagnosis not present

## 2014-05-26 DIAGNOSIS — M4317 Spondylolisthesis, lumbosacral region: Secondary | ICD-10-CM | POA: Diagnosis not present

## 2014-07-02 DIAGNOSIS — H5210 Myopia, unspecified eye: Secondary | ICD-10-CM | POA: Diagnosis not present

## 2014-07-02 DIAGNOSIS — Z0100 Encounter for examination of eyes and vision without abnormal findings: Secondary | ICD-10-CM | POA: Diagnosis not present

## 2014-07-28 DIAGNOSIS — M4806 Spinal stenosis, lumbar region: Secondary | ICD-10-CM | POA: Diagnosis not present

## 2014-07-28 DIAGNOSIS — M4317 Spondylolisthesis, lumbosacral region: Secondary | ICD-10-CM | POA: Diagnosis not present

## 2014-08-31 ENCOUNTER — Other Ambulatory Visit: Payer: Self-pay | Admitting: Family Medicine

## 2014-08-31 MED ORDER — CLONIDINE HCL 0.1 MG PO TABS: 0.1000 mg | ORAL_TABLET | Freq: Every day | ORAL | Status: DC

## 2014-09-02 ENCOUNTER — Other Ambulatory Visit: Payer: Self-pay | Admitting: Family Medicine

## 2014-09-02 DIAGNOSIS — I10 Essential (primary) hypertension: Secondary | ICD-10-CM

## 2014-09-02 MED ORDER — ATENOLOL 100 MG PO TABS: 100.0000 mg | ORAL_TABLET | Freq: Every day | ORAL | Status: DC

## 2014-09-02 MED ORDER — GABAPENTIN 100 MG PO CAPS: 100.0000 mg | ORAL_CAPSULE | Freq: Every day | ORAL | Status: DC

## 2014-09-02 MED ORDER — LISINOPRIL 40 MG PO TABS: 40.0000 mg | ORAL_TABLET | Freq: Every day | ORAL | Status: DC

## 2014-09-02 MED ORDER — HYDROCHLOROTHIAZIDE 25 MG PO TABS: 25.0000 mg | ORAL_TABLET | Freq: Every day | ORAL | Status: DC

## 2014-10-21 DIAGNOSIS — M4806 Spinal stenosis, lumbar region: Secondary | ICD-10-CM | POA: Diagnosis not present

## 2014-10-21 DIAGNOSIS — M25562 Pain in left knee: Secondary | ICD-10-CM | POA: Diagnosis not present

## 2014-10-21 DIAGNOSIS — M25561 Pain in right knee: Secondary | ICD-10-CM | POA: Diagnosis not present

## 2014-10-21 DIAGNOSIS — M4317 Spondylolisthesis, lumbosacral region: Secondary | ICD-10-CM | POA: Diagnosis not present

## 2014-11-09 ENCOUNTER — Other Ambulatory Visit: Payer: Self-pay | Admitting: Specialist

## 2014-11-09 DIAGNOSIS — M25561 Pain in right knee: Secondary | ICD-10-CM

## 2014-11-16 ENCOUNTER — Ambulatory Visit
Admission: RE | Admit: 2014-11-16 | Discharge: 2014-11-16 | Disposition: A | Discharge status: Home / Self Care | Payer: Commercial Managed Care - HMO | Source: Ambulatory Visit | Attending: Specialist | Admitting: Specialist

## 2014-11-16 ENCOUNTER — Other Ambulatory Visit: Payer: Self-pay | Admitting: Family Medicine

## 2014-11-16 DIAGNOSIS — M25562 Pain in left knee: Secondary | ICD-10-CM | POA: Diagnosis not present

## 2014-11-16 DIAGNOSIS — S8332XA Tear of articular cartilage of left knee, current, initial encounter: Secondary | ICD-10-CM | POA: Diagnosis not present

## 2014-11-16 DIAGNOSIS — M25561 Pain in right knee: Secondary | ICD-10-CM

## 2014-11-25 DIAGNOSIS — M170 Bilateral primary osteoarthritis of knee: Secondary | ICD-10-CM | POA: Diagnosis not present

## 2014-11-25 DIAGNOSIS — M4806 Spinal stenosis, lumbar region: Secondary | ICD-10-CM | POA: Diagnosis not present

## 2014-11-25 DIAGNOSIS — M4317 Spondylolisthesis, lumbosacral region: Secondary | ICD-10-CM | POA: Diagnosis not present

## 2014-12-09 ENCOUNTER — Other Ambulatory Visit: Payer: Self-pay | Admitting: Family Medicine

## 2014-12-09 ENCOUNTER — Ambulatory Visit (INDEPENDENT_AMBULATORY_CARE_PROVIDER_SITE_OTHER): Payer: Commercial Managed Care - HMO | Admitting: Family Medicine

## 2014-12-09 ENCOUNTER — Encounter: Payer: Self-pay | Admitting: Family Medicine

## 2014-12-09 VITALS — BP 144/84 | HR 72 | Temp 98.2°F | Wt 253.5 lb

## 2014-12-09 DIAGNOSIS — M48062 Spinal stenosis, lumbar region with neurogenic claudication: Secondary | ICD-10-CM

## 2014-12-09 DIAGNOSIS — Z23 Encounter for immunization: Secondary | ICD-10-CM

## 2014-12-09 DIAGNOSIS — I10 Essential (primary) hypertension: Secondary | ICD-10-CM

## 2014-12-09 DIAGNOSIS — Z720 Tobacco use: Secondary | ICD-10-CM

## 2014-12-09 DIAGNOSIS — E785 Hyperlipidemia, unspecified: Secondary | ICD-10-CM

## 2014-12-09 DIAGNOSIS — M4806 Spinal stenosis, lumbar region: Secondary | ICD-10-CM

## 2014-12-09 DIAGNOSIS — R7989 Other specified abnormal findings of blood chemistry: Secondary | ICD-10-CM

## 2014-12-09 LAB — COMPREHENSIVE METABOLIC PANEL
ALT: 16 U/L (ref 0–35)
AST: 15 U/L (ref 0–37)
Albumin: 3.7 g/dL (ref 3.5–5.2)
Alkaline Phosphatase: 101 U/L (ref 39–117)
BUN: 15 mg/dL (ref 6–23)
CO2: 28 mEq/L (ref 19–32)
Calcium: 10.1 mg/dL (ref 8.4–10.5)
Chloride: 107 mEq/L (ref 96–112)
Creatinine, Ser: 1.03 mg/dL (ref 0.40–1.20)
GFR: 71.37 mL/min (ref >60.00)
Glucose, Bld: 86 mg/dL (ref 70–99)
Potassium: 4.1 mEq/L (ref 3.5–5.1)
Sodium: 141 mEq/L (ref 135–145)
Total Bilirubin: 0.5 mg/dL (ref 0.2–1.2)
Total Protein: 7.7 g/dL (ref 6.0–8.3)

## 2014-12-09 LAB — CBC WITH DIFFERENTIAL/PLATELET
Basophils Absolute: 0 10*3/uL (ref 0.0–0.1)
Basophils Relative: 0.3 % (ref 0.0–3.0)
Eosinophils Absolute: 0.2 10*3/uL (ref 0.0–0.7)
Eosinophils Relative: 1.9 % (ref 0.0–5.0)
HCT: 47.7 % — ABNORMAL HIGH (ref 36.0–46.0)
Hemoglobin: 15.8 g/dL — ABNORMAL HIGH (ref 12.0–15.0)
Lymphocytes Relative: 34 % (ref 12.0–46.0)
Lymphs Abs: 4.3 10*3/uL — ABNORMAL HIGH (ref 0.7–4.0)
MCHC: 33.2 g/dL (ref 30.0–36.0)
MCV: 86.9 fl (ref 78.0–100.0)
Monocytes Absolute: 0.9 10*3/uL (ref 0.1–1.0)
Monocytes Relative: 6.8 % (ref 3.0–12.0)
Neutro Abs: 7.3 10*3/uL (ref 1.4–7.7)
Neutrophils Relative %: 57 % (ref 43.0–77.0)
Platelets: 209 10*3/uL (ref 150.0–400.0)
RBC: 5.49 Mil/uL — ABNORMAL HIGH (ref 3.87–5.11)
RDW: 14.6 % (ref 11.5–15.5)
WBC: 12.7 10*3/uL — ABNORMAL HIGH (ref 4.0–10.5)

## 2014-12-09 LAB — TSH: TSH: 0.09 u[IU]/mL — ABNORMAL LOW (ref 0.35–4.50)

## 2014-12-09 LAB — LIPID PANEL
Cholesterol: 178 mg/dL (ref 0–200)
HDL: 41.3 mg/dL (ref >39.00)
LDL Cholesterol: 111 mg/dL — ABNORMAL HIGH (ref 0–99)
NonHDL: 136.82
Total CHOL/HDL Ratio: 4
Triglycerides: 129 mg/dL (ref 0.0–149.0)
VLDL: 25.8 mg/dL (ref 0.0–40.0)

## 2014-12-09 MED ORDER — LISINOPRIL 40 MG PO TABS: 40.0000 mg | ORAL_TABLET | Freq: Every day | ORAL | Status: DC

## 2014-12-09 MED ORDER — HYDROCHLOROTHIAZIDE 25 MG PO TABS: 25.0000 mg | ORAL_TABLET | Freq: Every day | ORAL | Status: DC

## 2014-12-09 MED ORDER — POTASSIUM CHLORIDE CRYS ER 10 MEQ PO TBCR: 10.0000 meq | EXTENDED_RELEASE_TABLET | Freq: Every day | ORAL | Status: DC

## 2014-12-09 MED ORDER — ATENOLOL 100 MG PO TABS: 100.0000 mg | ORAL_TABLET | Freq: Every day | ORAL | Status: DC

## 2014-12-09 MED ORDER — GABAPENTIN 100 MG PO CAPS: 100.0000 mg | ORAL_CAPSULE | Freq: Every day | ORAL | Status: DC

## 2014-12-09 NOTE — Progress Notes (Signed)
Pre visit review using our clinic review tool, if applicable. No additional management support is needed unless otherwise documented below in the visit note. 

## 2014-12-09 NOTE — Assessment & Plan Note (Signed)
Well controlled on current rxs. Advised to take her meds when she gets home today. Labs today. The patient indicates understanding of these issues and agrees with the plan.  Orders Placed This Encounter  Procedures  . Flu Vaccine QUAD 36+ mos PF IM (Fluarix & Fluzone Quad PF)  . CBC with Differential/Platelet  . Comprehensive metabolic panel  . Lipid panel  . TSH

## 2014-12-09 NOTE — Assessment & Plan Note (Signed)
Smoking cessation instruction/counseling given:  commended patient for quitting and reviewed strategies for preventing relapses 

## 2014-12-09 NOTE — Assessment & Plan Note (Signed)
Symptoms improved s/p surgery but still has some discomfort and mobility limitations. Advised to continue regular physical activity.

## 2014-12-09 NOTE — Progress Notes (Signed)
Subjective:    Patient ID: Kathy Herrera, female    DOB: 1960-10-03, 54 y.o.   MRN: [id number]  HPI Very pleasant 54 yo female here for follow up.  Spondylolisthesis of the lumbar region S/P laminectomy and fusion in 02/2014- was more hopefully that her back surgery would "fix everything" but still has pain and a limp. Pain and ROM improved but not as good as she hoped.  Osteoarthrosis -has been taking Mobic 15 mg by mouth daily when necessary  Tobacco use - still not ready to quit.  Hypertension - well controlled; continue atenolol 100 mg by mouth daily, lisinopril 40 mg by mouth daily and clonidine 0.1 mg by mouth daily Did not take her rxs today or yesterday.   BP Readings from Last 3 Encounters:  12/09/14 144/84  02/22/14 116/64  02/08/14 130/90   Lab Results  Component Value Date   CREATININE 1.08 02/03/2014     Wt Readings from Last 3 Encounters:  12/09/14 253 lb 8 oz (114.987 kg)  02/22/14 245 lb (111.131 kg)  02/02/14 242 lb (109.77 kg)     Lab Results  Component Value Date   CHOL 183 09/25/2012   HDL 31.70* 09/25/2012   LDLCALC 124* 09/25/2012   TRIG 135.0 09/25/2012   CHOLHDL 6 09/25/2012    Patient Active Problem List   Diagnosis Date Noted  . Spinal stenosis, lumbar region, with neurogenic claudication 02/02/2014    Class: Chronic  . Spondylolisthesis of lumbar region 02/02/2014    Class: Chronic  . Lumbar degenerative disc disease 02/02/2014    Class: Chronic  . Obesity, Class III, BMI 40-49.9 (morbid obesity) ([place]) 02/11/2013  . Nonspecific abnormal electrocardiogram (ECG) (EKG) 12/18/2012  . Abnormal CBC 10/24/2012  . Tobacco abuse 09/17/2012  . DJD (degenerative joint disease) of lumbar spine   . Hypertension   . Hyperlipidemia    Past Medical History  Diagnosis Date  . DJD (degenerative joint disease) of lumbar spine   . Heart murmur   . Hypertension   . Hyperlipidemia   . GERD (gastroesophageal reflux disease)     not everyday and  uses tums when needed   Past Surgical History  Procedure Laterality Date  . Abdominal hysterectomy      partial- still has cervix.  . Cholecystectomy    . Tonsillectomy and adenoidectomy    . Arm surgery Left   . Lumbar fusion N/A 02/02/2014    Procedure: CENTRAL LAMINECTOMY L4-5,L5-S1, TRANSFORAMINAL LUMBAR INTERBODY FUSION L4-5,L5-S1, RODS AND SCREW FIX  AND L5-S1, LOCAL BONE GRAFT;  Surgeon: Jessy Oto, MD;  Location: [place];  Service: Orthopedics;  Laterality: N/A;   Social History  Substance Use Topics  . Smoking status: Current Every Day Smoker -- 0.50 packs/day for 40 years    Types: Cigarettes  . Smokeless tobacco: None  . Alcohol Use: Yes     Comment: occassional -  holidays   Family History  Problem Relation Age of Onset  . Cancer Mother 87    cervical  . CVA Mother   . Hypertension Mother    Allergies  Allergen Reactions  . Shrimp [Shellfish Allergy] Hives    May be seasoning not always   Current Outpatient Prescriptions on File Prior to Visit  Medication Sig Dispense Refill  . aspirin EC 81 MG tablet Take 81 mg by mouth daily.    . cloNIDine (CATAPRES) 0.1 MG tablet Take 1 tablet (0.1 mg total) by mouth daily. 90 tablet 3   No  current facility-administered medications on file prior to visit.   The PMH, PSH, Social History, Family History, Medications, and allergies have been reviewed in [facility], and have been updated if relevant.   Review of Systems  Constitutional: Negative.   HENT: Negative.   Eyes: Negative.   Respiratory: Negative.   Cardiovascular: Negative.   Gastrointestinal: Negative.   Endocrine: Negative.   Genitourinary: Negative.   Musculoskeletal: Negative.   Skin: Negative.   Allergic/Immunologic: Negative.   Neurological: Negative.   Hematological: Negative.   Psychiatric/Behavioral: Negative.   All other systems reviewed and are negative.    Objective:   Physical Exam  Wt Readings from Last 3 Encounters:  12/09/14 253 lb 8 oz  (114.987 kg)  02/22/14 245 lb (111.131 kg)  02/02/14 242 lb (109.77 kg)   BP 144/84 mmHg  Pulse 72  Temp(Src) 98.2 F (36.8 C) (Oral)  Wt 253 lb 8 oz (114.987 kg)  SpO2 95%   General:  Obese, well-nourished,in no acute distress; alert,appropriate and cooperative throughout examination Head:  normocephalic and atraumatic.   Eyes:  vision grossly intact, pupils equal, pupils round, and pupils reactive to light.   Ears:  R ear normal and L ear normal.   Nose:  no external deformity.   Mouth:  good dentition.   Neck:  No deformities, masses, or tenderness noted. Heart:  Normal rate and regular rhythm. S1 and S2 normal without gallop, murmur, click, rub or other extra sounds. Msk:  No deformity or scoliosis noted of thoracic or lumbar spine.   Extremities:  No clubbing, cyanosis, edema, or deformity noted with normal full range of motion of all joints.   Neurologic:  alert & oriented X3 and gait normal.   Skin:  Intact without suspicious lesions or rashes Psych:  Cognition and judgment appear intact. Alert and cooperative with normal attention span and concentration. No apparent delusions, illusions, hallucinations      Assessment & Plan:

## 2014-12-14 ENCOUNTER — Other Ambulatory Visit: Payer: Self-pay | Admitting: Family Medicine

## 2014-12-14 ENCOUNTER — Other Ambulatory Visit (INDEPENDENT_AMBULATORY_CARE_PROVIDER_SITE_OTHER): Payer: Commercial Managed Care - HMO

## 2014-12-14 DIAGNOSIS — R946 Abnormal results of thyroid function studies: Secondary | ICD-10-CM

## 2014-12-14 DIAGNOSIS — R7989 Other specified abnormal findings of blood chemistry: Secondary | ICD-10-CM

## 2014-12-14 LAB — T3, FREE: T3, Free: 4.9 pg/mL — ABNORMAL HIGH (ref 2.3–4.2)

## 2014-12-14 LAB — T4, FREE: Free T4: 1.34 ng/dL (ref 0.60–1.60)

## 2014-12-14 LAB — TSH: TSH: 0.04 u[IU]/mL — ABNORMAL LOW (ref 0.35–4.50)

## 2014-12-22 ENCOUNTER — Encounter: Payer: Self-pay | Admitting: Endocrinology

## 2014-12-22 ENCOUNTER — Ambulatory Visit (INDEPENDENT_AMBULATORY_CARE_PROVIDER_SITE_OTHER): Payer: Commercial Managed Care - HMO | Admitting: Endocrinology

## 2014-12-22 VITALS — BP 130/88 | HR 90 | Temp 97.9°F | Resp 12 | Ht 67.0 in | Wt 255.8 lb

## 2014-12-22 DIAGNOSIS — E0590 Thyrotoxicosis, unspecified without thyrotoxic crisis or storm: Secondary | ICD-10-CM | POA: Diagnosis not present

## 2014-12-22 MED ORDER — METHIMAZOLE 10 MG PO TABS: 10.0000 mg | ORAL_TABLET | Freq: Two times a day (BID) | ORAL | Status: DC

## 2014-12-22 NOTE — Patient Instructions (Addendum)
i have sent a prescription to your pharmacy, to slow down the thyroid. if ever you have fever while taking methimazole, stop it and call us, even if the reason is obvious, because of the risk of a rare side-effect. Please come back for a follow-up appointment in 1 month.      Hyperthyroidism Hyperthyroidism is when the thyroid is too active (overactive). Your thyroid is a large gland that is located in your neck. The thyroid helps to control how your body uses food (metabolism). When your thyroid is overactive, it produces too much of a hormone called thyroxine.  CAUSES Causes of hyperthyroidism may include:  Graves disease. This is when your immune system attacks the thyroid gland. This is the most common cause.  Inflammation of the thyroid gland.  Tumor in the thyroid gland or somewhere else.  Excessive use of thyroid medicines, including:  Prescription thyroid supplement.  Herbal supplements that mimic thyroid hormones.  Solid or fluid-filled lumps within your thyroid gland (thyroid nodules).  Excessive ingestion of iodine. RISK FACTORS  Being female.  Having a family history of thyroid conditions. SIGNS AND SYMPTOMS Signs and symptoms of hyperthyroidism may include:  Nervousness.  Inability to tolerate heat.  Unexplained weight loss.  Diarrhea.  Change in the texture of hair or skin.  Heart skipping beats or making extra beats.  Rapid heart rate.  Loss of menstruation.  Shaky hands.  Fatigue.  Restlessness.  Increased appetite.  Sleep problems.  Enlarged thyroid gland or nodules. DIAGNOSIS  Diagnosis of hyperthyroidism may include:  Medical history and physical exam.  Blood tests.  Ultrasound tests. TREATMENT Treatment may include:  Medicines to control your thyroid.  Surgery to remove your thyroid.  Radiation therapy. HOME CARE INSTRUCTIONS   Take medicines only as directed by your health care provider.  Do not use any tobacco  products, including cigarettes, chewing tobacco, or electronic cigarettes. If you need help quitting, ask your health care provider.  Do not exercise or do physical activity until your health care provider approves.  Keep all follow-up appointments as directed by your health care provider. This is important. SEEK MEDICAL CARE IF:  Your symptoms do not get better with treatment.  You have fever.  You are taking thyroid replacement medicine and you:  Have depression.  Feel mentally and physically slow.  Have weight gain. SEEK IMMEDIATE MEDICAL CARE IF:   You have decreased alertness or a change in your awareness.  You have abdominal pain.  You feel dizzy.  You have a rapid heartbeat.  You have an irregular heartbeat.   This information is not intended to replace advice given to you by your health care provider. Make sure you discuss any questions you have with your health care provider.   Document Released: 02/19/2005 Document Revised: 03/12/2014 Document Reviewed: 07/07/2013 Elsevier Interactive Patient Education [employer].
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
## 2014-12-23 DIAGNOSIS — E0590 Thyrotoxicosis, unspecified without thyrotoxic crisis or storm: Secondary | ICD-10-CM | POA: Insufficient documentation

## 2015-01-21 ENCOUNTER — Encounter: Payer: Self-pay | Admitting: Endocrinology

## 2015-01-21 ENCOUNTER — Ambulatory Visit (INDEPENDENT_AMBULATORY_CARE_PROVIDER_SITE_OTHER): Payer: Commercial Managed Care - HMO | Admitting: Endocrinology

## 2015-01-21 VITALS — BP 140/87 | HR 74 | Temp 98.4°F | Ht 67.0 in | Wt 259.0 lb

## 2015-01-21 DIAGNOSIS — E0590 Thyrotoxicosis, unspecified without thyrotoxic crisis or storm: Secondary | ICD-10-CM | POA: Diagnosis not present

## 2015-01-21 LAB — TSH: TSH: 0.72 u[IU]/mL (ref 0.35–4.50)

## 2015-01-21 LAB — T4, FREE: Free T4: 0.44 ng/dL — ABNORMAL LOW (ref 0.60–1.60)

## 2015-01-21 MED ORDER — METHIMAZOLE 5 MG PO TABS: 5.0000 mg | ORAL_TABLET | Freq: Every day | ORAL | Status: DC

## 2015-01-21 NOTE — Patient Instructions (Addendum)
blood tests are requested for you today.  We'll let you know about the results. if ever you have fever while taking methimazole, stop it and call us, even if the reason is obvious, because of the risk of a rare side-effect. Please come back for a follow-up appointment in 2 months.  The difficulty with swallowing is very unlikely related to the thyroid.  However, i would be happy to order a special x-ray for this, or you could see a specialist.   Please let me or Dr Deborra Medina know.
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
## 2015-03-23 ENCOUNTER — Ambulatory Visit: Payer: Commercial Managed Care - HMO | Admitting: Endocrinology

## 2015-04-20 ENCOUNTER — Encounter: Payer: Self-pay | Admitting: Endocrinology

## 2015-04-20 ENCOUNTER — Ambulatory Visit (INDEPENDENT_AMBULATORY_CARE_PROVIDER_SITE_OTHER): Payer: Commercial Managed Care - HMO | Admitting: Endocrinology

## 2015-04-20 VITALS — BP 144/96 | HR 98 | Temp 98.8°F | Ht 67.0 in | Wt 267.0 lb

## 2015-04-20 DIAGNOSIS — R509 Fever, unspecified: Secondary | ICD-10-CM | POA: Diagnosis not present

## 2015-04-20 DIAGNOSIS — E0590 Thyrotoxicosis, unspecified without thyrotoxic crisis or storm: Secondary | ICD-10-CM | POA: Diagnosis not present

## 2015-04-20 LAB — CBC WITH DIFFERENTIAL/PLATELET
Basophils Absolute: 0 10*3/uL (ref 0.0–0.1)
Basophils Relative: 0.3 % (ref 0.0–3.0)
Eosinophils Absolute: 0 10*3/uL (ref 0.0–0.7)
Eosinophils Relative: 0.5 % (ref 0.0–5.0)
HCT: 47.9 % — ABNORMAL HIGH (ref 36.0–46.0)
Hemoglobin: 16.5 g/dL — ABNORMAL HIGH (ref 12.0–15.0)
Lymphocytes Relative: 34.8 % (ref 12.0–46.0)
Lymphs Abs: 2.7 10*3/uL (ref 0.7–4.0)
MCHC: 34.4 g/dL (ref 30.0–36.0)
MCV: 87.8 fl (ref 78.0–100.0)
Monocytes Absolute: 0.9 10*3/uL (ref 0.1–1.0)
Monocytes Relative: 12.2 % — ABNORMAL HIGH (ref 3.0–12.0)
Neutro Abs: 4.1 10*3/uL (ref 1.4–7.7)
Neutrophils Relative %: 52.2 % (ref 43.0–77.0)
Platelets: 197 10*3/uL (ref 150.0–400.0)
RBC: 5.45 Mil/uL — ABNORMAL HIGH (ref 3.87–5.11)
RDW: 15.7 % — ABNORMAL HIGH (ref 11.5–15.5)
WBC: 7.8 10*3/uL (ref 4.0–10.5)

## 2015-04-20 LAB — T4, FREE: Free T4: 0.72 ng/dL (ref 0.60–1.60)

## 2015-04-20 LAB — TSH: TSH: 5.83 u[IU]/mL — ABNORMAL HIGH (ref 0.35–4.50)

## 2015-04-20 MED ORDER — METHIMAZOLE 5 MG PO TABS: 5.0000 mg | ORAL_TABLET | ORAL | Status: DC
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
## 2015-04-20 NOTE — Patient Instructions (Signed)
blood tests are requested for you today.  We'll let you know about the results. Please come back for a follow-up appointment in 3 months.  if ever you have fever while taking methimazole, stop it and call us, even if the reason is obvious, because of the risk of a rare side-effect.

## 2015-04-29 ENCOUNTER — Other Ambulatory Visit: Payer: Self-pay | Admitting: Family Medicine

## 2015-04-29 DIAGNOSIS — R7989 Other specified abnormal findings of blood chemistry: Secondary | ICD-10-CM

## 2015-04-29 DIAGNOSIS — E785 Hyperlipidemia, unspecified: Secondary | ICD-10-CM

## 2015-04-29 DIAGNOSIS — I10 Essential (primary) hypertension: Secondary | ICD-10-CM

## 2015-04-29 DIAGNOSIS — E0590 Thyrotoxicosis, unspecified without thyrotoxic crisis or storm: Secondary | ICD-10-CM

## 2015-05-06 ENCOUNTER — Other Ambulatory Visit (INDEPENDENT_AMBULATORY_CARE_PROVIDER_SITE_OTHER): Payer: Commercial Managed Care - HMO

## 2015-05-06 DIAGNOSIS — R7989 Other specified abnormal findings of blood chemistry: Secondary | ICD-10-CM

## 2015-05-06 DIAGNOSIS — E0590 Thyrotoxicosis, unspecified without thyrotoxic crisis or storm: Secondary | ICD-10-CM

## 2015-05-06 DIAGNOSIS — E785 Hyperlipidemia, unspecified: Secondary | ICD-10-CM

## 2015-05-06 LAB — LIPID PANEL
Cholesterol: 221 mg/dL — ABNORMAL HIGH (ref 0–200)
HDL: 38.1 mg/dL — ABNORMAL LOW (ref >39.00)
LDL Cholesterol: 145 mg/dL — ABNORMAL HIGH (ref 0–99)
NonHDL: 183.25
Total CHOL/HDL Ratio: 6
Triglycerides: 190 mg/dL — ABNORMAL HIGH (ref 0.0–149.0)
VLDL: 38 mg/dL (ref 0.0–40.0)

## 2015-05-06 LAB — CBC WITH DIFFERENTIAL/PLATELET
Basophils Absolute: 0.1 10*3/uL (ref 0.0–0.1)
Basophils Relative: 0.4 % (ref 0.0–3.0)
Eosinophils Absolute: 0.4 10*3/uL (ref 0.0–0.7)
Eosinophils Relative: 3.1 % (ref 0.0–5.0)
HCT: 47.9 % — ABNORMAL HIGH (ref 36.0–46.0)
Hemoglobin: 16 g/dL — ABNORMAL HIGH (ref 12.0–15.0)
Lymphocytes Relative: 33.7 % (ref 12.0–46.0)
Lymphs Abs: 4.6 10*3/uL — ABNORMAL HIGH (ref 0.7–4.0)
MCHC: 33.4 g/dL (ref 30.0–36.0)
MCV: 89.8 fl (ref 78.0–100.0)
Monocytes Absolute: 0.9 10*3/uL (ref 0.1–1.0)
Monocytes Relative: 6.9 % (ref 3.0–12.0)
Neutro Abs: 7.6 10*3/uL (ref 1.4–7.7)
Neutrophils Relative %: 55.9 % (ref 43.0–77.0)
Platelets: 237 10*3/uL (ref 150.0–400.0)
RBC: 5.34 Mil/uL — ABNORMAL HIGH (ref 3.87–5.11)
RDW: 15.1 % (ref 11.5–15.5)
WBC: 13.7 10*3/uL — ABNORMAL HIGH (ref 4.0–10.5)

## 2015-05-06 LAB — COMPREHENSIVE METABOLIC PANEL
ALT: 10 U/L (ref 0–35)
AST: 14 U/L (ref 0–37)
Albumin: 4 g/dL (ref 3.5–5.2)
Alkaline Phosphatase: 87 U/L (ref 39–117)
BUN: 15 mg/dL (ref 6–23)
CO2: 31 mEq/L (ref 19–32)
Calcium: 9.8 mg/dL (ref 8.4–10.5)
Chloride: 102 mEq/L (ref 96–112)
Creatinine, Ser: 1.15 mg/dL (ref 0.40–1.20)
GFR: 62.76 mL/min (ref >60.00)
Glucose, Bld: 121 mg/dL — ABNORMAL HIGH (ref 70–99)
Potassium: 3.9 mEq/L (ref 3.5–5.1)
Sodium: 140 mEq/L (ref 135–145)
Total Bilirubin: 0.4 mg/dL (ref 0.2–1.2)
Total Protein: 7.7 g/dL (ref 6.0–8.3)

## 2015-05-06 LAB — TSH: TSH: 15.58 u[IU]/mL — ABNORMAL HIGH (ref 0.35–4.50)

## 2015-05-09 ENCOUNTER — Other Ambulatory Visit: Payer: Self-pay | Admitting: Endocrinology

## 2015-05-11 ENCOUNTER — Telehealth: Payer: Self-pay | Admitting: Endocrinology

## 2015-05-11 ENCOUNTER — Ambulatory Visit (INDEPENDENT_AMBULATORY_CARE_PROVIDER_SITE_OTHER): Payer: Commercial Managed Care - HMO | Admitting: Family Medicine

## 2015-05-11 ENCOUNTER — Encounter: Payer: Self-pay | Admitting: Family Medicine

## 2015-05-11 VITALS — BP 138/82 | HR 70 | Temp 97.9°F | Ht 66.0 in | Wt 270.3 lb

## 2015-05-11 DIAGNOSIS — E785 Hyperlipidemia, unspecified: Secondary | ICD-10-CM | POA: Diagnosis not present

## 2015-05-11 DIAGNOSIS — Z0000 Encounter for general adult medical examination without abnormal findings: Secondary | ICD-10-CM | POA: Diagnosis not present

## 2015-05-11 DIAGNOSIS — E0590 Thyrotoxicosis, unspecified without thyrotoxic crisis or storm: Secondary | ICD-10-CM

## 2015-05-11 DIAGNOSIS — I10 Essential (primary) hypertension: Secondary | ICD-10-CM

## 2015-05-11 DIAGNOSIS — Z720 Tobacco use: Secondary | ICD-10-CM

## 2015-05-11 DIAGNOSIS — M47816 Spondylosis without myelopathy or radiculopathy, lumbar region: Secondary | ICD-10-CM

## 2015-05-11 DIAGNOSIS — Z01419 Encounter for gynecological examination (general) (routine) without abnormal findings: Secondary | ICD-10-CM | POA: Insufficient documentation

## 2015-05-11 MED ORDER — HYDROCHLOROTHIAZIDE 25 MG PO TABS
25.0000 mg | ORAL_TABLET | Freq: Every day | ORAL | Status: DC
Start: 2015-05-11 — End: 2015-09-14

## 2015-05-11 NOTE — Telephone Encounter (Signed)
I contacted the pt and advised of note below. Pt scheduled her appointment for 06/01/2015.

## 2015-05-11 NOTE — Assessment & Plan Note (Signed)
Well controlled on current rxs. No changes made. 

## 2015-05-11 NOTE — Assessment & Plan Note (Signed)
Smoking cessation instruction/counseling given:  counseled patient on the dangers of tobacco use, advised patient to stop smoking, and reviewed strategies to maximize success 

## 2015-05-11 NOTE — Telephone Encounter (Signed)
please call patient: Your thyroid is low D/c methimazole Please come back for a follow-up appointment in 2-3 weeks

## 2015-05-11 NOTE — Progress Notes (Signed)
Subjective:    Patient ID: [place], female    DOB: August 09, 1960, 55 y.o.   MRN: [id number]  HPI Very pleasant 55 yo female here with her husband for annual medicare wellness visit and follow up of chronic medical conditions.  I have personally reviewed the Medicare Annual Wellness questionnaire and have noted 1. The patient's medical and social history 2. Their use of alcohol, tobacco or illicit drugs 3. Their current medications and supplements 4. The patient's functional ability including ADL's, fall risks, home safety risks and hearing or visual             impairment. 5. Diet and physical activities 6. Evidence for depression or mood disorders  End of life wishes discussed and updated in Social History.  The roster of all physicians providing medical care to patient - is listed in the Care[employer] section of the chart.  Influenza vaccine 12/09/14 Remote h/o hysterectomy Colonoscopy 07/18/12 Mammogram 10/17/12   HTN - HTN for over 10 years.  Now well controlled on HCTZ, Clonidine, atenolol and Lisinopril.   Denies any HA, blurred vision, CP or SOB. BP Readings from Last 3 Encounters:  04/20/15 144/96  01/21/15 140/87  12/22/14 130/88   Hyperthyroidism- currently followed by Dr. Loanne Drilling. TSH very elevated this month.  She is on methimazole.     Lab Results  Component Value Date   TSH 15.58* 05/06/2015   Tobacco abuse- smokes cigars.  Has tried patches and gums, have not worked.  She does want to quit smoking.  Prescribed Chantix on 7/31.    Wt Readings from Last 3 Encounters:  04/20/15 267 lb (121.11 kg)  01/21/15 259 lb (117.482 kg)  12/22/14 255 lb 12.8 oz (116.03 kg)     Lab Results  Component Value Date   CHOL 221* 05/06/2015   HDL 38.10* 05/06/2015   LDLCALC 145* 05/06/2015   TRIG 190.0* 05/06/2015   CHOLHDL 6 05/06/2015    Patient Active Problem List   Diagnosis Date Noted  . Well woman exam 05/11/2015  . Medicare annual wellness visit, subsequent  05/11/2015  . Hyperthyroidism 12/23/2014  . Spinal stenosis, lumbar region, with neurogenic claudication 02/02/2014    Class: Chronic  . Spondylolisthesis of lumbar region 02/02/2014    Class: Chronic  . Lumbar degenerative disc disease 02/02/2014    Class: Chronic  . Obesity, Class III, BMI 40-49.9 (morbid obesity) ([place]) 02/11/2013  . Abnormal CBC 10/24/2012  . Tobacco abuse 09/17/2012  . Lumbar spondylosis   . Hypertension   . Hyperlipidemia    Past Medical History  Diagnosis Date  . DJD (degenerative joint disease) of lumbar spine   . Heart murmur   . Hypertension   . Hyperlipidemia   . GERD (gastroesophageal reflux disease)     not everyday and uses tums when needed   Past Surgical History  Procedure Laterality Date  . Abdominal hysterectomy      partial- still has cervix.  . Cholecystectomy    . Tonsillectomy and adenoidectomy    . Arm surgery Left   . Lumbar fusion N/A 02/02/2014    Procedure: CENTRAL LAMINECTOMY L4-5,L5-S1, TRANSFORAMINAL LUMBAR INTERBODY FUSION L4-5,L5-S1, RODS AND SCREW FIX  AND L5-S1, LOCAL BONE GRAFT;  Surgeon: Jessy Oto, MD;  Location: [facility];  Service: Orthopedics;  Laterality: N/A;   Social History  Substance Use Topics  . Smoking status: Current Every Day Smoker -- 0.50 packs/day for 40 years    Types: Cigarettes  . Smokeless tobacco: Not on  file  . Alcohol Use: Yes     Comment: occassional -  holidays   Family History  Problem Relation Age of Onset  . Cancer Mother 56    cervical  . CVA Mother   . Hypertension Mother   . Thyroid disease Daughter    Allergies  Allergen Reactions  . Shrimp [Shellfish Allergy] Hives    May be seasoning not always   Current Outpatient Prescriptions on File Prior to Visit  Medication Sig Dispense Refill  . aspirin EC 81 MG tablet Take 81 mg by mouth daily.    Marland Kitchen atenolol (TENORMIN) 100 MG tablet Take 1 tablet (100 mg total) by mouth daily. 90 tablet 3  . cloNIDine (CATAPRES) 0.1 MG tablet Take 1  tablet (0.1 mg total) by mouth daily. 90 tablet 3  . diclofenac (VOLTAREN) 50 MG EC tablet Take 50 mg by mouth 3 (three) times daily.    Marland Kitchen gabapentin (NEURONTIN) 100 MG capsule Take 1 capsule (100 mg total) by mouth daily. 90 capsule 3  . hydrochlorothiazide (HYDRODIURIL) 25 MG tablet Take 1 tablet (25 mg total) by mouth daily. 90 tablet 1  . lisinopril (PRINIVIL,ZESTRIL) 40 MG tablet Take 1 tablet (40 mg total) by mouth daily. 90 tablet 3  . potassium chloride (K-DUR,KLOR-CON) 10 MEQ tablet Take 1 tablet (10 mEq total) by mouth daily. 90 tablet 3   No current facility-administered medications on file prior to visit.   The PMH, PSH, Social History, Family History, Medications, and allergies have been reviewed in [facility], and have been updated if relevant.   Review of Systems  Constitutional: Negative.   HENT: Negative.   Eyes: Negative.   Respiratory: Negative.   Cardiovascular: Negative.   Gastrointestinal: Negative.   Endocrine: Positive for heat intolerance.  Genitourinary: Negative.   Musculoskeletal: Negative.   Skin: Negative.   Allergic/Immunologic: Negative.   Neurological: Negative.   Hematological: Negative.   Psychiatric/Behavioral: Negative.        Objective:   Physical Exam BP 138/82 mmHg  Pulse 70  Temp(Src) 97.9 F (36.6 C) (Oral)  Ht 5\' 6"  (1.676 m)  Wt 270 lb 5 oz (122.613 kg)  BMI 43.65 kg/m2  SpO2 97%  Wt Readings from Last 3 Encounters:  04/20/15 267 lb (121.11 kg)  01/21/15 259 lb (117.482 kg)  12/22/14 255 lb 12.8 oz (116.03 kg)      General:  Well-developed,well-nourished,in no acute distress; alert,appropriate and cooperative throughout examination Head:  normocephalic and atraumatic.   Eyes:  vision grossly intact, pupils equal, pupils round, and pupils reactive to light.   Ears:  R ear normal and L ear normal.   Nose:  no external deformity.   Mouth:  good dentition.   Neck:  No deformities, masses, or tenderness noted. Breasts:  No mass,  nodules, thickening, tenderness, bulging, retraction, inflamation, nipple discharge or skin changes noted.   Lungs:  Normal respiratory effort, chest expands symmetrically. Lungs are clear to auscultation, no crackles or wheezes. Heart:  Normal rate and regular rhythm. S1 and S2 normal without gallop, murmur, click, rub or other extra sounds. Abdomen:  Bowel sounds positive,abdomen soft and non-tender without masses, organomegaly or hernias noted. Msk:  No deformity or scoliosis noted of thoracic or lumbar spine.   Extremities:  No clubbing, cyanosis, edema, or deformity noted with normal full range of motion of all joints.   Neurologic:  alert & oriented X3 and gait normal.   Skin:  Intact without suspicious lesions or rashes Cervical Nodes:  No lymphadenopathy noted Axillary Nodes:  No palpable lymphadenopathy Psych:  Cognition and judgment appear intact. Alert and cooperative with normal attention span and concentration. No apparent delusions, illusions, hallucinations       Assessment & Plan:

## 2015-05-11 NOTE — Assessment & Plan Note (Signed)
The patients weight, height, BMI and visual acuity have been recorded in the chart I have made referrals, counseling and provided education to the patient based review of the above and I have provided the pt with a written personalized care plan for preventive services.  

## 2015-05-11 NOTE — Assessment & Plan Note (Signed)
Deteriorated. ? Not accurate due to elevated TSH. Given handout on low cholesterol diet. Recheck lipid panel in 6 months. The patient indicates understanding of these issues and agrees with the plan.

## 2015-05-11 NOTE — Progress Notes (Signed)
Pre visit review using our clinic review tool, if applicable. No additional management support is needed unless otherwise documented below in the visit note. 

## 2015-05-11 NOTE — Assessment & Plan Note (Signed)
I am send a message to her endocrinologist as she is now HYPOTHYROID with current rx. Awaiting response from Dr. Loanne Drilling for medication management.

## 2015-05-11 NOTE — Patient Instructions (Addendum)
Please call to schedule your mammogram.  Please work on lowering cholesterol in your diet like we discussed.  Good to see you.

## 2015-06-01 ENCOUNTER — Encounter: Payer: Self-pay | Admitting: Endocrinology

## 2015-06-01 ENCOUNTER — Ambulatory Visit (INDEPENDENT_AMBULATORY_CARE_PROVIDER_SITE_OTHER): Payer: Commercial Managed Care - HMO | Admitting: Endocrinology

## 2015-06-01 VITALS — BP 126/84 | HR 81 | Temp 98.4°F | Ht 66.0 in | Wt 265.0 lb

## 2015-06-01 DIAGNOSIS — E0590 Thyrotoxicosis, unspecified without thyrotoxic crisis or storm: Secondary | ICD-10-CM | POA: Diagnosis not present

## 2015-06-01 LAB — TSH: TSH: 0.6 u[IU]/mL (ref 0.35–4.50)

## 2015-06-01 LAB — T4, FREE: Free T4: 1.3 ng/dL (ref 0.60–1.60)
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
## 2015-06-01 NOTE — Patient Instructions (Addendum)
blood tests are requested for you today.  We'll let you know about the results.   Please come back for a follow-up appointment in 2 months.  

## 2015-07-04 DIAGNOSIS — R198 Other specified symptoms and signs involving the digestive system and abdomen: Secondary | ICD-10-CM

## 2015-07-04 HISTORY — DX: Other specified symptoms and signs involving the digestive system and abdomen: R19.8

## 2015-07-13 ENCOUNTER — Telehealth: Payer: Self-pay | Admitting: Family Medicine

## 2015-07-13 ENCOUNTER — Encounter (HOSPITAL_COMMUNITY): Payer: Self-pay | Admitting: Nurse Practitioner

## 2015-07-13 ENCOUNTER — Inpatient Hospital Stay (HOSPITAL_COMMUNITY)
Admission: EM | Admit: 2015-07-13 | Discharge: 2015-07-21 | DRG: 385 | Disposition: A | Discharge status: Home / Self Care | Payer: Commercial Managed Care - HMO | Attending: Internal Medicine | Admitting: Internal Medicine

## 2015-07-13 ENCOUNTER — Emergency Department (HOSPITAL_COMMUNITY): Payer: Commercial Managed Care - HMO

## 2015-07-13 DIAGNOSIS — K5289 Other specified noninfective gastroenteritis and colitis: Secondary | ICD-10-CM | POA: Diagnosis not present

## 2015-07-13 DIAGNOSIS — N179 Acute kidney failure, unspecified: Secondary | ICD-10-CM | POA: Diagnosis present

## 2015-07-13 DIAGNOSIS — K5000 Crohn's disease of small intestine without complications: Principal | ICD-10-CM | POA: Diagnosis present

## 2015-07-13 DIAGNOSIS — I10 Essential (primary) hypertension: Secondary | ICD-10-CM | POA: Diagnosis not present

## 2015-07-13 DIAGNOSIS — M549 Dorsalgia, unspecified: Secondary | ICD-10-CM | POA: Diagnosis present

## 2015-07-13 DIAGNOSIS — Z809 Family history of malignant neoplasm, unspecified: Secondary | ICD-10-CM | POA: Diagnosis not present

## 2015-07-13 DIAGNOSIS — E876 Hypokalemia: Secondary | ICD-10-CM | POA: Diagnosis present

## 2015-07-13 DIAGNOSIS — R1031 Right lower quadrant pain: Secondary | ICD-10-CM | POA: Diagnosis present

## 2015-07-13 DIAGNOSIS — F17210 Nicotine dependence, cigarettes, uncomplicated: Secondary | ICD-10-CM | POA: Diagnosis present

## 2015-07-13 DIAGNOSIS — R935 Abnormal findings on diagnostic imaging of other abdominal regions, including retroperitoneum: Secondary | ICD-10-CM | POA: Insufficient documentation

## 2015-07-13 DIAGNOSIS — R933 Abnormal findings on diagnostic imaging of other parts of digestive tract: Secondary | ICD-10-CM | POA: Diagnosis not present

## 2015-07-13 DIAGNOSIS — R69 Illness, unspecified: Secondary | ICD-10-CM | POA: Diagnosis not present

## 2015-07-13 DIAGNOSIS — K529 Noninfective gastroenteritis and colitis, unspecified: Secondary | ICD-10-CM

## 2015-07-13 DIAGNOSIS — K631 Perforation of intestine (nontraumatic): Secondary | ICD-10-CM | POA: Diagnosis present

## 2015-07-13 DIAGNOSIS — Z6841 Body Mass Index (BMI) 40.0 and over, adult: Secondary | ICD-10-CM | POA: Diagnosis not present

## 2015-07-13 DIAGNOSIS — K219 Gastro-esophageal reflux disease without esophagitis: Secondary | ICD-10-CM | POA: Diagnosis present

## 2015-07-13 DIAGNOSIS — R1030 Lower abdominal pain, unspecified: Secondary | ICD-10-CM | POA: Diagnosis not present

## 2015-07-13 DIAGNOSIS — Z9049 Acquired absence of other specified parts of digestive tract: Secondary | ICD-10-CM

## 2015-07-13 DIAGNOSIS — R1011 Right upper quadrant pain: Secondary | ICD-10-CM | POA: Diagnosis not present

## 2015-07-13 DIAGNOSIS — N289 Disorder of kidney and ureter, unspecified: Secondary | ICD-10-CM | POA: Diagnosis not present

## 2015-07-13 DIAGNOSIS — R1084 Generalized abdominal pain: Secondary | ICD-10-CM | POA: Diagnosis not present

## 2015-07-13 DIAGNOSIS — E785 Hyperlipidemia, unspecified: Secondary | ICD-10-CM | POA: Diagnosis present

## 2015-07-13 DIAGNOSIS — R198 Other specified symptoms and signs involving the digestive system and abdomen: Secondary | ICD-10-CM | POA: Diagnosis present

## 2015-07-13 HISTORY — DX: Other specified symptoms and signs involving the digestive system and abdomen: R19.8

## 2015-07-13 LAB — CBC
HCT: 47.1 % — ABNORMAL HIGH (ref 36.0–46.0)
Hemoglobin: 15.5 g/dL — ABNORMAL HIGH (ref 12.0–15.0)
MCH: 28.7 pg (ref 26.0–34.0)
MCHC: 32.9 g/dL (ref 30.0–36.0)
MCV: 87.2 fL (ref 78.0–100.0)
Platelets: 243 10*3/uL (ref 150–400)
RBC: 5.4 MIL/uL — ABNORMAL HIGH (ref 3.87–5.11)
RDW: 13.9 % (ref 11.5–15.5)
WBC: 10.6 10*3/uL — ABNORMAL HIGH (ref 4.0–10.5)

## 2015-07-13 LAB — URINALYSIS, ROUTINE W REFLEX MICROSCOPIC
Glucose, UA: NEGATIVE mg/dL
Hgb urine dipstick: NEGATIVE
Ketones, ur: 15 mg/dL — AB
Leukocytes, UA: NEGATIVE
Nitrite: NEGATIVE
Protein, ur: NEGATIVE mg/dL
Specific Gravity, Urine: 1.024 (ref 1.005–1.030)
pH: 6 (ref 5.0–8.0)

## 2015-07-13 LAB — COMPREHENSIVE METABOLIC PANEL
ALT: 13 U/L — ABNORMAL LOW (ref 14–54)
AST: 16 U/L (ref 15–41)
Albumin: 3.4 g/dL — ABNORMAL LOW (ref 3.5–5.0)
Alkaline Phosphatase: 93 U/L (ref 38–126)
Anion gap: 11 (ref 5–15)
BUN: 15 mg/dL (ref 6–20)
CO2: 26 mmol/L (ref 22–32)
Calcium: 10.1 mg/dL (ref 8.9–10.3)
Chloride: 101 mmol/L (ref 101–111)
Creatinine, Ser: 1.21 mg/dL — ABNORMAL HIGH (ref 0.44–1.00)
GFR calc Af Amer: 57 mL/min — ABNORMAL LOW (ref >60)
GFR calc non Af Amer: 49 mL/min — ABNORMAL LOW (ref >60)
Glucose, Bld: 135 mg/dL — ABNORMAL HIGH (ref 65–99)
Potassium: 3.9 mmol/L (ref 3.5–5.1)
Sodium: 138 mmol/L (ref 135–145)
Total Bilirubin: 0.6 mg/dL (ref 0.3–1.2)
Total Protein: 7.6 g/dL (ref 6.5–8.1)

## 2015-07-13 LAB — TROPONIN I: Troponin I: <0.03 ng/mL (ref <0.031)

## 2015-07-13 MED ORDER — ONDANSETRON HCL 4 MG/2ML IJ SOLN
4.0000 mg | Freq: Once | INTRAMUSCULAR | Status: AC
  Administered 2015-07-13: 4 mg via INTRAVENOUS
  Filled 2015-07-13: qty 2

## 2015-07-13 MED ORDER — SODIUM CHLORIDE 0.9 % IV BOLUS (SEPSIS)
1000.0000 mL | Freq: Once | INTRAVENOUS | Status: AC
  Administered 2015-07-13: 1000 mL via INTRAVENOUS

## 2015-07-13 MED ORDER — IOPAMIDOL (ISOVUE-300) INJECTION 61%
INTRAVENOUS | Status: AC
  Administered 2015-07-13: 100 mL
  Filled 2015-07-13: qty 100

## 2015-07-13 MED ORDER — MORPHINE SULFATE (PF) 4 MG/ML IV SOLN
4.0000 mg | Freq: Once | INTRAVENOUS | Status: AC
  Administered 2015-07-13: 4 mg via INTRAVENOUS
  Filled 2015-07-13: qty 1

## 2015-07-13 NOTE — ED Notes (Signed)
Attempted IV placement x2 unsuccessfully.

## 2015-07-13 NOTE — Telephone Encounter (Signed)
TH scheduled appt on 07/14/15 at [facility] with Dr Deborra Medina.

## 2015-07-13 NOTE — ED Provider Notes (Signed)
CSN: [id number]     Arrival date & time 07/13/15  1517 History   First MD Initiated Contact with Patient 07/13/15 2034     Chief Complaint  Patient presents with  . Abdominal Pain     (Consider location/radiation/quality/duration/timing/severity/associated sxs/prior Treatment) Patient is a 54 y.o. female presenting with abdominal pain. The history is provided by the patient. No language interpreter was used.  Abdominal Pain Pain location:  RUQ Pain radiates to:  R flank Pain severity:  Moderate Onset quality:  Gradual Duration:  4 days Timing:  Constant Progression:  Waxing and waning Chronicity:  New Relieved by:  Nothing Ineffective treatments: She had Percocet at home which provided no relief. Associated symptoms: constipation, nausea and vomiting   Associated symptoms: no chest pain, no chills, no dysuria, no fever and no shortness of breath   Associated symptoms comment:  Patient with history of HTN, GERD, HLD, cholecystectomy (greater than 10 years ago) and thyroid disease presents with complaint of pain in the right flank > RUQ abdominal pain x 4 days. Pain has been constant, increasing and decreasing in intensity, unaffected by eating/drinking. No fever. She has experienced nausea and vomiting. She reports constipation x 24 hours. No increased belching and she continues to pass flatus. No chest pain, SOB, cough.   Past Medical History  Diagnosis Date  . DJD (degenerative joint disease) of lumbar spine   . Heart murmur   . Hypertension   . Hyperlipidemia   . GERD (gastroesophageal reflux disease)     not everyday and uses tums when needed   Past Surgical History  Procedure Laterality Date  . Abdominal hysterectomy      partial- still has cervix.  . Cholecystectomy    . Tonsillectomy and adenoidectomy    . Arm surgery Left   . Lumbar fusion N/A 02/02/2014    Procedure: CENTRAL LAMINECTOMY L4-5,L5-S1, TRANSFORAMINAL LUMBAR INTERBODY FUSION L4-5,L5-S1, RODS AND SCREW  FIX  AND L5-S1, LOCAL BONE GRAFT;  Surgeon: Jessy Oto, MD;  Location: [place];  Service: Orthopedics;  Laterality: N/A;   Family History  Problem Relation Age of Onset  . Cancer Mother 64    cervical  . CVA Mother   . Hypertension Mother   . Thyroid disease Daughter    Social History  Substance Use Topics  . Smoking status: Current Every Day Smoker -- 0.50 packs/day for 40 years    Types: Cigarettes  . Smokeless tobacco: None  . Alcohol Use: Yes     Comment: occassional -  holidays   OB History    No data available     Review of Systems  Constitutional: Negative for fever and chills.  Respiratory: Negative.  Negative for shortness of breath.   Cardiovascular: Negative.  Negative for chest pain.  Gastrointestinal: Positive for nausea, vomiting, abdominal pain and constipation.  Genitourinary: Negative.  Negative for dysuria and frequency.  Musculoskeletal: Negative.  Negative for myalgias.  Skin: Negative.   Neurological: Negative.  Negative for weakness.      Allergies  Shrimp  Home Medications   Prior to Admission medications   Medication Sig Start Date End Date Taking? Authorizing Provider  aspirin EC 81 MG tablet Take 81 mg by mouth daily.   Yes Historical Provider, MD  atenolol (TENORMIN) 100 MG tablet Take 1 tablet (100 mg total) by mouth daily. 12/09/14  Yes Lucille Passy, MD  cloNIDine (CATAPRES) 0.1 MG tablet Take 1 tablet (0.1 mg total) by mouth daily. 08/31/14  Yes  Lucille Passy, MD  diclofenac (VOLTAREN) 50 MG EC tablet Take 50 mg by mouth 3 (three) times daily.   Yes Historical Provider, MD  hydrochlorothiazide (HYDRODIURIL) 25 MG tablet Take 1 tablet (25 mg total) by mouth daily. 05/11/15  Yes Lucille Passy, MD  lisinopril (PRINIVIL,ZESTRIL) 40 MG tablet Take 1 tablet (40 mg total) by mouth daily. 12/09/14  Yes Lucille Passy, MD  potassium chloride (K-DUR,KLOR-CON) 10 MEQ tablet Take 1 tablet (10 mEq total) by mouth daily. 12/09/14  Yes Lucille Passy, MD   BP  163/90 mmHg  Pulse 73  Temp(Src) 99 F (37.2 C) (Oral)  Resp 18  Ht 5\' 7"  (1.702 m)  Wt 117.935 kg  BMI 40.71 kg/m2  SpO2 96% Physical Exam  Constitutional: She is oriented to person, place, and time. She appears well-developed and well-nourished.  HENT:  Head: Normocephalic.  Neck: Normal range of motion. Neck supple.  Cardiovascular: Normal rate and regular rhythm.   Pulmonary/Chest: Effort normal and breath sounds normal.  Abdominal: Soft. Bowel sounds are normal. There is tenderness (RUQ tenderness, mild). There is no rebound and no guarding.  Morbidly obese abdomen.  Genitourinary:  No right CVA tenderness.   Musculoskeletal: Normal range of motion.  Neurological: She is alert and oriented to person, place, and time.  Skin: Skin is warm and dry. No rash noted.  Psychiatric: She has a normal mood and affect.    ED Course  Procedures (including critical care time) Labs Review Labs Reviewed  COMPREHENSIVE METABOLIC PANEL - Abnormal; Notable for the following:    Glucose, Bld 135 (*)    Creatinine, Ser 1.21 (*)    Albumin 3.4 (*)    ALT 13 (*)    GFR calc non Af Amer 49 (*)    GFR calc Af Amer 57 (*)    All other components within normal limits  CBC - Abnormal; Notable for the following:    WBC 10.6 (*)    RBC 5.40 (*)    Hemoglobin 15.5 (*)    HCT 47.1 (*)    All other components within normal limits  URINALYSIS, ROUTINE W REFLEX MICROSCOPIC (NOT AT [facility]) - Abnormal; Notable for the following:    Bilirubin Urine SMALL (*)    Ketones, ur 15 (*)    All other components within normal limits  TROPONIN I   Results for orders placed or performed during the hospital encounter of 07/13/15  Comprehensive metabolic panel  Result Value Ref Range   Sodium 138 135 - 145 mmol/L   Potassium 3.9 3.5 - 5.1 mmol/L   Chloride 101 101 - 111 mmol/L   CO2 26 22 - 32 mmol/L   Glucose, Bld 135 (H) 65 - 99 mg/dL   BUN 15 6 - 20 mg/dL   Creatinine, Ser 1.21 (H) 0.44 - 1.00 mg/dL    Calcium 10.1 8.9 - 10.3 mg/dL   Total Protein 7.6 6.5 - 8.1 g/dL   Albumin 3.4 (L) 3.5 - 5.0 g/dL   AST 16 15 - 41 U/L   ALT 13 (L) 14 - 54 U/L   Alkaline Phosphatase 93 38 - 126 U/L   Total Bilirubin 0.6 0.3 - 1.2 mg/dL   GFR calc non Af Amer 49 (L) >60 mL/min   GFR calc Af Amer 57 (L) >60 mL/min   Anion gap 11 5 - 15  CBC  Result Value Ref Range   WBC 10.6 (H) 4.0 - 10.5 K/uL   RBC 5.40 (H)  3.87 - 5.11 MIL/uL   Hemoglobin 15.5 (H) 12.0 - 15.0 g/dL   HCT 47.1 (H) 36.0 - 46.0 %   MCV 87.2 78.0 - 100.0 fL   MCH 28.7 26.0 - 34.0 pg   MCHC 32.9 30.0 - 36.0 g/dL   RDW 13.9 11.5 - 15.5 %   Platelets 243 150 - 400 K/uL  Urinalysis, Routine w reflex microscopic  Result Value Ref Range   Color, Urine YELLOW YELLOW   APPearance CLEAR CLEAR   Specific Gravity, Urine 1.024 1.005 - 1.030   pH 6.0 5.0 - 8.0   Glucose, UA NEGATIVE NEGATIVE mg/dL   Hgb urine dipstick NEGATIVE NEGATIVE   Bilirubin Urine SMALL (A) NEGATIVE   Ketones, ur 15 (A) NEGATIVE mg/dL   Protein, ur NEGATIVE NEGATIVE mg/dL   Nitrite NEGATIVE NEGATIVE   Leukocytes, UA NEGATIVE NEGATIVE  Troponin I  Result Value Ref Range   Troponin I <0.03 <0.031 ng/mL   Ct Abdomen Pelvis W Contrast  07/14/2015  CLINICAL DATA:  Right lower quadrant pain for 3 days. EXAM: CT ABDOMEN AND PELVIS WITH CONTRAST TECHNIQUE: Multidetector CT imaging of the abdomen and pelvis was performed using the standard protocol following bolus administration of intravenous contrast. CONTRAST:  136mL ISOVUE-300 IOPAMIDOL (ISOVUE-300) INJECTION 61% COMPARISON:  None. FINDINGS: Lower chest:  No significant abnormality Hepatobiliary: Cholecystectomy. There are normal appearances of the liver and bile ducts. Pancreas: Normal Spleen: Normal Adrenals/Urinary Tract: The adrenals and kidneys are normal in appearance. There is no urinary calculus evident. There is no hydronephrosis or ureteral dilatation. Collecting systems and ureters appear unremarkable.  Stomach/Bowel: The stomach is normal. Proximal small bowel is normal. The terminal ileum is abnormal, with moderate dilatation and mural thickening. 10 cm proximal to the ileocecal valve, there is a segment of ileum which is more focally thickened, with very prominent inflammatory stranding extending into the mesentery and with 1 or 2 bubbles of air either within the small bowel wall or in the mesentery. This has the appearance of a focal contained perforation. This acutely inflamed segment of small bowel measures several cm in length but the remainder of the ileum distal to this over the next 10 cm is abnormal all way to the ileocecal valve. Proximal to this, the small bowel appears normal. There is no bowel obstruction. There is no free intraperitoneal air. There is no ascites. Colon is remarkable only for uncomplicated diverticulosis. Vascular/Lymphatic: There is mild aneurysmal dilatation of the distal abdominal aorta, up to 3.5 cm maximum AP dimension at the bifurcation, with generous mural thrombus. 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 The left common iliac artery is mildly aneurysmal at 1.5 cm. Reproductive: Hysterectomy.  No adnexal abnormalities. Other: Musculoskeletal: No significant musculoskeletal lesion. There is posterior decompression with instrumented fusion from L4 through S1, utilizing pedicle screws and vertical interconnecting hardware, as well as interbody fusion cages. IMPRESSION: 1. Markedly abnormal segment of distal ileum, 10 cm proximal to the ileocecal valve. This has a focal contained perforation and intense inflammation. Leading considerations are inflammatory bowel disease or an ulcerated small bowel neoplasm. 2. Diverticulosis. 3. 3.5 cm distal abdominal aortic aneurysm, continuing to the bifurcation. 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 4. These results will be called to the ordering clinician or representative by the Radiologist Assistant, and  communication documented in the PACS or zVision Dashboard. Electronically Signed   By: Andreas Newport M.D.   On: 07/14/2015 00:13     Imaging Review No results  found. I have personally reviewed and evaluated these images and lab results as part of my medical decision-making.   EKG Interpretation None      MDM   Final diagnoses:  None    1. Abdominal pain 2. enterocolitis  Patient presents with complaint of right sided abdominal and flank pain x 4 days. No fever. Infrequent N, V. No BM in 24 hours - unusual for her - continues to pass flatus. Lab studies reassuring. Patient is morbidly obese, advanced age. Will get CT to evaluate abdominal pain.  12:20 - CT scan sowing abnormal results including 10 cm segment of distal ileum with contained perforation and intense inflammation, IBD vs ulcerated small bowel neoplasm. Patient re-evaluated. Non-tender abdomen. Comfortable without complaint.   Discussed with Dr. Hulen Skains who advises no indication for surgical intervention based on results. GI paged for consultation and recommendation as to inpatient vs outpatient management. [facility] advises medical admit for further evaluation and he will consult inpatient. Cipro and Flagyl started. Discussed with Dr. Hal Hope who accepts the patient for admission.  Charlann Lange, PA-C 07/14/15 [id number]  Sherwood Gambler, MD 07/14/15 1520

## 2015-07-13 NOTE — Telephone Encounter (Signed)
Patient Name: Kathy Herrera DOB: 29-Aug-1960 Initial Comment Caller States bad pain on the right side almost in mid-back, sort of in the kidney area Nurse Assessment Nurse: Marcelline Deist, RN, Kermit Balo Date/Time (Eastern Time): 07/13/2015 9:17:18 AM Confirm and document reason for call. If symptomatic, describe symptoms. You must click the next button to save text entered. ---Caller states she has constant bad pain on the right side almost in mid-back, sort of in the kidney area. Worse with movement. Symptoms for 4 days. No urination symptoms. No fever. Has had nausea & vomiting. Has the patient traveled out of the country within the last 30 days? ---Not Applicable Does the patient have any new or worsening symptoms? ---Yes Will a triage be completed? ---Yes Related visit to physician within the last 2 weeks? ---No Does the PT have any chronic conditions? (i.e. diabetes, asthma, etc.) ---Yes List chronic conditions. ---high BP, on rx, was on thyroid rx, arthritis Is this a behavioral health or substance abuse call? ---No Guidelines Guideline Title Affirmed Question Affirmed Notes Back Pain [1] SEVERE back pain (e.g., excruciating, unable to do any normal activities) AND [2] not improved 2 hours after pain medicine Final Disposition User See Physician within 4 Hours (or PCP triage) Marcelline Deist, RN, Lynda Referrals REFERRED TO PCP OFFICE Disagree/Comply: Leta Baptist

## 2015-07-13 NOTE — ED Notes (Signed)
She c/o 3 day history RLQ pain now radiating into her R flank. She began to vomit today. She also reports her vagina has felt numb today. She reports her last BM was yesterday. Denies urinary changes, fevers.

## 2015-07-14 ENCOUNTER — Ambulatory Visit: Payer: Self-pay | Admitting: Family Medicine

## 2015-07-14 ENCOUNTER — Encounter (HOSPITAL_COMMUNITY): Payer: Self-pay | Admitting: Internal Medicine

## 2015-07-14 DIAGNOSIS — R69 Illness, unspecified: Secondary | ICD-10-CM | POA: Diagnosis not present

## 2015-07-14 DIAGNOSIS — K631 Perforation of intestine (nontraumatic): Secondary | ICD-10-CM | POA: Diagnosis present

## 2015-07-14 DIAGNOSIS — N179 Acute kidney failure, unspecified: Secondary | ICD-10-CM | POA: Diagnosis present

## 2015-07-14 DIAGNOSIS — R935 Abnormal findings on diagnostic imaging of other abdominal regions, including retroperitoneum: Secondary | ICD-10-CM | POA: Diagnosis not present

## 2015-07-14 DIAGNOSIS — E876 Hypokalemia: Secondary | ICD-10-CM | POA: Diagnosis present

## 2015-07-14 DIAGNOSIS — K529 Noninfective gastroenteritis and colitis, unspecified: Secondary | ICD-10-CM | POA: Insufficient documentation

## 2015-07-14 DIAGNOSIS — K219 Gastro-esophageal reflux disease without esophagitis: Secondary | ICD-10-CM | POA: Diagnosis present

## 2015-07-14 DIAGNOSIS — R198 Other specified symptoms and signs involving the digestive system and abdomen: Secondary | ICD-10-CM | POA: Diagnosis present

## 2015-07-14 DIAGNOSIS — Z809 Family history of malignant neoplasm, unspecified: Secondary | ICD-10-CM | POA: Diagnosis not present

## 2015-07-14 DIAGNOSIS — I10 Essential (primary) hypertension: Secondary | ICD-10-CM | POA: Diagnosis present

## 2015-07-14 DIAGNOSIS — Z6841 Body Mass Index (BMI) 40.0 and over, adult: Secondary | ICD-10-CM | POA: Diagnosis not present

## 2015-07-14 DIAGNOSIS — R1031 Right lower quadrant pain: Secondary | ICD-10-CM | POA: Diagnosis present

## 2015-07-14 DIAGNOSIS — M549 Dorsalgia, unspecified: Secondary | ICD-10-CM | POA: Diagnosis present

## 2015-07-14 DIAGNOSIS — K5000 Crohn's disease of small intestine without complications: Secondary | ICD-10-CM | POA: Diagnosis present

## 2015-07-14 DIAGNOSIS — E785 Hyperlipidemia, unspecified: Secondary | ICD-10-CM | POA: Diagnosis present

## 2015-07-14 DIAGNOSIS — Z9049 Acquired absence of other specified parts of digestive tract: Secondary | ICD-10-CM | POA: Diagnosis not present

## 2015-07-14 DIAGNOSIS — F17210 Nicotine dependence, cigarettes, uncomplicated: Secondary | ICD-10-CM | POA: Diagnosis present

## 2015-07-14 LAB — LACTIC ACID, PLASMA
Lactic Acid, Venous: 1.5 mmol/L (ref 0.5–2.0)
Lactic Acid, Venous: 1 mmol/L (ref 0.5–2.0)

## 2015-07-14 LAB — CBC WITH DIFFERENTIAL/PLATELET
Basophils Absolute: 0 10*3/uL (ref 0.0–0.1)
Basophils Relative: 0 %
Eosinophils Absolute: 0.2 10*3/uL (ref 0.0–0.7)
Eosinophils Relative: 2 %
HCT: 40.7 % (ref 36.0–46.0)
Hemoglobin: 13.1 g/dL (ref 12.0–15.0)
Lymphocytes Relative: 37 %
Lymphs Abs: 4.1 10*3/uL — ABNORMAL HIGH (ref 0.7–4.0)
MCH: 28.1 pg (ref 26.0–34.0)
MCHC: 32.2 g/dL (ref 30.0–36.0)
MCV: 87.2 fL (ref 78.0–100.0)
Monocytes Absolute: 0.9 10*3/uL (ref 0.1–1.0)
Monocytes Relative: 8 %
Neutro Abs: 6 10*3/uL (ref 1.7–7.7)
Neutrophils Relative %: 53 %
Platelets: 220 10*3/uL (ref 150–400)
RBC: 4.67 MIL/uL (ref 3.87–5.11)
RDW: 14 % (ref 11.5–15.5)
WBC: 11.3 10*3/uL — ABNORMAL HIGH (ref 4.0–10.5)

## 2015-07-14 LAB — COMPREHENSIVE METABOLIC PANEL
ALT: 12 U/L — ABNORMAL LOW (ref 14–54)
AST: 14 U/L — ABNORMAL LOW (ref 15–41)
Albumin: 3 g/dL — ABNORMAL LOW (ref 3.5–5.0)
Alkaline Phosphatase: 79 U/L (ref 38–126)
Anion gap: 11 (ref 5–15)
BUN: 14 mg/dL (ref 6–20)
CO2: 26 mmol/L (ref 22–32)
Calcium: 9.1 mg/dL (ref 8.9–10.3)
Chloride: 103 mmol/L (ref 101–111)
Creatinine, Ser: 1.05 mg/dL — ABNORMAL HIGH (ref 0.44–1.00)
GFR calc Af Amer: >60 mL/min (ref >60)
GFR calc non Af Amer: 58 mL/min — ABNORMAL LOW (ref >60)
Glucose, Bld: 98 mg/dL (ref 65–99)
Potassium: 3.5 mmol/L (ref 3.5–5.1)
Sodium: 140 mmol/L (ref 135–145)
Total Bilirubin: 0.6 mg/dL (ref 0.3–1.2)
Total Protein: 6.5 g/dL (ref 6.5–8.1)

## 2015-07-14 LAB — GLUCOSE, CAPILLARY
Glucose-Capillary: 96 mg/dL (ref 65–99)
Glucose-Capillary: 105 mg/dL — ABNORMAL HIGH (ref 65–99)
Glucose-Capillary: 110 mg/dL — ABNORMAL HIGH (ref 65–99)

## 2015-07-14 MED ORDER — CLONIDINE HCL 0.1 MG/24HR TD PTWK
0.1000 mg | MEDICATED_PATCH | TRANSDERMAL | Status: DC
  Administered 2015-07-14 – 2015-07-21 (×2): 0.1 mg via TRANSDERMAL
  Filled 2015-07-14 (×2): qty 1

## 2015-07-14 MED ORDER — ACETAMINOPHEN 650 MG RE SUPP: 650.0000 mg | Freq: Four times a day (QID) | RECTAL | Status: DC | PRN

## 2015-07-14 MED ORDER — SODIUM CHLORIDE 0.9 % IV SOLN
INTRAVENOUS | Status: DC
  Administered 2015-07-14: 03:00:00 via INTRAVENOUS

## 2015-07-14 MED ORDER — CIPROFLOXACIN IN D5W 400 MG/200ML IV SOLN
400.0000 mg | Freq: Once | INTRAVENOUS | Status: AC
  Administered 2015-07-14: 400 mg via INTRAVENOUS
  Filled 2015-07-14: qty 200

## 2015-07-14 MED ORDER — WHITE PETROLATUM GEL
Status: AC
Start: 2015-07-14 — End: 2015-07-14
  Administered 2015-07-14: 11:00:00
  Filled 2015-07-14: qty 1

## 2015-07-14 MED ORDER — ONDANSETRON HCL 4 MG/2ML IJ SOLN
4.0000 mg | Freq: Four times a day (QID) | INTRAMUSCULAR | Status: DC | PRN
  Administered 2015-07-20: 4 mg via INTRAVENOUS
  Filled 2015-07-14: qty 2

## 2015-07-14 MED ORDER — METRONIDAZOLE IN NACL 5-0.79 MG/ML-% IV SOLN
500.0000 mg | Freq: Once | INTRAVENOUS | Status: AC
  Administered 2015-07-14: 500 mg via INTRAVENOUS
  Filled 2015-07-14: qty 100

## 2015-07-14 MED ORDER — ACETAMINOPHEN 325 MG PO TABS: 650.0000 mg | ORAL_TABLET | Freq: Four times a day (QID) | ORAL | Status: DC | PRN

## 2015-07-14 MED ORDER — METOPROLOL TARTRATE 5 MG/5ML IV SOLN
2.5000 mg | Freq: Four times a day (QID) | INTRAVENOUS | Status: DC
  Administered 2015-07-14 – 2015-07-21 (×20): 2.5 mg via INTRAVENOUS
  Filled 2015-07-14 (×21): qty 5

## 2015-07-14 MED ORDER — DEXTROSE-NACL 5-0.9 % IV SOLN
INTRAVENOUS | Status: DC
  Administered 2015-07-14 (×2): via INTRAVENOUS

## 2015-07-14 MED ORDER — PIPERACILLIN-TAZOBACTAM 3.375 G IVPB
3.3750 g | Freq: Three times a day (TID) | INTRAVENOUS | Status: DC
  Administered 2015-07-14 – 2015-07-21 (×20): 3.375 g via INTRAVENOUS
  Filled 2015-07-14 (×23): qty 50

## 2015-07-14 MED ORDER — CHLORHEXIDINE GLUCONATE 0.12 % MT SOLN
15.0000 mL | Freq: Two times a day (BID) | OROMUCOSAL | Status: DC
  Administered 2015-07-14 – 2015-07-15 (×4): 15 mL via OROMUCOSAL
  Filled 2015-07-14 (×4): qty 15

## 2015-07-14 MED ORDER — MORPHINE SULFATE (PF) 2 MG/ML IV SOLN
1.0000 mg | INTRAVENOUS | Status: DC | PRN
  Administered 2015-07-14 – 2015-07-16 (×12): 1 mg via INTRAVENOUS
  Filled 2015-07-14 (×13): qty 1

## 2015-07-14 MED ORDER — ONDANSETRON HCL 4 MG PO TABS: 4.0000 mg | ORAL_TABLET | Freq: Four times a day (QID) | ORAL | Status: DC | PRN

## 2015-07-14 MED ORDER — CETYLPYRIDINIUM CHLORIDE 0.05 % MT LIQD
7.0000 mL | Freq: Two times a day (BID) | OROMUCOSAL | Status: DC
  Administered 2015-07-14 – 2015-07-15 (×4): 7 mL via OROMUCOSAL

## 2015-07-14 MED ORDER — PIPERACILLIN-TAZOBACTAM 3.375 G IVPB 30 MIN
3.3750 g | Freq: Once | INTRAVENOUS | Status: AC
  Administered 2015-07-14: 3.375 g via INTRAVENOUS
  Filled 2015-07-14: qty 50

## 2015-07-14 NOTE — H&P (Addendum)
History and Physical    [name] [facility] [id number] DOB: [birth year] DOA: 07/13/2015  PCP: Arnette Norris, MD  Patient coming from: Home.  Chief Complaint: Abdominal pain.  HPI: Kathy Herrera is a 55 y.o. female with medical history significant of hypertension, hyperthyroidism in remission presents to the ER because of abdominal pain. Patient has been having right lower quadrant abdominal pain with radiation to the right flank area over the last 4 days. Patient has been having some nausea and started vomiting yesterday multiple times. Denies any diarrhea. CT of the abdomen done shows abnormal area of ileum 10 cm from the ileocecal valve with contained perforation. He had physician and discuss with Dr. Hulen Skains on call surgeon and also on-call [facility] gastroenterologist. At this time general surgery feels patient can be managed nonsurgically. Patient has been started on IV antibiotics.   ED Course: IV antibiotics.  Review of Systems: As per HPI otherwise 10 point review of systems negative.    Past Medical History  Diagnosis Date  . DJD (degenerative joint disease) of lumbar spine   . Heart murmur   . Hypertension   . Hyperlipidemia   . GERD (gastroesophageal reflux disease)     not everyday and uses tums when needed    Past Surgical History  Procedure Laterality Date  . Abdominal hysterectomy      partial- still has cervix.  . Cholecystectomy    . Tonsillectomy and adenoidectomy    . Arm surgery Left   . Lumbar fusion N/A 02/02/2014    Procedure: CENTRAL LAMINECTOMY L4-5,L5-S1, TRANSFORAMINAL LUMBAR INTERBODY FUSION L4-5,L5-S1, RODS AND SCREW FIX  AND L5-S1, LOCAL BONE GRAFT;  Surgeon: Jessy Oto, MD;  Location: [place];  Service: Orthopedics;  Laterality: N/A;     reports that she has been smoking Cigarettes.  She has a 20 pack-year smoking history. She does not have any smokeless tobacco history on file. She reports that she drinks alcohol. She reports that she does not use illicit  drugs.  Allergies  Allergen Reactions  . Shrimp [Shellfish Allergy] Hives    May be seasoning not always    Family History  Problem Relation Age of Onset  . Cancer Mother 41    cervical  . CVA Mother   . Hypertension Mother   . Thyroid disease Daughter     Prior to Admission medications   Medication Sig Start Date End Date Taking? Authorizing Provider  aspirin EC 81 MG tablet Take 81 mg by mouth daily.   Yes Historical Provider, MD  atenolol (TENORMIN) 100 MG tablet Take 1 tablet (100 mg total) by mouth daily. 12/09/14  Yes Lucille Passy, MD  cloNIDine (CATAPRES) 0.1 MG tablet Take 1 tablet (0.1 mg total) by mouth daily. 08/31/14  Yes Lucille Passy, MD  diclofenac (VOLTAREN) 50 MG EC tablet Take 50 mg by mouth 3 (three) times daily.   Yes Historical Provider, MD  hydrochlorothiazide (HYDRODIURIL) 25 MG tablet Take 1 tablet (25 mg total) by mouth daily. 05/11/15  Yes Lucille Passy, MD  lisinopril (PRINIVIL,ZESTRIL) 40 MG tablet Take 1 tablet (40 mg total) by mouth daily. 12/09/14  Yes Lucille Passy, MD  potassium chloride (K-DUR,KLOR-CON) 10 MEQ tablet Take 1 tablet (10 mEq total) by mouth daily. 12/09/14  Yes Lucille Passy, MD    Physical Exam: Filed Vitals:   07/13/15 1556 07/13/15 1845  BP: 150/86 163/90  Pulse: 70 73  Temp: 98.1 F (36.7 C) 99 F (37.2 C)  TempSrc: Oral Oral  Resp: 20 18  Height: 5\' 7"  (1.702 m)   Weight: 260 lb (117.935 kg)   SpO2: 95% 96%      Constitutional: Not in distress. Filed Vitals:   07/13/15 1556 07/13/15 1845  BP: 150/86 163/90  Pulse: 70 73  Temp: 98.1 F (36.7 C) 99 F (37.2 C)  TempSrc: Oral Oral  Resp: 20 18  Height: 5\' 7"  (1.702 m)   Weight: 260 lb (117.935 kg)   SpO2: 95% 96%   Eyes: Anicteric no pallor. ENMT: No discharge from the ears eyes nose and mouth. Neck: No mass felt. Respiratory: No rhonchi or crepitations. Cardiovascular: S1-S2 heard. Abdomen: Mild tenderness in the right lower quadrant. Musculoskeletal: No  edema. Skin: No rash. Neurologic: Alert awake oriented to time place and person. Moves all extremities. Psychiatric: Appears normal.   Labs on Admission: I have personally reviewed following labs and imaging studies  CBC:  Recent Labs Lab 07/13/15 1621  WBC 10.6*  HGB 15.5*  HCT 47.1*  MCV 87.2  PLT 0000000   Basic Metabolic Panel:  Recent Labs Lab 07/13/15 1621  NA 138  K 3.9  CL 101  CO2 26  GLUCOSE 135*  BUN 15  CREATININE 1.21*  CALCIUM 10.1   GFR: Estimated Creatinine Clearance: 68.9 mL/min (by C-G formula based on Cr of 1.21). Liver Function Tests:  Recent Labs Lab 07/13/15 1621  AST 16  ALT 13*  ALKPHOS 93  BILITOT 0.6  PROT 7.6  ALBUMIN 3.4*   No results for input(s): LIPASE, AMYLASE in the last 168 hours. No results for input(s): AMMONIA in the last 168 hours. Coagulation Profile: No results for input(s): INR, PROTIME in the last 168 hours. Cardiac Enzymes:  Recent Labs Lab 07/13/15 2209  TROPONINI <0.03   BNP (last 3 results) No results for input(s): PROBNP in the last 8760 hours. HbA1C: No results for input(s): HGBA1C in the last 72 hours. CBG: No results for input(s): GLUCAP in the last 168 hours. Lipid Profile: No results for input(s): CHOL, HDL, LDLCALC, TRIG, CHOLHDL, LDLDIRECT in the last 72 hours. Thyroid Function Tests: No results for input(s): TSH, T4TOTAL, FREET4, T3FREE, THYROIDAB in the last 72 hours. Anemia Panel: No results for input(s): VITAMINB12, FOLATE, FERRITIN, TIBC, IRON, RETICCTPCT in the last 72 hours. Urine analysis:    Component Value Date/Time   COLORURINE YELLOW 07/13/2015 1904   APPEARANCEUR CLEAR 07/13/2015 1904   LABSPEC 1.024 07/13/2015 1904   PHURINE 6.0 07/13/2015 1904   GLUCOSEU NEGATIVE 07/13/2015 1904   HGBUR NEGATIVE 07/13/2015 1904   BILIRUBINUR SMALL* 07/13/2015 1904   KETONESUR 15* 07/13/2015 1904   PROTEINUR NEGATIVE 07/13/2015 1904   UROBILINOGEN 0.2 01/21/2014 0854   NITRITE NEGATIVE  07/13/2015 1904   LEUKOCYTESUR NEGATIVE 07/13/2015 1904   Sepsis Labs: @LABRCNTIP (procalcitonin:4,lacticidven:4) )No results found for this or any previous visit (from the past 240 hour(s)).   Radiological Exams on Admission: Ct Abdomen Pelvis W Contrast  07/14/2015  CLINICAL DATA:  Right lower quadrant pain for 3 days. EXAM: CT ABDOMEN AND PELVIS WITH CONTRAST TECHNIQUE: Multidetector CT imaging of the abdomen and pelvis was performed using the standard protocol following bolus administration of intravenous contrast. CONTRAST:  185mL ISOVUE-300 IOPAMIDOL (ISOVUE-300) INJECTION 61% COMPARISON:  None. FINDINGS: Lower chest:  No significant abnormality Hepatobiliary: Cholecystectomy. There are normal appearances of the liver and bile ducts. Pancreas: Normal Spleen: Normal Adrenals/Urinary Tract: The adrenals and kidneys are normal in appearance. There is no urinary calculus evident. There is no  hydronephrosis or ureteral dilatation. Collecting systems and ureters appear unremarkable. Stomach/Bowel: The stomach is normal. Proximal small bowel is normal. The terminal ileum is abnormal, with moderate dilatation and mural thickening. 10 cm proximal to the ileocecal valve, there is a segment of ileum which is more focally thickened, with very prominent inflammatory stranding extending into the mesentery and with 1 or 2 bubbles of air either within the small bowel wall or in the mesentery. This has the appearance of a focal contained perforation. This acutely inflamed segment of small bowel measures several cm in length but the remainder of the ileum distal to this over the next 10 cm is abnormal all way to the ileocecal valve. Proximal to this, the small bowel appears normal. There is no bowel obstruction. There is no free intraperitoneal air. There is no ascites. Colon is remarkable only for uncomplicated diverticulosis. Vascular/Lymphatic: There is mild aneurysmal dilatation of the distal abdominal aorta, up to  3.5 cm maximum AP dimension at the bifurcation, with generous mural thrombus. 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 The left common iliac artery is mildly aneurysmal at 1.5 cm. Reproductive: Hysterectomy.  No adnexal abnormalities. Other: Musculoskeletal: No significant musculoskeletal lesion. There is posterior decompression with instrumented fusion from L4 through S1, utilizing pedicle screws and vertical interconnecting hardware, as well as interbody fusion cages. IMPRESSION: 1. Markedly abnormal segment of distal ileum, 10 cm proximal to the ileocecal valve. This has a focal contained perforation and intense inflammation. Leading considerations are inflammatory bowel disease or an ulcerated small bowel neoplasm. 2. Diverticulosis. 3. 3.5 cm distal abdominal aortic aneurysm, continuing to the bifurcation. 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 4. These results will be called to the ordering clinician or representative by the Radiologist Assistant, and communication documented in the PACS or zVision Dashboard. Electronically Signed   By: Andreas Newport M.D.   On: 07/14/2015 00:13    Assessment/Plan Principal Problem:   Perforated viscus Active Problems:   Hypertension    #1. Perforated viscus around the terminal ileum with abdominal ileum - concerning for either inflammatory bowel disease versus ulcerated neoplasm. He had physician has already discussed with on-call general surgeon Dr. Hulen Skains and on-call [facility] physicians for gastroenterology. Patient will be kept nothing by mouth and on IV antibiotics and IV fluids. Pain relief medications and further recommendations per general surgery and gastroenterology. #2. Hypertension - since patient is nothing by mouth I have placed patient on clonidine patch and IV metoprolol scheduled dose. Closely follow blood pressure trends. #3. Hyperthyroidism in remission.  Addendum - I did discuss with Dr. Hulen Skains general surgeon  will be seeing patient in consult.  DVT prophylaxis: SCDs. Code Status: Full code.  Family Communication: No family at the bedside.  Disposition Plan: Home.  Consults called: ER physician and discuss with general surgery and gastroenterology.  Admission status: Inpatient. MedSurg bed. Likely stay 3-4 days.    Rise Patience MD [facility] Pager [phone].  If 7PM-7AM, please contact night-coverage [email or web address] Password [facility]  07/14/2015, 2:44 AM

## 2015-07-14 NOTE — Progress Notes (Signed)
Pharmacy Antibiotic Note  Kathy Herrera is a 55 y.o. female admitted on 07/13/2015 with intra-abdominal infection.  Pharmacy has been consulted for Zosyn dosing.  Plan: Zosyn 3.375g IV q8h (4 hour infusion).  Height: 5\' 7"  (170.2 cm) Weight: 260 lb (117.935 kg) IBW/kg (Calculated) : 61.6  Temp (24hrs), Avg:98.3 F (36.8 C), Min:97.9 F (36.6 C), Max:99 F (37.2 C)   Recent Labs Lab 07/13/15 1621  WBC 10.6*  CREATININE 1.21*    Estimated Creatinine Clearance: 68.9 mL/min (by C-G formula based on Cr of 1.21).    Allergies  Allergen Reactions  . Shrimp [Shellfish Allergy] Hives    May be seasoning not always    Thank you for allowing pharmacy to be a part of this patient's care.  Wynona Neat, PharmD, BCPS  07/14/2015 3:20 AM

## 2015-07-14 NOTE — Progress Notes (Signed)
Pt seen and examined at bedside, admitted after midnight, please see earlier admission note by Dr. Hal Hope. Pt came from home for evaluation of abd pain. Imaging studies notable for perforated viscus around the terminal ileum with abdominal ileum - concerning for either inflammatory bowel disease versus ulcerated neoplasm. Pt started on Cipro and Flagyl in ED, coverage broadened to Zosyn. Will continue Zosyn for now. Appreciate GI and surgery team assistance.   Faye Ramsay, MD  [facility] Pager [phone]  If 7PM-7AM, please contact night-coverage [email or web address] Password [email or web address]

## 2015-07-14 NOTE — Consult Note (Signed)
Reason for Consult:Perforated viscous Referring Physician: [name] is an 55 y.o. female.  HPI: Abdominal pain for several days, relieved, then bad again, now mostly in her right back and flank area.  Minimal abdominal pain  Past Medical History  Diagnosis Date  . DJD (degenerative joint disease) of lumbar spine   . Heart murmur   . Hypertension   . Hyperlipidemia   . GERD (gastroesophageal reflux disease)     not everyday and uses tums when needed    Past Surgical History  Procedure Laterality Date  . Abdominal hysterectomy      partial- still has cervix.  . Cholecystectomy    . Tonsillectomy and adenoidectomy    . Arm surgery Left   . Lumbar fusion N/A 02/02/2014    Procedure: CENTRAL LAMINECTOMY L4-5,L5-S1, TRANSFORAMINAL LUMBAR INTERBODY FUSION L4-5,L5-S1, RODS AND SCREW FIX  AND L5-S1, LOCAL BONE GRAFT;  Surgeon: Jessy Oto, MD;  Location: [place];  Service: Orthopedics;  Laterality: N/A;    Family History  Problem Relation Age of Onset  . Cancer Mother 57    cervical  . CVA Mother   . Hypertension Mother   . Thyroid disease Daughter     Social History:  reports that she has been smoking Cigarettes.  She has a 20 pack-year smoking history. She does not have any smokeless tobacco history on file. She reports that she drinks alcohol. She reports that she does not use illicit drugs.  Allergies:  Allergies  Allergen Reactions  . Shrimp [Shellfish Allergy] Hives    May be seasoning not always    Medications: I have reviewed the patient's current medications.  Results for orders placed or performed during the hospital encounter of 07/13/15 (from the past 48 hour(s))  Comprehensive metabolic panel     Status: Abnormal   Collection Time: 07/13/15  4:21 PM  Result Value Ref Range   Sodium 138 135 - 145 mmol/L   Potassium 3.9 3.5 - 5.1 mmol/L   Chloride 101 101 - 111 mmol/L   CO2 26 22 - 32 mmol/L   Glucose, Bld 135 (H) 65 - 99 mg/dL   BUN 15 6 -  20 mg/dL   Creatinine, Ser 1.21 (H) 0.44 - 1.00 mg/dL   Calcium 10.1 8.9 - 10.3 mg/dL   Total Protein 7.6 6.5 - 8.1 g/dL   Albumin 3.4 (L) 3.5 - 5.0 g/dL   AST 16 15 - 41 U/L   ALT 13 (L) 14 - 54 U/L   Alkaline Phosphatase 93 38 - 126 U/L   Total Bilirubin 0.6 0.3 - 1.2 mg/dL   GFR calc non Af Amer 49 (L) >60 mL/min   GFR calc Af Amer 57 (L) >60 mL/min    Comment: (NOTE) The eGFR has been calculated using the CKD EPI equation. This calculation has not been validated in all clinical situations. eGFR's persistently <60 mL/min signify possible Chronic Kidney Disease.    Anion gap 11 5 - 15  CBC     Status: Abnormal   Collection Time: 07/13/15  4:21 PM  Result Value Ref Range   WBC 10.6 (H) 4.0 - 10.5 K/uL   RBC 5.40 (H) 3.87 - 5.11 MIL/uL   Hemoglobin 15.5 (H) 12.0 - 15.0 g/dL   HCT 47.1 (H) 36.0 - 46.0 %   MCV 87.2 78.0 - 100.0 fL   MCH 28.7 26.0 - 34.0 pg   MCHC 32.9 30.0 - 36.0 g/dL   RDW 13.9 11.5 -  15.5 %   Platelets 243 150 - 400 K/uL  Urinalysis, Routine w reflex microscopic     Status: Abnormal   Collection Time: 07/13/15  7:04 PM  Result Value Ref Range   Color, Urine YELLOW YELLOW   APPearance CLEAR CLEAR   Specific Gravity, Urine 1.024 1.005 - 1.030   pH 6.0 5.0 - 8.0   Glucose, UA NEGATIVE NEGATIVE mg/dL   Hgb urine dipstick NEGATIVE NEGATIVE   Bilirubin Urine SMALL (A) NEGATIVE   Ketones, ur 15 (A) NEGATIVE mg/dL   Protein, ur NEGATIVE NEGATIVE mg/dL   Nitrite NEGATIVE NEGATIVE   Leukocytes, UA NEGATIVE NEGATIVE    Comment: MICROSCOPIC NOT DONE ON URINES WITH NEGATIVE PROTEIN, BLOOD, LEUKOCYTES, NITRITE, OR GLUCOSE <1000 mg/dL.  Troponin I     Status: None   Collection Time: 07/13/15 10:09 PM  Result Value Ref Range   Troponin I <0.03 <0.031 ng/mL    Comment:        NO INDICATION OF MYOCARDIAL INJURY.   Lactic acid, plasma     Status: None   Collection Time: 07/14/15  2:00 AM  Result Value Ref Range   Lactic Acid, Venous 1.5 0.5 - 2.0 mmol/L    Comprehensive metabolic panel     Status: Abnormal   Collection Time: 07/14/15  3:45 AM  Result Value Ref Range   Sodium 140 135 - 145 mmol/L   Potassium 3.5 3.5 - 5.1 mmol/L   Chloride 103 101 - 111 mmol/L   CO2 26 22 - 32 mmol/L   Glucose, Bld 98 65 - 99 mg/dL   BUN 14 6 - 20 mg/dL   Creatinine, Ser 1.05 (H) 0.44 - 1.00 mg/dL   Calcium 9.1 8.9 - 10.3 mg/dL   Total Protein 6.5 6.5 - 8.1 g/dL   Albumin 3.0 (L) 3.5 - 5.0 g/dL   AST 14 (L) 15 - 41 U/L   ALT 12 (L) 14 - 54 U/L   Alkaline Phosphatase 79 38 - 126 U/L   Total Bilirubin 0.6 0.3 - 1.2 mg/dL   GFR calc non Af Amer 58 (L) >60 mL/min   GFR calc Af Amer >60 >60 mL/min    Comment: (NOTE) The eGFR has been calculated using the CKD EPI equation. This calculation has not been validated in all clinical situations. eGFR's persistently <60 mL/min signify possible Chronic Kidney Disease.    Anion gap 11 5 - 15  CBC WITH DIFFERENTIAL     Status: Abnormal   Collection Time: 07/14/15  3:45 AM  Result Value Ref Range   WBC 11.3 (H) 4.0 - 10.5 K/uL   RBC 4.67 3.87 - 5.11 MIL/uL   Hemoglobin 13.1 12.0 - 15.0 g/dL   HCT 40.7 36.0 - 46.0 %   MCV 87.2 78.0 - 100.0 fL   MCH 28.1 26.0 - 34.0 pg   MCHC 32.2 30.0 - 36.0 g/dL   RDW 14.0 11.5 - 15.5 %   Platelets 220 150 - 400 K/uL   Neutrophils Relative % 53 %   Neutro Abs 6.0 1.7 - 7.7 K/uL   Lymphocytes Relative 37 %   Lymphs Abs 4.1 (H) 0.7 - 4.0 K/uL   Monocytes Relative 8 %   Monocytes Absolute 0.9 0.1 - 1.0 K/uL   Eosinophils Relative 2 %   Eosinophils Absolute 0.2 0.0 - 0.7 K/uL   Basophils Relative 0 %   Basophils Absolute 0.0 0.0 - 0.1 K/uL  Lactic acid, plasma     Status: None  Collection Time: 07/14/15  3:54 AM  Result Value Ref Range   Lactic Acid, Venous 1.0 0.5 - 2.0 mmol/L  Glucose, capillary     Status: None   Collection Time: 07/14/15  5:49 AM  Result Value Ref Range   Glucose-Capillary 96 65 - 99 mg/dL    Ct Abdomen Pelvis W Contrast  07/14/2015   CLINICAL DATA:  Right lower quadrant pain for 3 days. EXAM: CT ABDOMEN AND PELVIS WITH CONTRAST TECHNIQUE: Multidetector CT imaging of the abdomen and pelvis was performed using the standard protocol following bolus administration of intravenous contrast. CONTRAST:  174m ISOVUE-300 IOPAMIDOL (ISOVUE-300) INJECTION 61% COMPARISON:  None. FINDINGS: Lower chest:  No significant abnormality Hepatobiliary: Cholecystectomy. There are normal appearances of the liver and bile ducts. Pancreas: Normal Spleen: Normal Adrenals/Urinary Tract: The adrenals and kidneys are normal in appearance. There is no urinary calculus evident. There is no hydronephrosis or ureteral dilatation. Collecting systems and ureters appear unremarkable. Stomach/Bowel: The stomach is normal. Proximal small bowel is normal. The terminal ileum is abnormal, with moderate dilatation and mural thickening. 10 cm proximal to the ileocecal valve, there is a segment of ileum which is more focally thickened, with very prominent inflammatory stranding extending into the mesentery and with 1 or 2 bubbles of air either within the small bowel wall or in the mesentery. This has the appearance of a focal contained perforation. This acutely inflamed segment of small bowel measures several cm in length but the remainder of the ileum distal to this over the next 10 cm is abnormal all way to the ileocecal valve. Proximal to this, the small bowel appears normal. There is no bowel obstruction. There is no free intraperitoneal air. There is no ascites. Colon is remarkable only for uncomplicated diverticulosis. Vascular/Lymphatic: There is mild aneurysmal dilatation of the distal abdominal aorta, up to 3.5 cm maximum AP dimension at the bifurcation, with generous mural thrombus. 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 The left common iliac artery is mildly aneurysmal at 1.5 cm. Reproductive: Hysterectomy.  No adnexal abnormalities. Other: Musculoskeletal: No  significant musculoskeletal lesion. There is posterior decompression with instrumented fusion from L4 through S1, utilizing pedicle screws and vertical interconnecting hardware, as well as interbody fusion cages. IMPRESSION: 1. Markedly abnormal segment of distal ileum, 10 cm proximal to the ileocecal valve. This has a focal contained perforation and intense inflammation. Leading considerations are inflammatory bowel disease or an ulcerated small bowel neoplasm. 2. Diverticulosis. 3. 3.5 cm distal abdominal aortic aneurysm, continuing to the bifurcation. 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 4. These results will be called to the ordering clinician or representative by the Radiologist Assistant, and communication documented in the PACS or zVision Dashboard. Electronically Signed   By: DAndreas NewportM.D.   On: 07/14/2015 00:13    Review of Systems  Constitutional: Negative for fever and chills.  HENT: Negative.   Eyes: Negative.   Respiratory: Positive for cough.   Cardiovascular: Negative.   Gastrointestinal: Positive for nausea, vomiting and abdominal pain.  Genitourinary: Negative.   Musculoskeletal: Positive for back pain.  Skin: Negative.   Neurological: Negative.   Endo/Heme/Allergies: Negative.   Psychiatric/Behavioral: Negative.    Blood pressure 110/75, pulse 75, temperature 98.1 F (36.7 C), temperature source Oral, resp. rate 20, height _0  (1.702 m), weight 117.935 kg (260 lb), SpO2 97 %. Physical Exam  Vitals reviewed. Constitutional: She is oriented to person, place, and time.  Morbidly obese  HENT:  Head:  Normocephalic and atraumatic.  Eyes: Conjunctivae and EOM are normal. Pupils are equal, round, and reactive to light.  Neck: Normal range of motion. Neck supple.  Cardiovascular: Normal rate, regular rhythm and normal heart sounds.   Respiratory: Effort normal and breath sounds normal.  GI: Soft. Normal appearance and bowel sounds are normal.  There is tenderness in the right lower quadrant. There is no rigidity, no rebound and no guarding.    Musculoskeletal: Normal range of motion.  Neurological: She is alert and oriented to person, place, and time.  Skin: Skin is warm and dry.  Psychiatric: She has a normal mood and affect. Her behavior is normal. Judgment and thought content normal.    Assessment/Plan: Kathy Herrera obese female patient with possible perforated distal ileum eitheer from a Meckel's or IBD, but without peritonitis.  This structure seen on CT is in the central abdomen and does not seem to correlate with the area of discomfort that she identifies.  Further workup and possible GI consultaion may be helpful, and she may just need IV antibiotics.  We will follow.  Kathy Herrera 07/14/2015, 6:52 AM

## 2015-07-14 NOTE — ED Notes (Signed)
Attempted report x1. 
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
## 2015-07-14 NOTE — Care Management Note (Signed)
Case Management Note  Patient Details  Name: JERELEAN GLOSSON MRN: [id number] Date of Birth: Dec 28, 1960  Subjective/Objective:                    Action/Plan:   Expected Discharge Date:                  Expected Discharge Plan:  Home/Self Care  In-House Referral:     Discharge planning Services     Post Acute Care Choice:    Choice offered to:     DME Arranged:    DME Agency:     HH Arranged:    [place] Agency:     Status of Service:  In process, will continue to follow  Medicare Important Message Given:    Date Medicare IM Given:    Medicare IM give by:    Date Additional Medicare IM Given:    Additional Medicare Important Message give by:     If discussed at [place] of Stay Meetings, dates discussed:    Additional Comments:  Marilu Favre, RN 07/14/2015, 2:19 PM

## 2015-07-15 DIAGNOSIS — I10 Essential (primary) hypertension: Secondary | ICD-10-CM

## 2015-07-15 LAB — CBC
HCT: 41.4 % (ref 36.0–46.0)
Hemoglobin: 13.1 g/dL (ref 12.0–15.0)
MCH: 27.8 pg (ref 26.0–34.0)
MCHC: 31.6 g/dL (ref 30.0–36.0)
MCV: 87.9 fL (ref 78.0–100.0)
Platelets: 199 10*3/uL (ref 150–400)
RBC: 4.71 MIL/uL (ref 3.87–5.11)
RDW: 14 % (ref 11.5–15.5)
WBC: 8.2 10*3/uL (ref 4.0–10.5)

## 2015-07-15 LAB — GLUCOSE, CAPILLARY
Glucose-Capillary: 102 mg/dL — ABNORMAL HIGH (ref 65–99)
Glucose-Capillary: 118 mg/dL — ABNORMAL HIGH (ref 65–99)
Glucose-Capillary: 78 mg/dL (ref 65–99)
Glucose-Capillary: 98 mg/dL (ref 65–99)

## 2015-07-15 LAB — BASIC METABOLIC PANEL
Anion gap: 13 (ref 5–15)
BUN: 12 mg/dL (ref 6–20)
CO2: 24 mmol/L (ref 22–32)
Calcium: 9.3 mg/dL (ref 8.9–10.3)
Chloride: 105 mmol/L (ref 101–111)
Creatinine, Ser: 1.16 mg/dL — ABNORMAL HIGH (ref 0.44–1.00)
GFR calc Af Amer: 60 mL/min — ABNORMAL LOW (ref >60)
GFR calc non Af Amer: 52 mL/min — ABNORMAL LOW (ref >60)
Glucose, Bld: 95 mg/dL (ref 65–99)
Potassium: 3.5 mmol/L (ref 3.5–5.1)
Sodium: 142 mmol/L (ref 135–145)

## 2015-07-15 NOTE — Progress Notes (Signed)
Patient ID: Kathy Herrera, female   DOB: 10/25/1960, 55 y.o.   MRN: [id number]     [facility]      [address] [place], [place] [address]    Phone: [phone] FAX: [phone]     Subjective: Afebrile.  VSS.  Feels bloated. Passing flatus.  C/o back pain, suppose it can be referred pain.   Objective:  Vital signs:  Filed Vitals:   07/14/15 0311 07/14/15 0521 07/14/15 1326 07/14/15 2134  BP: 105/86 110/75 112/70 140/66  Pulse: 76 75 63 70  Temp: 97.9 F (36.6 C) 98.1 F (36.7 C) 98.2 F (36.8 C) 98.1 F (36.7 C)  TempSrc: Oral Oral Oral Oral  Resp: '20 20 18 19  '$ Height:      Weight:      SpO2: 96% 97% 96% 96%    Last BM Date: 07/12/15  Intake/Output   Yesterday:  05/11 0701 - 05/12 0700 In: 1896.8 [I.V.:1810.8; IV Piggyback:86] Out: 1000 [Urine:1000] This shift: I/O last 3 completed shifts: In: 2440.8 [I.V.:2354.8; IV Piggyback:86] Out: 1000 [Urine:1000]     Physical Exam: General: Pt awake/alert/oriented x4 in no acute distress  Abdomen: Soft.  Nondistended.  Non tender.  No evidence of peritonitis.  No incarcerated hernias.    Problem List:   Principal Problem:   Perforated viscus Active Problems:   Hypertension    Results:   Labs: Results for orders placed or performed during the hospital encounter of 07/13/15 (from the past 48 hour(s))  Comprehensive metabolic panel     Status: Abnormal   Collection Time: 07/13/15  4:21 PM  Result Value Ref Range   Sodium 138 135 - 145 mmol/L   Potassium 3.9 3.5 - 5.1 mmol/L   Chloride 101 101 - 111 mmol/L   CO2 26 22 - 32 mmol/L   Glucose, Bld 135 (H) 65 - 99 mg/dL   BUN 15 6 - 20 mg/dL   Creatinine, Ser 1.21 (H) 0.44 - 1.00 mg/dL   Calcium 10.1 8.9 - 10.3 mg/dL   Total Protein 7.6 6.5 - 8.1 g/dL   Albumin 3.4 (L) 3.5 - 5.0 g/dL   AST 16 15 - 41 U/L   ALT 13 (L) 14 - 54 U/L   Alkaline Phosphatase 93 38 - 126 U/L   Total Bilirubin 0.6 0.3 - 1.2  mg/dL   GFR calc non Af Amer 49 (L) >60 mL/min   GFR calc Af Amer 57 (L) >60 mL/min    Comment: (NOTE) The eGFR has been calculated using the CKD EPI equation. This calculation has not been validated in all clinical situations. eGFR's persistently <60 mL/min signify possible Chronic Kidney Disease.    Anion gap 11 5 - 15  CBC     Status: Abnormal   Collection Time: 07/13/15  4:21 PM  Result Value Ref Range   WBC 10.6 (H) 4.0 - 10.5 K/uL   RBC 5.40 (H) 3.87 - 5.11 MIL/uL   Hemoglobin 15.5 (H) 12.0 - 15.0 g/dL   HCT 47.1 (H) 36.0 - 46.0 %   MCV 87.2 78.0 - 100.0 fL   MCH 28.7 26.0 - 34.0 pg   MCHC 32.9 30.0 - 36.0 g/dL   RDW 13.9 11.5 - 15.5 %   Platelets 243 150 - 400 K/uL  Urinalysis, Routine w reflex microscopic     Status: Abnormal   Collection Time: 07/13/15  7:04 PM  Result Value Ref Range   Color, Urine YELLOW YELLOW  APPearance CLEAR CLEAR   Specific Gravity, Urine 1.024 1.005 - 1.030   pH 6.0 5.0 - 8.0   Glucose, UA NEGATIVE NEGATIVE mg/dL   Hgb urine dipstick NEGATIVE NEGATIVE   Bilirubin Urine SMALL (A) NEGATIVE   Ketones, ur 15 (A) NEGATIVE mg/dL   Protein, ur NEGATIVE NEGATIVE mg/dL   Nitrite NEGATIVE NEGATIVE   Leukocytes, UA NEGATIVE NEGATIVE    Comment: MICROSCOPIC NOT DONE ON URINES WITH NEGATIVE PROTEIN, BLOOD, LEUKOCYTES, NITRITE, OR GLUCOSE <1000 mg/dL.  Troponin I     Status: None   Collection Time: 07/13/15 10:09 PM  Result Value Ref Range   Troponin I <0.03 <0.031 ng/mL    Comment:        NO INDICATION OF MYOCARDIAL INJURY.   Lactic acid, plasma     Status: None   Collection Time: 07/14/15  2:00 AM  Result Value Ref Range   Lactic Acid, Venous 1.5 0.5 - 2.0 mmol/L  Comprehensive metabolic panel     Status: Abnormal   Collection Time: 07/14/15  3:45 AM  Result Value Ref Range   Sodium 140 135 - 145 mmol/L   Potassium 3.5 3.5 - 5.1 mmol/L   Chloride 103 101 - 111 mmol/L   CO2 26 22 - 32 mmol/L   Glucose, Bld 98 65 - 99 mg/dL   BUN 14 6 - 20  mg/dL   Creatinine, Ser 1.05 (H) 0.44 - 1.00 mg/dL   Calcium 9.1 8.9 - 10.3 mg/dL   Total Protein 6.5 6.5 - 8.1 g/dL   Albumin 3.0 (L) 3.5 - 5.0 g/dL   AST 14 (L) 15 - 41 U/L   ALT 12 (L) 14 - 54 U/L   Alkaline Phosphatase 79 38 - 126 U/L   Total Bilirubin 0.6 0.3 - 1.2 mg/dL   GFR calc non Af Amer 58 (L) >60 mL/min   GFR calc Af Amer >60 >60 mL/min    Comment: (NOTE) The eGFR has been calculated using the CKD EPI equation. This calculation has not been validated in all clinical situations. eGFR's persistently <60 mL/min signify possible Chronic Kidney Disease.    Anion gap 11 5 - 15  CBC WITH DIFFERENTIAL     Status: Abnormal   Collection Time: 07/14/15  3:45 AM  Result Value Ref Range   WBC 11.3 (H) 4.0 - 10.5 K/uL   RBC 4.67 3.87 - 5.11 MIL/uL   Hemoglobin 13.1 12.0 - 15.0 g/dL   HCT 40.7 36.0 - 46.0 %   MCV 87.2 78.0 - 100.0 fL   MCH 28.1 26.0 - 34.0 pg   MCHC 32.2 30.0 - 36.0 g/dL   RDW 14.0 11.5 - 15.5 %   Platelets 220 150 - 400 K/uL   Neutrophils Relative % 53 %   Neutro Abs 6.0 1.7 - 7.7 K/uL   Lymphocytes Relative 37 %   Lymphs Abs 4.1 (H) 0.7 - 4.0 K/uL   Monocytes Relative 8 %   Monocytes Absolute 0.9 0.1 - 1.0 K/uL   Eosinophils Relative 2 %   Eosinophils Absolute 0.2 0.0 - 0.7 K/uL   Basophils Relative 0 %   Basophils Absolute 0.0 0.0 - 0.1 K/uL  Lactic acid, plasma     Status: None   Collection Time: 07/14/15  3:54 AM  Result Value Ref Range   Lactic Acid, Venous 1.0 0.5 - 2.0 mmol/L  Glucose, capillary     Status: None   Collection Time: 07/14/15  5:49 AM  Result Value Ref Range  Glucose-Capillary 96 65 - 99 mg/dL  Glucose, capillary     Status: Abnormal   Collection Time: 07/14/15 11:54 AM  Result Value Ref Range   Glucose-Capillary 105 (H) 65 - 99 mg/dL  Glucose, capillary     Status: Abnormal   Collection Time: 07/14/15  4:44 PM  Result Value Ref Range   Glucose-Capillary 110 (H) 65 - 99 mg/dL  Glucose, capillary     Status: Abnormal    Collection Time: 07/15/15 12:10 AM  Result Value Ref Range   Glucose-Capillary 102 (H) 65 - 99 mg/dL  Glucose, capillary     Status: None   Collection Time: 07/15/15  7:18 AM  Result Value Ref Range   Glucose-Capillary 98 65 - 99 mg/dL    Imaging / Studies: Ct Abdomen Pelvis W Contrast  07/14/2015  CLINICAL DATA:  Right lower quadrant pain for 3 days. EXAM: CT ABDOMEN AND PELVIS WITH CONTRAST TECHNIQUE: Multidetector CT imaging of the abdomen and pelvis was performed using the standard protocol following bolus administration of intravenous contrast. CONTRAST:  146m ISOVUE-300 IOPAMIDOL (ISOVUE-300) INJECTION 61% COMPARISON:  None. FINDINGS: Lower chest:  No significant abnormality Hepatobiliary: Cholecystectomy. There are normal appearances of the liver and bile ducts. Pancreas: Normal Spleen: Normal Adrenals/Urinary Tract: The adrenals and kidneys are normal in appearance. There is no urinary calculus evident. There is no hydronephrosis or ureteral dilatation. Collecting systems and ureters appear unremarkable. Stomach/Bowel: The stomach is normal. Proximal small bowel is normal. The terminal ileum is abnormal, with moderate dilatation and mural thickening. 10 cm proximal to the ileocecal valve, there is a segment of ileum which is more focally thickened, with very prominent inflammatory stranding extending into the mesentery and with 1 or 2 bubbles of air either within the small bowel wall or in the mesentery. This has the appearance of a focal contained perforation. This acutely inflamed segment of small bowel measures several cm in length but the remainder of the ileum distal to this over the next 10 cm is abnormal all way to the ileocecal valve. Proximal to this, the small bowel appears normal. There is no bowel obstruction. There is no free intraperitoneal air. There is no ascites. Colon is remarkable only for uncomplicated diverticulosis. Vascular/Lymphatic: There is mild aneurysmal dilatation of  the distal abdominal aorta, up to 3.5 cm maximum AP dimension at the bifurcation, with generous mural thrombus. 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 The left common iliac artery is mildly aneurysmal at 1.5 cm. Reproductive: Hysterectomy.  No adnexal abnormalities. Other: Musculoskeletal: No significant musculoskeletal lesion. There is posterior decompression with instrumented fusion from L4 through S1, utilizing pedicle screws and vertical interconnecting hardware, as well as interbody fusion cages. IMPRESSION: 1. Markedly abnormal segment of distal ileum, 10 cm proximal to the ileocecal valve. This has a focal contained perforation and intense inflammation. Leading considerations are inflammatory bowel disease or an ulcerated small bowel neoplasm. 2. Diverticulosis. 3. 3.5 cm distal abdominal aortic aneurysm, continuing to the bifurcation. 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 4. These results will be called to the ordering clinician or representative by the Radiologist Assistant, and communication documented in the PACS or zVision Dashboard. Electronically Signed   By: DAndreas NewportM.D.   On: 07/14/2015 00:13    Medications / Allergies:  Scheduled Meds: . antiseptic oral rinse  7 mL Mouth Rinse q12n4p  . chlorhexidine  15 mL Mouth Rinse BID  . cloNIDine  0.1 mg Transdermal Weekly  . metoprolol  2.5 mg Intravenous Q6H  . piperacillin-tazobactam (ZOSYN)  IV  3.375 g Intravenous Q8H   Continuous Infusions:  PRN Meds:.acetaminophen **OR** acetaminophen, morphine injection, ondansetron **OR** ondansetron (ZOFRAN) IV  Antibiotics: Anti-infectives    Start     Dose/Rate Route Frequency Ordered Stop   07/14/15 1000  piperacillin-tazobactam (ZOSYN) IVPB 3.375 g     3.375 g 12.5 mL/hr over 240 Minutes Intravenous Every 8 hours 07/14/15 0322     07/14/15 0330  piperacillin-tazobactam (ZOSYN) IVPB 3.375 g     3.375 g 100 mL/hr over 30 Minutes Intravenous  Once  07/14/15 0322 07/14/15 0432   07/14/15 0130  ciprofloxacin (CIPRO) IVPB 400 mg     400 mg 200 mL/hr over 60 Minutes Intravenous  Once 07/14/15 0117 07/14/15 0339   07/14/15 0130  metroNIDAZOLE (FLAGYL) IVPB 500 mg     500 mg 100 mL/hr over 60 Minutes Intravenous  Once 07/14/15 0117 07/14/15 4081        Assessment/Plan Possible perforated distal ileum-remains non tender and afebrile.  Only complaint is back pain which.  On IV antibiotics.  Clears are okay from a surgical standpoint.  There are no indications for surgical intervention.  Etiology is unclear.  GI is following, recommended CT enterography.  We will continue to follow along.    Erby Pian, [facility] Surgery Pager [phone]) For consults and floor pages call [phone](7A-4:30P)  07/15/2015 8:19 AM

## 2015-07-15 NOTE — Progress Notes (Signed)
Patient ID: Kathy Herrera [facility], female   DOB: 08/20/1960, 55 y.o.   MRN: [id number]    PROGRESS NOTE    Karel Goulet [facility]  [id number] DOB: [birth year] DOA: 07/13/2015  PCP: Arnette Norris, MD   Brief Narrative:  55 year old female presented to [facility] for evaluation of right-sided flank pain for several days. CT scan notable for a markedly abnormal segment of distal ileum 10 cm proximal to the ileocecal valve this had a focal contained perforation and intense inflammation around this area. Pt started on Cipro and Flagyl in ED, coverage broadened to Zosyn. Surgery and GI team consulted.   Assessment & Plan:   Principal Problem:   ? Perforated viscus, distal ileum - no indications for surgical intervention - etiology remains unclear  - appreciate surgery and GI team following - continue Zosyn day #2 - continue analgesia as needed  - plan for CT enterography  Active Problems:   Hypertension, essential - reasonable inpatient control     Acute kidney injury - secondary to the above, pre renal etiology - monitor     Morbid obesity - Body mass index is 40.71 kg/(m^2)  DVT prophylaxis: SCD's Code Status: Full  Family Communication: Patient at bedside  Disposition Plan: Home once consultants clear for d/c  Consultants:   Surgery  GI  Procedures:   None  Antimicrobials:   Zosyn 5/11 -->  Subjective: Reports feeling better but still tender in the right flank area.   Objective: Filed Vitals:   07/14/15 1326 07/14/15 2134 07/15/15 1328 07/15/15 1332  BP: 112/70 140/66 130/74   Pulse: 63 70 55   Temp: 98.2 F (36.8 C) 98.1 F (36.7 C)  97.9 F (36.6 C)  TempSrc: Oral Oral  Oral  Resp: 18 19  18   Height:      Weight:      SpO2: 96% 96%  99%    Intake/Output Summary (Last 24 hours) at 07/15/15 1844 Last data filed at 07/15/15 1658  Gross per 24 hour  Intake 1873.75 ml  Output   1100 ml  Net 773.75 ml   Filed Weights   07/13/15 1556  Weight: 117.935 kg (260 lb)     Examination:  General exam: Appears calm and comfortable  Respiratory system: Clear to auscultation. Respiratory effort normal. Cardiovascular system: S1 & S2 heard, RRR. No JVD, rubs, gallops or clicks. No pedal edema. Gastrointestinal system: Abdomen is nondistended, soft and nontender. No organomegaly or masses felt.  Central nervous system: Alert and oriented. No focal neurological deficits. Extremities: Symmetric 5 x 5 power.  Data Reviewed: I have personally reviewed following labs and imaging studies  CBC:  Recent Labs Lab 07/13/15 1621 07/14/15 0345 07/15/15 0736  WBC 10.6* 11.3* 8.2  NEUTROABS  --  6.0  --   HGB 15.5* 13.1 13.1  HCT 47.1* 40.7 41.4  MCV 87.2 87.2 87.9  PLT 243 220 [id number]   Basic Metabolic Panel:  Recent Labs Lab 07/13/15 1621 07/14/15 0345 07/15/15 0736  NA 138 140 142  K 3.9 3.5 3.5  CL 101 103 105  CO2 26 26 24   GLUCOSE 135* 98 95  BUN 15 14 12   CREATININE 1.21* 1.05* 1.16*  CALCIUM 10.1 9.1 9.3   Liver Function Tests:  Recent Labs Lab 07/13/15 1621 07/14/15 0345  AST 16 14*  ALT 13* 12*  ALKPHOS 93 79  BILITOT 0.6 0.6  PROT 7.6 6.5  ALBUMIN 3.4* 3.0*   Cardiac Enzymes:  Recent Labs Lab 07/13/15 2209  TROPONINI <  0.03   CBG:  Recent Labs Lab 07/14/15 1644 07/15/15 0010 07/15/15 0718 07/15/15 1148 07/15/15 1729  GLUCAP 110* 102* 98 78 118*   Urine analysis:    Component Value Date/Time   COLORURINE YELLOW 07/13/2015 1904   APPEARANCEUR CLEAR 07/13/2015 1904   LABSPEC 1.024 07/13/2015 1904   PHURINE 6.0 07/13/2015 1904   GLUCOSEU NEGATIVE 07/13/2015 1904   HGBUR NEGATIVE 07/13/2015 1904   BILIRUBINUR SMALL* 07/13/2015 1904   KETONESUR 15* 07/13/2015 1904   PROTEINUR NEGATIVE 07/13/2015 1904   UROBILINOGEN 0.2 01/21/2014 0854   NITRITE NEGATIVE 07/13/2015 1904   LEUKOCYTESUR NEGATIVE 07/13/2015 1904   Recent Results (from the past 240 hour(s))  Blood culture (routine x 2)     Status: None (Preliminary  result)   Collection Time: 07/14/15  2:20 AM  Result Value Ref Range Status   Specimen Description BLOOD RIGHT ARM  Final   Special Requests AEROBIC BOTTLE ONLY 10ML  Final   Culture NO GROWTH 1 DAY  Final   Report Status PENDING  Incomplete  Blood culture (routine x 2)     Status: None (Preliminary result)   Collection Time: 07/14/15  2:25 AM  Result Value Ref Range Status   Specimen Description BLOOD RIGHT HAND  Final   Special Requests IN PEDIATRIC BOTTLE 3ML  Final   Culture NO GROWTH 1 DAY  Final   Report Status PENDING  Incomplete      Radiology Studies: Ct Abdomen Pelvis W Contrast 07/14/2015  Markedly abnormal segment of distal ileum, 10 cm proximal to the ileocecal valve. This has a focal contained perforation and intense inflammation. Leading considerations are inflammatory bowel disease or an ulcerated small bowel neoplasm. 2. Diverticulosis. 3. 3.5 cm distal abdominal aortic aneurysm, continuing to the bifurcation. 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   Scheduled Meds: . antiseptic oral rinse  7 mL Mouth Rinse q12n4p  . chlorhexidine  15 mL Mouth Rinse BID  . cloNIDine  0.1 mg Transdermal Weekly  . metoprolol  2.5 mg Intravenous Q6H  . piperacillin-tazobactam (ZOSYN)  IV  3.375 g Intravenous Q8H   Continuous Infusions:    LOS: 1 day   Time spent: 20 minutes   Faye Ramsay, MD [facility] Pager [phone]  If 7PM-7AM, please contact night-coverage [email or web address] Password [facility] 07/15/2015, 6:44 PM

## 2015-07-15 NOTE — Progress Notes (Signed)
[facility] Progress Note  Subjective: No complaints of abdominal pain. She does have some pain over the right flank and hip area. She is passing flatus.  Objective: Vital signs in last 24 hours: Temp:  [98.1 F (36.7 C)-98.2 F (36.8 C)] 98.1 F (36.7 C) (05/11 2134) Pulse Rate:  [63-70] 70 (05/11 2134) Resp:  [18-19] 19 (05/11 2134) BP: (112-140)/(66-70) 140/66 mmHg (05/11 2134) SpO2:  [96 %] 96 % (05/11 2134) Weight change:    PE:  No distress  Heart regular rhythm  Lungs clear  Abdomen soft and nontender  Lab Results: Results for orders placed or performed during the hospital encounter of 07/13/15 (from the past 24 hour(s))  Glucose, capillary     Status: Abnormal   Collection Time: 07/14/15 11:54 AM  Result Value Ref Range   Glucose-Capillary 105 (H) 65 - 99 mg/dL  Glucose, capillary     Status: Abnormal   Collection Time: 07/14/15  4:44 PM  Result Value Ref Range   Glucose-Capillary 110 (H) 65 - 99 mg/dL  Glucose, capillary     Status: Abnormal   Collection Time: 07/15/15 12:10 AM  Result Value Ref Range   Glucose-Capillary 102 (H) 65 - 99 mg/dL  Glucose, capillary     Status: None   Collection Time: 07/15/15  7:18 AM  Result Value Ref Range   Glucose-Capillary 98 65 - 99 mg/dL  CBC     Status: None   Collection Time: 07/15/15  7:36 AM  Result Value Ref Range   WBC 8.2 4.0 - 10.5 K/uL   RBC 4.71 3.87 - 5.11 MIL/uL   Hemoglobin 13.1 12.0 - 15.0 g/dL   HCT 41.4 36.0 - 46.0 %   MCV 87.9 78.0 - 100.0 fL   MCH 27.8 26.0 - 34.0 pg   MCHC 31.6 30.0 - 36.0 g/dL   RDW 14.0 11.5 - 15.5 %   Platelets 199 150 - 400 K/uL  Basic metabolic panel     Status: Abnormal   Collection Time: 07/15/15  7:36 AM  Result Value Ref Range   Sodium 142 135 - 145 mmol/L   Potassium 3.5 3.5 - 5.1 mmol/L   Chloride 105 101 - 111 mmol/L   CO2 24 22 - 32 mmol/L   Glucose, Bld 95 65 - 99 mg/dL   BUN 12 6 - 20 mg/dL   Creatinine, Ser 1.16 (H) 0.44 - 1.00 mg/dL   Calcium 9.3 8.9 - 10.3 mg/dL   GFR calc non Af Amer 52 (L) >60 mL/min   GFR calc Af Amer 60 (L) >60 mL/min   Anion gap 13 5 - 15    Studies/Results: No results found.    Assessment: Contained ileal perforation  Plan:   Continue antibiotic therapy for several days. Follow clinically. After a few more days of antibiotic therapy with hopeful improvement of the inflammatory response I would recommend doing a CT enterography.    Cassell Clement 07/15/2015, 11:33 AM  Pager: [phone] If no answer or after 5 PM call [phone]

## 2015-07-16 LAB — CBC
HCT: 40.7 % (ref 36.0–46.0)
Hemoglobin: 13.3 g/dL (ref 12.0–15.0)
MCH: 28.9 pg (ref 26.0–34.0)
MCHC: 32.7 g/dL (ref 30.0–36.0)
MCV: 88.5 fL (ref 78.0–100.0)
Platelets: 224 10*3/uL (ref 150–400)
RBC: 4.6 MIL/uL (ref 3.87–5.11)
RDW: 14 % (ref 11.5–15.5)
WBC: 9.4 10*3/uL (ref 4.0–10.5)

## 2015-07-16 LAB — GLUCOSE, CAPILLARY
Glucose-Capillary: 151 mg/dL — ABNORMAL HIGH (ref 65–99)
Glucose-Capillary: 83 mg/dL (ref 65–99)
Glucose-Capillary: 93 mg/dL (ref 65–99)
Glucose-Capillary: 98 mg/dL (ref 65–99)
Glucose-Capillary: 98 mg/dL (ref 65–99)

## 2015-07-16 LAB — BASIC METABOLIC PANEL
Anion gap: 11 (ref 5–15)
BUN: 8 mg/dL (ref 6–20)
CO2: 26 mmol/L (ref 22–32)
Calcium: 9.2 mg/dL (ref 8.9–10.3)
Chloride: 105 mmol/L (ref 101–111)
Creatinine, Ser: 1.18 mg/dL — ABNORMAL HIGH (ref 0.44–1.00)
GFR calc Af Amer: 59 mL/min — ABNORMAL LOW (ref >60)
GFR calc non Af Amer: 51 mL/min — ABNORMAL LOW (ref >60)
Glucose, Bld: 85 mg/dL (ref 65–99)
Potassium: 3.3 mmol/L — ABNORMAL LOW (ref 3.5–5.1)
Sodium: 142 mmol/L (ref 135–145)

## 2015-07-16 MED ORDER — POTASSIUM CHLORIDE CRYS ER 20 MEQ PO TBCR
40.0000 meq | EXTENDED_RELEASE_TABLET | Freq: Once | ORAL | Status: AC
  Administered 2015-07-16: 40 meq via ORAL
  Filled 2015-07-16: qty 2

## 2015-07-16 MED ORDER — SODIUM CHLORIDE 0.9 % IV SOLN
INTRAVENOUS | Status: DC
  Administered 2015-07-16: 10 mL/h via INTRAVENOUS
  Administered 2015-07-20: 05:00:00 via INTRAVENOUS

## 2015-07-16 NOTE — Progress Notes (Signed)
Patient ID: Kathy Herrera [facility], female   DOB: [birth year], 55 y.o.   MRN: [id number]    PROGRESS NOTE    Takiyah Drzewiecki [facility]  [id number] DOB: Jan 23, 1961 DOA: 07/13/2015  PCP: Arnette Norris, MD   Brief Narrative:  55 year old female presented to [facility] for evaluation of right-sided flank pain for several days. CT scan notable for a markedly abnormal segment of distal ileum 10 cm proximal to the ileocecal valve this had a focal contained perforation and intense inflammation around this area. Pt started on Cipro and Flagyl in ED, coverage broadened to Zosyn. Surgery and GI team consulted.   Assessment & Plan:   Principal Problem:   ? Perforated viscus, distal ileum - no indications for surgical intervention - etiology remains unclear, ? Inflammatory bowel disease, perforated neoplasm (thought to be less likely)  - appreciate surgery and GI team following - continue Zosyn day #2 - keep on clear liquids  - GI plans CT enterography and further workup, which is appropriate.  Active Problems:   Hypertension, essential - reasonable inpatient control     Acute kidney injury - secondary to the above, pre renal etiology - Cr overall stable - BMP in AM    Hypokalemia  - supplement  - BMP in AM    Morbid obesity - Body mass index is 40.71 kg/(m^2)  DVT prophylaxis: SCD's Code Status: Full  Family Communication: Patient at bedside  Disposition Plan: Home once consultants clear for d/c  Consultants:   Surgery  GI  Procedures:   None  Antimicrobials:   Zosyn 5/11 -->  Subjective: Reports feeling better but still tender in the right flank area.   Objective: Filed Vitals:   07/15/15 1904 07/15/15 2029 07/16/15 0000 07/16/15 0616  BP: 156/77 124/63 139/69 141/51  Pulse: 66 63 62 60  Temp:  98.4 F (36.9 C)  98 F (36.7 C)  TempSrc:  Oral  Oral  Resp:  18  19  Height:      Weight:      SpO2:  100%  100%    Intake/Output Summary (Last 24 hours) at 07/16/15 1052 Last data filed  at 07/16/15 0914  Gross per 24 hour  Intake   1620 ml  Output   1650 ml  Net    -30 ml   Filed Weights   07/13/15 1556  Weight: 117.935 kg (260 lb)    Examination:  General exam: Appears calm and comfortable  Respiratory system: Clear to auscultation. Respiratory effort normal. Cardiovascular system: S1 & S2 heard, RRR. No JVD, rubs, gallops or clicks. No pedal edema. Gastrointestinal system: Abdomen is nondistended, soft and nontender. No organomegaly or masses felt.  Central nervous system: Alert and oriented. No focal neurological deficits. Extremities: Symmetric 5 x 5 power.  Data Reviewed: I have personally reviewed following labs and imaging studies  CBC:  Recent Labs Lab 07/13/15 1621 07/14/15 0345 07/15/15 0736 07/16/15 0442  WBC 10.6* 11.3* 8.2 9.4  NEUTROABS  --  6.0  --   --   HGB 15.5* 13.1 13.1 13.3  HCT 47.1* 40.7 41.4 40.7  MCV 87.2 87.2 87.9 88.5  PLT 243 220 199 [id number]   Basic Metabolic Panel:  Recent Labs Lab 07/13/15 1621 07/14/15 0345 07/15/15 0736 07/16/15 0442  NA 138 140 142 142  K 3.9 3.5 3.5 3.3*  CL 101 103 105 105  CO2 26 26 24 26   GLUCOSE 135* 98 95 85  BUN 15 14 12 8   CREATININE 1.21* 1.05* 1.16*  1.18*  CALCIUM 10.1 9.1 9.3 9.2   Liver Function Tests:  Recent Labs Lab 07/13/15 1621 07/14/15 0345  AST 16 14*  ALT 13* 12*  ALKPHOS 93 79  BILITOT 0.6 0.6  PROT 7.6 6.5  ALBUMIN 3.4* 3.0*   Cardiac Enzymes:  Recent Labs Lab 07/13/15 2209  TROPONINI <0.03   CBG:  Recent Labs Lab 07/15/15 0718 07/15/15 1148 07/15/15 1729 07/16/15 0005 07/16/15 0611  GLUCAP 98 78 118* 93 98   Urine analysis:    Component Value Date/Time   COLORURINE YELLOW 07/13/2015 1904   APPEARANCEUR CLEAR 07/13/2015 1904   LABSPEC 1.024 07/13/2015 1904   PHURINE 6.0 07/13/2015 1904   GLUCOSEU NEGATIVE 07/13/2015 1904   HGBUR NEGATIVE 07/13/2015 1904   BILIRUBINUR SMALL* 07/13/2015 1904   KETONESUR 15* 07/13/2015 1904   PROTEINUR  NEGATIVE 07/13/2015 1904   UROBILINOGEN 0.2 01/21/2014 0854   NITRITE NEGATIVE 07/13/2015 1904   LEUKOCYTESUR NEGATIVE 07/13/2015 1904   Recent Results (from the past 240 hour(s))  Blood culture (routine x 2)     Status: None (Preliminary result)   Collection Time: 07/14/15  2:20 AM  Result Value Ref Range Status   Specimen Description BLOOD RIGHT ARM  Final   Special Requests AEROBIC BOTTLE ONLY 10ML  Final   Culture NO GROWTH 1 DAY  Final   Report Status PENDING  Incomplete  Blood culture (routine x 2)     Status: None (Preliminary result)   Collection Time: 07/14/15  2:25 AM  Result Value Ref Range Status   Specimen Description BLOOD RIGHT HAND  Final   Special Requests IN PEDIATRIC BOTTLE 3ML  Final   Culture NO GROWTH 1 DAY  Final   Report Status PENDING  Incomplete      Radiology Studies: Ct Abdomen Pelvis W Contrast 07/14/2015  Markedly abnormal segment of distal ileum, 10 cm proximal to the ileocecal valve. This has a focal contained perforation and intense inflammation. Leading considerations are inflammatory bowel disease or an ulcerated small bowel neoplasm. 2. Diverticulosis. 3. 3.5 cm distal abdominal aortic aneurysm, continuing to the bifurcation. 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   Scheduled Meds: . cloNIDine  0.1 mg Transdermal Weekly  . metoprolol  2.5 mg Intravenous Q6H  . piperacillin-tazobactam (ZOSYN)  IV  3.375 g Intravenous Q8H   Continuous Infusions: . sodium chloride 10 mL/hr (07/16/15 0848)     LOS: 2 days   Time spent: 20 minutes   Faye Ramsay, MD [facility] Pager [phone]  If 7PM-7AM, please contact night-coverage [email or web address] Password [facility] 07/16/2015, 10:52 AM

## 2015-07-16 NOTE — Progress Notes (Signed)
Subjective: Abdominal pain continues to slowly improve.  Objective: Vital signs in last 24 hours: Temp:  [97.9 F (36.6 C)-98.4 F (36.9 C)] 98 F (36.7 C) (05/13 0616) Pulse Rate:  [55-66] 60 (05/13 0616) Resp:  [18-19] 19 (05/13 0616) BP: (124-156)/(51-77) 141/51 mmHg (05/13 0616) SpO2:  [99 %-100 %] 100 % (05/13 0616) Weight change:  Last BM Date: 07/12/15  PE: GEN:  Obese, NAD ABD:  Protuberant, mild right lower and right flank tenderness without peritonitis  Lab Results: CBC    Component Value Date/Time   WBC 9.4 07/16/2015 0442   RBC 4.60 07/16/2015 0442   HGB 13.3 07/16/2015 0442   HCT 40.7 07/16/2015 0442   PLT 224 07/16/2015 0442   MCV 88.5 07/16/2015 0442   MCH 28.9 07/16/2015 0442   MCHC 32.7 07/16/2015 0442   RDW 14.0 07/16/2015 0442   LYMPHSABS 4.1* 07/14/2015 0345   MONOABS 0.9 07/14/2015 0345   EOSABS 0.2 07/14/2015 0345   BASOSABS 0.0 07/14/2015 0345   CMP     Component Value Date/Time   NA 142 07/16/2015 0442   K 3.3* 07/16/2015 0442   CL 105 07/16/2015 0442   CO2 26 07/16/2015 0442   GLUCOSE 85 07/16/2015 0442   BUN 8 07/16/2015 0442   CREATININE 1.18* 07/16/2015 0442   CALCIUM 9.2 07/16/2015 0442   PROT 6.5 07/14/2015 0345   ALBUMIN 3.0* 07/14/2015 0345   AST 14* 07/14/2015 0345   ALT 12* 07/14/2015 0345   ALKPHOS 79 07/14/2015 0345   BILITOT 0.6 07/14/2015 0345   GFRNONAA 51* 07/16/2015 0442   GFRAA 59* 07/16/2015 0442   Assessment:  1.  Contained perforation distal ileum, about 10 cm proximal to the ileocecal valve.  Unclear etiology.  Patient does not have symptoms of smoldering inflammatory bowel disease.  Improving on antibiotics. 2.  Abdominal pain, right lower quadrant, likely from #1 above, improving.  Plan:  1.  Clear liquids only. 2.  Continue antibiotics. 3.  CT enterography abdomen/pelvis Monday. 4.  [facility] will revisit Monday after CT enterography results are back.   Landry Dyke 07/16/2015, 1:21  PM   Pager [phone] If no answer or after 5 PM call [phone]

## 2015-07-16 NOTE — Progress Notes (Signed)
  Subjective: Stable and alert.  Says she still has right flank pain.  No worse.  Tolerating clear liquids (1140 cc yesterday) without nausea.  Lots of flatus but no stool. Afebrile.  Heart rate 60. WBC 9400.  Hemoglobin 13.3.  Potassium 3.3.  Objective: Vital signs in last 24 hours: Temp:  [97.9 F (36.6 C)-98.4 F (36.9 C)] 98 F (36.7 C) (05/13 0616) Pulse Rate:  [55-66] 60 (05/13 0616) Resp:  [18-19] 19 (05/13 0616) BP: (124-156)/(51-77) 141/51 mmHg (05/13 0616) SpO2:  [99 %-100 %] 100 % (05/13 0616) Last BM Date: 07/12/15  Intake/Output from previous day: 05/12 0701 - 05/13 0700 In: 1290 [P.O.:1140; IV Piggyback:150] Out: 650 [Urine:650] Intake/Output this shift:    General appearance: Alert.  Cooperative.  No distress.  Obese. Resp: clear to auscultation bilaterally GI: Obese.  Soft.  Not distended.  Nontender.  No mass.  No hernia.  Lower midline incision well-healed from remote surgery   Lab Results:   Recent Labs  07/15/15 0736 07/16/15 0442  WBC 8.2 9.4  HGB 13.1 13.3  HCT 41.4 40.7  PLT 199 224   BMET  Recent Labs  07/15/15 0736 07/16/15 0442  NA 142 142  K 3.5 3.3*  CL 105 105  CO2 24 26  GLUCOSE 95 85  BUN 12 8  CREATININE 1.16* 1.18*  CALCIUM 9.3 9.2   PT/INR No results for input(s): LABPROT, INR in the last 72 hours. ABG No results for input(s): PHART, HCO3 in the last 72 hours.  Invalid input(s): PCO2, PO2  Studies/Results: No results found.  Anti-infectives: Anti-infectives    Start     Dose/Rate Route Frequency Ordered Stop   07/14/15 1000  piperacillin-tazobactam (ZOSYN) IVPB 3.375 g     3.375 g 12.5 mL/hr over 240 Minutes Intravenous Every 8 hours 07/14/15 0322     07/14/15 0330  piperacillin-tazobactam (ZOSYN) IVPB 3.375 g     3.375 g 100 mL/hr over 30 Minutes Intravenous  Once 07/14/15 0322 07/14/15 0432   07/14/15 0130  ciprofloxacin (CIPRO) IVPB 400 mg     400 mg 200 mL/hr over 60 Minutes Intravenous  Once 07/14/15  0117 07/14/15 0339   07/14/15 0130  metroNIDAZOLE (FLAGYL) IVPB 500 mg     500 mg 100 mL/hr over 60 Minutes Intravenous  Once 07/14/15 0117 07/14/15 [id number]      Assessment/Plan: Inflammatory process with possible contained perforation distal ileum.  No evidence of drainable abscess, sepsis, obstruction, bleeding, or peritonitis. Primary considerations are inflammatory bowel disease, less likely perforated neoplasm. Continue clear liquid only Continue antibiotics No indication for surgical intervention GI is following  GI plans CT enterography and further workup, which is appropriate. I would not do capsule enterography due to risk of obstruction.  We will stop by to see her again on Monday.   LOS: 2 days    Tyjanae Bartek M 07/16/2015

## 2015-07-17 LAB — GLUCOSE, CAPILLARY
Glucose-Capillary: 85 mg/dL (ref 65–99)
Glucose-Capillary: 87 mg/dL (ref 65–99)
Glucose-Capillary: 91 mg/dL (ref 65–99)
Glucose-Capillary: 99 mg/dL (ref 65–99)

## 2015-07-17 LAB — CBC
HCT: 42.6 % (ref 36.0–46.0)
Hemoglobin: 13.5 g/dL (ref 12.0–15.0)
MCH: 28.1 pg (ref 26.0–34.0)
MCHC: 31.7 g/dL (ref 30.0–36.0)
MCV: 88.8 fL (ref 78.0–100.0)
Platelets: 206 10*3/uL (ref 150–400)
RBC: 4.8 MIL/uL (ref 3.87–5.11)
RDW: 13.8 % (ref 11.5–15.5)
WBC: 9 10*3/uL (ref 4.0–10.5)

## 2015-07-17 LAB — BASIC METABOLIC PANEL
Anion gap: 11 (ref 5–15)
BUN: <5 mg/dL — ABNORMAL LOW (ref 6–20)
CO2: 23 mmol/L (ref 22–32)
Calcium: 9.2 mg/dL (ref 8.9–10.3)
Chloride: 107 mmol/L (ref 101–111)
Creatinine, Ser: 1.23 mg/dL — ABNORMAL HIGH (ref 0.44–1.00)
GFR calc Af Amer: 56 mL/min — ABNORMAL LOW (ref >60)
GFR calc non Af Amer: 48 mL/min — ABNORMAL LOW (ref >60)
Glucose, Bld: 135 mg/dL — ABNORMAL HIGH (ref 65–99)
Potassium: 3.6 mmol/L (ref 3.5–5.1)
Sodium: 141 mmol/L (ref 135–145)

## 2015-07-17 MED ORDER — POTASSIUM CHLORIDE CRYS ER 20 MEQ PO TBCR
40.0000 meq | EXTENDED_RELEASE_TABLET | Freq: Once | ORAL | Status: AC
  Administered 2015-07-17: 40 meq via ORAL
  Filled 2015-07-17: qty 2

## 2015-07-17 MED ORDER — SODIUM CHLORIDE 0.9 % IV SOLN
INTRAVENOUS | Status: DC
  Administered 2015-07-17 – 2015-07-19 (×3): via INTRAVENOUS

## 2015-07-17 MED ORDER — OXYCODONE-ACETAMINOPHEN 5-325 MG PO TABS
1.0000 | ORAL_TABLET | ORAL | Status: DC | PRN
  Administered 2015-07-18 – 2015-07-21 (×9): 2 via ORAL
  Filled 2015-07-17 (×10): qty 2

## 2015-07-17 MED ORDER — HYDROMORPHONE HCL 1 MG/ML IJ SOLN
0.5000 mg | INTRAMUSCULAR | Status: DC | PRN
Start: 2015-07-17 — End: 2015-07-21
  Administered 2015-07-17 – 2015-07-20 (×7): 0.5 mg via INTRAVENOUS
  Filled 2015-07-17 (×7): qty 1

## 2015-07-17 NOTE — Progress Notes (Signed)
Pharmacy Antibiotic Note  Kathy Herrera is a 55 y.o. female admitted on 07/13/2015 with intra-abdominal infection.  Pharmacy has been consulted for Zosyn dosing.  Day #4 of abx abdominal infection. Imaging shows perforated viscus arounf the terminal ileum concerning for inflammatory bowel vs ulcerated neoplasm. Currently no indications for surgical intervention. GI plans for CT enterography tomorrow. Afebrile, WBC down to wnl  Plan: Continue Zosyn 3.375g IV q8h (4 hour infusion) Monitor clinical picture, renal function F/U C&S, abx deescalation / LOT  Height: 5\' 7"  (170.2 cm) Weight: 260 lb (117.935 kg) IBW/kg (Calculated) : 61.6  Temp (24hrs), Avg:98.1 F (36.7 C), Min:98 F (36.7 C), Max:98.2 F (36.8 C)   Recent Labs Lab 07/13/15 1621 07/14/15 0200 07/14/15 0345 07/14/15 0354 07/15/15 0736 07/16/15 0442 07/17/15 0645  WBC 10.6*  --  11.3*  --  8.2 9.4 9.0  CREATININE 1.21*  --  1.05*  --  1.16* 1.18* 1.23*  LATICACIDVEN  --  1.5  --  1.0  --   --   --     Estimated Creatinine Clearance: 67.8 mL/min (by C-G formula based on Cr of 1.23).    Allergies  Allergen Reactions  . Shrimp [Shellfish Allergy] Hives    May be seasoning not always   Thank you for allowing pharmacy to be a part of this patient's care.  Elenor Quinones, PharmD, BCPS Clinical Pharmacist Pager [phone] 07/17/2015 10:45 AM

## 2015-07-17 NOTE — Progress Notes (Signed)
Patient ID: Kathy Herrera [facility], female   DOB: 02-02-1961, 55 y.o.   MRN: [id number]    PROGRESS NOTE    Kathy Herrera [facility]  [id number] DOB: 1960-09-08 DOA: 07/13/2015  PCP: Arnette Norris, MD   Brief Narrative:  55 year old female presented to [facility] for evaluation of right-sided flank pain for several days. CT scan notable for a markedly abnormal segment of distal ileum 10 cm proximal to the ileocecal valve this had a focal contained perforation and intense inflammation around this area. Pt started on Cipro and Flagyl in ED, coverage broadened to Zosyn. Surgery and GI team consulted.   Assessment & Plan:   Principal Problem:   ? Perforated viscus, distal ileum - no indications for surgical intervention - etiology remains unclear, ? Inflammatory bowel disease, perforated neoplasm (thought to be less likely)  - appreciate surgery and GI team following - continue Zosyn day #3 - keep on clear liquids  - GI plans CT enterography abd/pelvis tomorrow   Active Problems:   Hypertension, essential - reasonable inpatient control     Acute kidney injury - secondary to the above, pre renal etiology - Cr slightly up this AM, place on IVF for next 24 hours as pt still on clear liquids and with poor oral intake  - BMP in AM    Hypokalemia  - supplemented but still on low end of normal  - will give one dose of K-dur today  - BMP in AM    Diarrhea this AM - monitor for now  - has not been on any stool softeners - if recurrent, will check C. Diff by PCR    Morbid obesity - Body mass index is 40.71 kg/(m^2)  DVT prophylaxis: SCD's Code Status: Full  Family Communication: Patient at bedside  Disposition Plan: Home once consultants clear for d/c  Consultants:   Surgery  GI  Procedures:   None  Antimicrobials:   Zosyn 5/11 -->  Subjective: Reports feeling better but still tender in the right flank area, watery diarrhea this AM.   Objective: Filed Vitals:   07/16/15 1517 07/16/15 2129  07/17/15 0031 07/17/15 0548  BP: 139/87 138/72 157/80 127/70  Pulse: 62 61 57 58  Temp: 98.2 F (36.8 C) 98 F (36.7 C)  98.2 F (36.8 C)  TempSrc: Oral Oral  Oral  Resp: 19 19  18   Height:      Weight:      SpO2: 100% 98%  98%    Intake/Output Summary (Last 24 hours) at 07/17/15 0852 Last data filed at 07/17/15 0800  Gross per 24 hour  Intake   2062 ml  Output   1550 ml  Net    512 ml   Filed Weights   07/13/15 1556  Weight: 117.935 kg (260 lb)    Examination:  General exam: Appears calm and comfortable  Respiratory system: Clear to auscultation. Respiratory effort normal. Cardiovascular system: S1 & S2 heard, RRR. No JVD, rubs, gallops or clicks. No pedal edema. Gastrointestinal system: Abdomen is nondistended, soft and nontender. No organomegaly or masses felt.  Central nervous system: Alert and oriented. No focal neurological deficits. Extremities: Symmetric 5 x 5 power.  Data Reviewed: I have personally reviewed following labs and imaging studies  CBC:  Recent Labs Lab 07/13/15 1621 07/14/15 0345 07/15/15 0736 07/16/15 0442 07/17/15 0645  WBC 10.6* 11.3* 8.2 9.4 9.0  NEUTROABS  --  6.0  --   --   --   HGB 15.5* 13.1 13.1 13.3 13.5  HCT 47.1* 40.7 41.4 40.7 42.6  MCV 87.2 87.2 87.9 88.5 88.8  PLT 243 220 199 224 [id number]   Basic Metabolic Panel:  Recent Labs Lab 07/13/15 1621 07/14/15 0345 07/15/15 0736 07/16/15 0442 07/17/15 0645  NA 138 140 142 142 141  K 3.9 3.5 3.5 3.3* 3.6  CL 101 103 105 105 107  CO2 26 26 24 26 23   GLUCOSE 135* 98 95 85 135*  BUN 15 14 12 8  <5*  CREATININE 1.21* 1.05* 1.16* 1.18* 1.23*  CALCIUM 10.1 9.1 9.3 9.2 9.2   Liver Function Tests:  Recent Labs Lab 07/13/15 1621 07/14/15 0345  AST 16 14*  ALT 13* 12*  ALKPHOS 93 79  BILITOT 0.6 0.6  PROT 7.6 6.5  ALBUMIN 3.4* 3.0*   Cardiac Enzymes:  Recent Labs Lab 07/13/15 2209  TROPONINI <0.03   CBG:  Recent Labs Lab 07/16/15 0611 07/16/15 1152  07/16/15 1732 07/16/15 2353 07/17/15 0546  GLUCAP 98 83 98 151* 91   Urine analysis:    Component Value Date/Time   COLORURINE YELLOW 07/13/2015 1904   APPEARANCEUR CLEAR 07/13/2015 1904   LABSPEC 1.024 07/13/2015 1904   PHURINE 6.0 07/13/2015 1904   GLUCOSEU NEGATIVE 07/13/2015 1904   HGBUR NEGATIVE 07/13/2015 1904   BILIRUBINUR SMALL* 07/13/2015 1904   KETONESUR 15* 07/13/2015 1904   PROTEINUR NEGATIVE 07/13/2015 1904   UROBILINOGEN 0.2 01/21/2014 0854   NITRITE NEGATIVE 07/13/2015 1904   LEUKOCYTESUR NEGATIVE 07/13/2015 1904   Recent Results (from the past 240 hour(s))  Blood culture (routine x 2)     Status: None (Preliminary result)   Collection Time: 07/14/15  2:20 AM  Result Value Ref Range Status   Specimen Description BLOOD RIGHT ARM  Final   Special Requests BOTTLES DRAWN AEROBIC ONLY 10ML  Final   Culture NO GROWTH 2 DAYS  Final   Report Status PENDING  Incomplete  Blood culture (routine x 2)     Status: None (Preliminary result)   Collection Time: 07/14/15  2:25 AM  Result Value Ref Range Status   Specimen Description BLOOD RIGHT HAND  Final   Special Requests IN PEDIATRIC BOTTLE 3ML  Final   Culture NO GROWTH 2 DAYS  Final   Report Status PENDING  Incomplete      Radiology Studies: Ct Abdomen Pelvis W Contrast 07/14/2015  Markedly abnormal segment of distal ileum, 10 cm proximal to the ileocecal valve. This has a focal contained perforation and intense inflammation. Leading considerations are inflammatory bowel disease or an ulcerated small bowel neoplasm. 2. Diverticulosis. 3. 3.5 cm distal abdominal aortic aneurysm, continuing to the bifurcation. 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   Scheduled Meds: . cloNIDine  0.1 mg Transdermal Weekly  . metoprolol  2.5 mg Intravenous Q6H  . piperacillin-tazobactam (ZOSYN)  IV  3.375 g Intravenous Q8H   Continuous Infusions: . sodium chloride 10 mL/hr (07/16/15 0848)  . sodium chloride        LOS: 3 days   Time spent: 20 minutes   Faye Ramsay, MD [facility] Pager [phone]  If 7PM-7AM, please contact night-coverage [email or web address] Password [facility] 07/17/2015, 8:52 AM

## 2015-07-18 ENCOUNTER — Ambulatory Visit: Payer: Commercial Managed Care - HMO | Admitting: Endocrinology

## 2015-07-18 LAB — BASIC METABOLIC PANEL
Anion gap: 8 (ref 5–15)
BUN: <5 mg/dL — ABNORMAL LOW (ref 6–20)
CO2: 22 mmol/L (ref 22–32)
Calcium: 8.8 mg/dL — ABNORMAL LOW (ref 8.9–10.3)
Chloride: 109 mmol/L (ref 101–111)
Creatinine, Ser: 1.03 mg/dL — ABNORMAL HIGH (ref 0.44–1.00)
GFR calc Af Amer: >60 mL/min (ref >60)
GFR calc non Af Amer: 60 mL/min — ABNORMAL LOW (ref >60)
Glucose, Bld: 103 mg/dL — ABNORMAL HIGH (ref 65–99)
Potassium: 3.9 mmol/L (ref 3.5–5.1)
Sodium: 139 mmol/L (ref 135–145)

## 2015-07-18 LAB — CBC
HCT: 40.9 % (ref 36.0–46.0)
Hemoglobin: 13.2 g/dL (ref 12.0–15.0)
MCH: 28.4 pg (ref 26.0–34.0)
MCHC: 32.3 g/dL (ref 30.0–36.0)
MCV: 88 fL (ref 78.0–100.0)
Platelets: 198 10*3/uL (ref 150–400)
RBC: 4.65 MIL/uL (ref 3.87–5.11)
RDW: 13.8 % (ref 11.5–15.5)
WBC: 8.9 10*3/uL (ref 4.0–10.5)

## 2015-07-18 LAB — C DIFFICILE QUICK SCREEN W PCR REFLEX
C Diff antigen: NEGATIVE
C Diff interpretation: NEGATIVE
C Diff toxin: NEGATIVE

## 2015-07-18 LAB — GLUCOSE, CAPILLARY
Glucose-Capillary: 88 mg/dL (ref 65–99)
Glucose-Capillary: 92 mg/dL (ref 65–99)

## 2015-07-18 MED ORDER — ENOXAPARIN SODIUM 60 MG/0.6ML ~~LOC~~ SOLN
60.0000 mg | SUBCUTANEOUS | Status: DC
  Administered 2015-07-18 – 2015-07-20 (×3): 60 mg via SUBCUTANEOUS
  Filled 2015-07-18 (×4): qty 0.6

## 2015-07-18 NOTE — Progress Notes (Signed)
Subjective: Pt tolerating clears, having multiple loose stools.  Pt pain is better than on admit.  She says pain is RUQ, more towards the back of the abdomen.  She is hungry and would like to eat.   She has limited ambulation at home and shops with a cart because of back pain/arthritis and is on disability for this at home.  Objective: Vital signs in last 24 hours: Temp:  [97.5 F (36.4 C)-98.1 F (36.7 C)] 97.8 F (36.6 C) (05/15 0457) Pulse Rate:  [55-60] 57 (05/15 0457) Resp:  [18-20] 18 (05/15 0457) BP: (129-158)/(60-88) 143/88 mmHg (05/15 0457) SpO2:  [98 %-100 %] 98 % (05/15 0457) Last BM Date: 07/16/15   1080 PO 600 IV BM x3 Urine 2250 Afebrile, VSS,  CBC this AM   WBC 8.9 CT scan 07/14/15:  Markedly abnormal segment of distal ileum, 10 cm proximal to the ileocecal valve. This has a focal contained perforation and intense inflammation. Leading considerations are inflammatory bowel disease or an ulcerated small bowel neoplasm.   Diverticulosis.  3.5 CM AAA   Intake/Output from previous day: 05/14 0701 - 05/15 0700 In: 1680 [P.O.:1080; I.V.:600] Out: 2250 [Urine:2250] Intake/Output this shift:    General appearance: alert, cooperative and no distress GI: soft, she is not really tender to palpation, she says pain is posterior upper right back but not worse with palpation of the abdomen.    Lab Results:   Recent Labs  07/17/15 0645 07/18/15 0636  WBC 9.0 8.9  HGB 13.5 13.2  HCT 42.6 40.9  PLT 206 198    BMET  Recent Labs  07/16/15 0442 07/17/15 0645  NA 142 141  K 3.3* 3.6  CL 105 107  CO2 26 23  GLUCOSE 85 135*  BUN 8 <5*  CREATININE 1.18* 1.23*  CALCIUM 9.2 9.2   PT/INR No results for input(s): LABPROT, INR in the last 72 hours.   Recent Labs Lab 07/13/15 1621 07/14/15 0345  AST 16 14*  ALT 13* 12*  ALKPHOS 93 79  BILITOT 0.6 0.6  PROT 7.6 6.5  ALBUMIN 3.4* 3.0*     Lipase  No results found for: LIPASE   Studies/Results: No  results found.  Prior to Admission medications   Medication Sig Start Date End Date Taking? Authorizing Provider  aspirin EC 81 MG tablet Take 81 mg by mouth daily.   Yes Historical Provider, MD  atenolol (TENORMIN) 100 MG tablet Take 1 tablet (100 mg total) by mouth daily. 12/09/14  Yes Lucille Passy, MD  cloNIDine (CATAPRES) 0.1 MG tablet Take 1 tablet (0.1 mg total) by mouth daily. 08/31/14  Yes Lucille Passy, MD  diclofenac (VOLTAREN) 50 MG EC tablet Take 50 mg by mouth 3 (three) times daily.   Yes Historical Provider, MD  hydrochlorothiazide (HYDRODIURIL) 25 MG tablet Take 1 tablet (25 mg total) by mouth daily. 05/11/15  Yes Lucille Passy, MD  lisinopril (PRINIVIL,ZESTRIL) 40 MG tablet Take 1 tablet (40 mg total) by mouth daily. 12/09/14  Yes Lucille Passy, MD  potassium chloride (K-DUR,KLOR-CON) 10 MEQ tablet Take 1 tablet (10 mEq total) by mouth daily. 12/09/14  Yes Lucille Passy, MD     Medications: . cloNIDine  0.1 mg Transdermal Weekly  . metoprolol  2.5 mg Intravenous Q6H  . piperacillin-tazobactam (ZOSYN)  IV  3.375 g Intravenous Q8H   . sodium chloride 10 mL/hr (07/16/15 0848)  . sodium chloride 75 mL/hr at 07/17/15 2239    Assessment/Plan Inflammatory process/possible  contained perforation distal ileum without drainable abscess, sepsis, obstruction, bleeding or peritonitis Possible IBD/perforated neoplasm Hypertension Acute kidney injury Body mass index is 40.7 FEN:  Clear diet/IV fluids ID: day 6 antibiotics/day 5 Zosyn VTE:  SCD's only  Plan:  CT enterography today per Dr. Erlinda Hong note.  We will follow with you, no acute surgical issue right now.  She could be on anticoagulation from our standpoint, for DVT prophylaxis.         LOS: 4 days    Faduma Cho 07/18/2015 [phone]

## 2015-07-18 NOTE — Progress Notes (Signed)
[place] 12:17 PM  Subjective: Patient doing a little better and she was seen and examined and her hospital computer chart and office computer chart was reviewed and her case discussed with my partner Dr. Paulita Fujita  Objective: Vital signs stable afebrile no acute distress abdomen is soft nontender occasional bowel sound labs stable  Assessment: Abnormal TI  Plan: We will go ahead and order a CT enterography with further workup and plans pending those findings  [facility] E  Pager [phone] After 5PM or if no answer call [phone]

## 2015-07-18 NOTE — Progress Notes (Addendum)
Patient ID: Kathy Herrera [facility], female   DOB: 1960/03/16, 55 y.o.   MRN: [id number]    PROGRESS NOTE    [name] [facility]  [id number] DOB: 01-15-1961 DOA: 07/13/2015  PCP: Arnette Norris, MD   Brief Narrative:  55 year old female presented to [facility] for evaluation of right-sided flank pain for several days. CT scan notable for a markedly abnormal segment of distal ileum 10 cm proximal to the ileocecal valve this had a focal contained perforation and intense inflammation around this area. Pt started on Cipro and Flagyl in ED, coverage broadened to Zosyn. Surgery and GI team consulted.   Assessment & Plan:   Principal Problem:   ? Perforated viscus, distal ileum - no indications for surgical intervention - etiology remains unclear, ? Inflammatory bowel disease, perforated neoplasm (thought to be less likely)  - appreciate surgery and GI team following - continue Zosyn day #4 - keep on clear liquids  - GI plans CT enterography abd/pelvis today   Active Problems:   Hypertension, essential - reasonable inpatient control     Acute kidney injury - secondary to the above, pre renal etiology - improving  - BMP in AM    Hypokalemia  - supplemented and WNL this AM - BMP in AM    Diarrhea - per pt over 10 episodes - has not been on any stool softeners - C. Diff requested     Morbid obesity - Body mass index is 40.71 kg/(m^2)  DVT prophylaxis: SCD's, Lovenox SQ Code Status: Full  Family Communication: Patient at bedside  Disposition Plan: Home once consultants clear for d/c  Consultants:   Surgery  GI  Procedures:   None  Antimicrobials:   Zosyn 5/11 -->  Subjective: Reports feeling better but still tender in the right flank area, watery diarrhea again this AM.   Objective: Filed Vitals:   07/17/15 1432 07/17/15 2108 07/18/15 0020 07/18/15 0457  BP: 129/60 158/84 141/76 143/88  Pulse: 60 57 55 57  Temp: 97.5 F (36.4 C) 98.1 F (36.7 C)  97.8 F (36.6 C)  TempSrc:  Oral Oral    Resp:  20  18  Height:      Weight:      SpO2: 99% 100%  98%    Intake/Output Summary (Last 24 hours) at 07/18/15 1128 Last data filed at 07/18/15 0813  Gross per 24 hour  Intake   1320 ml  Output   2200 ml  Net   -880 ml   Filed Weights   07/13/15 1556  Weight: 117.935 kg (260 lb)    Examination:  General exam: Appears calm and comfortable  Respiratory system: Clear to auscultation. Respiratory effort normal. Cardiovascular system: S1 & S2 heard, RRR. No JVD, rubs, gallops or clicks. No pedal edema. Gastrointestinal system: Abdomen is nondistended, soft and nontender. No organomegaly or masses felt.  Central nervous system: Alert and oriented. No focal neurological deficits. Extremities: Symmetric 5 x 5 power.  Data Reviewed: I have personally reviewed following labs and imaging studies  CBC:  Recent Labs Lab 07/14/15 0345 07/15/15 0736 07/16/15 0442 07/17/15 0645 07/18/15 0636  WBC 11.3* 8.2 9.4 9.0 8.9  NEUTROABS 6.0  --   --   --   --   HGB 13.1 13.1 13.3 13.5 13.2  HCT 40.7 41.4 40.7 42.6 40.9  MCV 87.2 87.9 88.5 88.8 88.0  PLT 220 199 224 206 [id number]   Basic Metabolic Panel:  Recent Labs Lab 07/14/15 0345 07/15/15 0736 07/16/15 0442 07/17/15 0645  07/18/15 0636  NA 140 142 142 141 139  K 3.5 3.5 3.3* 3.6 3.9  CL 103 105 105 107 109  CO2 26 24 26 23 22   GLUCOSE 98 95 85 135* 103*  BUN 14 12 8  <5* <5*  CREATININE 1.05* 1.16* 1.18* 1.23* 1.03*  CALCIUM 9.1 9.3 9.2 9.2 8.8*   Liver Function Tests:  Recent Labs Lab 07/13/15 1621 07/14/15 0345  AST 16 14*  ALT 13* 12*  ALKPHOS 93 79  BILITOT 0.6 0.6  PROT 7.6 6.5  ALBUMIN 3.4* 3.0*   Cardiac Enzymes:  Recent Labs Lab 07/13/15 2209  TROPONINI <0.03   CBG:  Recent Labs Lab 07/17/15 0546 07/17/15 1202 07/17/15 1701 07/17/15 2356 07/18/15 0544  GLUCAP 91 87 99 85 88   Urine analysis:    Component Value Date/Time   COLORURINE YELLOW 07/13/2015 1904   APPEARANCEUR  CLEAR 07/13/2015 1904   LABSPEC 1.024 07/13/2015 1904   PHURINE 6.0 07/13/2015 1904   GLUCOSEU NEGATIVE 07/13/2015 1904   HGBUR NEGATIVE 07/13/2015 1904   BILIRUBINUR SMALL* 07/13/2015 1904   KETONESUR 15* 07/13/2015 1904   PROTEINUR NEGATIVE 07/13/2015 1904   UROBILINOGEN 0.2 01/21/2014 0854   NITRITE NEGATIVE 07/13/2015 1904   LEUKOCYTESUR NEGATIVE 07/13/2015 1904   Recent Results (from the past 240 hour(s))  Blood culture (routine x 2)     Status: None (Preliminary result)   Collection Time: 07/14/15  2:20 AM  Result Value Ref Range Status   Specimen Description BLOOD RIGHT ARM  Final   Special Requests BOTTLES DRAWN AEROBIC ONLY 10ML  Final   Culture NO GROWTH 3 DAYS  Final   Report Status PENDING  Incomplete  Blood culture (routine x 2)     Status: None (Preliminary result)   Collection Time: 07/14/15  2:25 AM  Result Value Ref Range Status   Specimen Description BLOOD RIGHT HAND  Final   Special Requests IN PEDIATRIC BOTTLE 3ML  Final   Culture NO GROWTH 3 DAYS  Final   Report Status PENDING  Incomplete      Radiology Studies: Ct Abdomen Pelvis W Contrast 07/14/2015  Markedly abnormal segment of distal ileum, 10 cm proximal to the ileocecal valve. This has a focal contained perforation and intense inflammation. Leading considerations are inflammatory bowel disease or an ulcerated small bowel neoplasm. 2. Diverticulosis. 3. 3.5 cm distal abdominal aortic aneurysm, continuing to the bifurcation. 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   Scheduled Meds: . cloNIDine  0.1 mg Transdermal Weekly  . enoxaparin (LOVENOX) injection  40 mg Subcutaneous Q24H  . metoprolol  2.5 mg Intravenous Q6H  . piperacillin-tazobactam (ZOSYN)  IV  3.375 g Intravenous Q8H   Continuous Infusions: . sodium chloride 10 mL/hr (07/16/15 0848)  . sodium chloride 75 mL/hr at 07/17/15 2239     LOS: 4 days   Time spent: 20 minutes   Faye Ramsay, MD [facility] Pager [phone]  If 7PM-7AM, please contact night-coverage [email or web address] Password [facility] 07/18/2015, 11:28 AM

## 2015-07-19 ENCOUNTER — Inpatient Hospital Stay (HOSPITAL_COMMUNITY): Payer: Commercial Managed Care - HMO

## 2015-07-19 LAB — CBC
HCT: 39.8 % (ref 36.0–46.0)
Hemoglobin: 12.8 g/dL (ref 12.0–15.0)
MCH: 28.1 pg (ref 26.0–34.0)
MCHC: 32.2 g/dL (ref 30.0–36.0)
MCV: 87.5 fL (ref 78.0–100.0)
Platelets: 219 10*3/uL (ref 150–400)
RBC: 4.55 MIL/uL (ref 3.87–5.11)
RDW: 13.8 % (ref 11.5–15.5)
WBC: 7.4 10*3/uL (ref 4.0–10.5)

## 2015-07-19 LAB — BASIC METABOLIC PANEL
Anion gap: 11 (ref 5–15)
BUN: <5 mg/dL — ABNORMAL LOW (ref 6–20)
CO2: 25 mmol/L (ref 22–32)
Calcium: 9.3 mg/dL (ref 8.9–10.3)
Chloride: 106 mmol/L (ref 101–111)
Creatinine, Ser: 1.14 mg/dL — ABNORMAL HIGH (ref 0.44–1.00)
GFR calc Af Amer: >60 mL/min (ref >60)
GFR calc non Af Amer: 53 mL/min — ABNORMAL LOW (ref >60)
Glucose, Bld: 87 mg/dL (ref 65–99)
Potassium: 3.7 mmol/L (ref 3.5–5.1)
Sodium: 142 mmol/L (ref 135–145)

## 2015-07-19 LAB — CULTURE, BLOOD (ROUTINE X 2)
Culture: NO GROWTH
Culture: NO GROWTH

## 2015-07-19 MED ORDER — IOPAMIDOL (ISOVUE-300) INJECTION 61%
INTRAVENOUS | Status: AC
  Filled 2015-07-19: qty 100

## 2015-07-19 MED ORDER — HYDRALAZINE HCL 20 MG/ML IJ SOLN
5.0000 mg | INTRAMUSCULAR | Status: DC | PRN
  Administered 2015-07-19: 5 mg via INTRAVENOUS
  Filled 2015-07-19: qty 1

## 2015-07-19 MED ORDER — SODIUM CHLORIDE 0.9% FLUSH: 10.0000 mL | INTRAVENOUS | Status: DC | PRN

## 2015-07-19 MED ORDER — BARIUM SULFATE 0.1 % PO SUSP
ORAL | Status: AC
  Filled 2015-07-19: qty 3

## 2015-07-19 NOTE — Progress Notes (Signed)
IV consult placed as pt needs a 20G iv around antecubital area per CT dept

## 2015-07-19 NOTE — Progress Notes (Signed)
Patient ID: Kathy Herrera [facility], female   DOB: 1961-02-20, 55 y.o.   MRN: [id number]    PROGRESS NOTE    Kathy Herrera [facility]  [id number] DOB: [birth year] DOA: 07/13/2015  PCP: Arnette Norris, MD   Brief Narrative:  55 year old female presented to [facility] for evaluation of right-sided flank pain for several days. CT scan notable for a markedly abnormal segment of distal ileum 10 cm proximal to the ileocecal valve this had a focal contained perforation and intense inflammation around this area. Pt started on Cipro and Flagyl in ED, coverage broadened to Zosyn. Surgery and GI team consulted.   Assessment & Plan:   Principal Problem:   ? Perforated viscus, distal ileum - no indications for surgical intervention - etiology remains unclear, ? Inflammatory bowel disease, perforated neoplasm (thought to be less likely)  - appreciate surgery and GI team following - continue Zosyn day #5 - keep on clear liquids  - GI plans CT enterography abd/pelvis today   Active Problems:   Hypertension, essential - SBP in 170's this AM, will add Hydralazine as needed     Acute kidney injury - secondary to the above, pre renal etiology - improving but remains 1.1 - 1.2 since admission  - BMP in AM    Hypokalemia  - supplemented and WNL this AM    Diarrhea - per pt over 10 episodes - improving, C. Diff by PCR negative, take off contact precautions  - add probiotic     Morbid obesity - Body mass index is 40.71 kg/(m^2)  DVT prophylaxis: SCD's, Lovenox SQ Code Status: Full  Family Communication: Patient at bedside  Disposition Plan: Home once consultants clear for d/c  Consultants:   Surgery  GI  Procedures:   None  Antimicrobials:   Zosyn 5/11 -->  Subjective: Reports feeling better but still tender in the right flank area. Want to eat more.   Objective: Filed Vitals:   07/18/15 1514 07/18/15 2218 07/18/15 2300 07/19/15 0506  BP: 160/80 172/90 141/67 170/95  Pulse: 61 65 66 65  Temp: 97.7 F  (36.5 C) 98.1 F (36.7 C)  98.1 F (36.7 C)  TempSrc: Oral Oral  Oral  Resp: 20 20    Height:      Weight:      SpO2: 97% 100%  100%    Intake/Output Summary (Last 24 hours) at 07/19/15 1005 Last data filed at 07/19/15 0506  Gross per 24 hour  Intake   1995 ml  Output   3000 ml  Net  -1005 ml   Filed Weights   07/13/15 1556  Weight: 117.935 kg (260 lb)    Examination:  General exam: Appears calm and comfortable  Respiratory system: Clear to auscultation. Respiratory effort normal. Cardiovascular system: S1 & S2 heard, RRR. No JVD, rubs, gallops or clicks. No pedal edema. Gastrointestinal system: Abdomen is nondistended, soft and nontender. No organomegaly or masses felt.  Central nervous system: Alert and oriented. No focal neurological deficits. Extremities: Symmetric 5 x 5 power.  Data Reviewed: I have personally reviewed following labs and imaging studies  CBC:  Recent Labs Lab 07/14/15 0345 07/15/15 0736 07/16/15 0442 07/17/15 0645 07/18/15 0636 07/19/15 0642  WBC 11.3* 8.2 9.4 9.0 8.9 7.4  NEUTROABS 6.0  --   --   --   --   --   HGB 13.1 13.1 13.3 13.5 13.2 12.8  HCT 40.7 41.4 40.7 42.6 40.9 39.8  MCV 87.2 87.9 88.5 88.8 88.0 87.5  PLT 220 199  224 206 198 [id number]   Basic Metabolic Panel:  Recent Labs Lab 07/15/15 0736 07/16/15 0442 07/17/15 0645 07/18/15 0636 07/19/15 0642  NA 142 142 141 139 142  K 3.5 3.3* 3.6 3.9 3.7  CL 105 105 107 109 106  CO2 24 26 23 22 25   GLUCOSE 95 85 135* 103* 87  BUN 12 8 <5* <5* <5*  CREATININE 1.16* 1.18* 1.23* 1.03* 1.14*  CALCIUM 9.3 9.2 9.2 8.8* 9.3   Liver Function Tests:  Recent Labs Lab 07/13/15 1621 07/14/15 0345  AST 16 14*  ALT 13* 12*  ALKPHOS 93 79  BILITOT 0.6 0.6  PROT 7.6 6.5  ALBUMIN 3.4* 3.0*   Cardiac Enzymes:  Recent Labs Lab 07/13/15 2209  TROPONINI <0.03   CBG:  Recent Labs Lab 07/17/15 1202 07/17/15 1701 07/17/15 2356 07/18/15 0544 07/18/15 1218  GLUCAP 87 99 85 88 92    Urine analysis:    Component Value Date/Time   COLORURINE YELLOW 07/13/2015 1904   APPEARANCEUR CLEAR 07/13/2015 1904   LABSPEC 1.024 07/13/2015 1904   PHURINE 6.0 07/13/2015 1904   GLUCOSEU NEGATIVE 07/13/2015 1904   HGBUR NEGATIVE 07/13/2015 1904   BILIRUBINUR SMALL* 07/13/2015 1904   KETONESUR 15* 07/13/2015 1904   PROTEINUR NEGATIVE 07/13/2015 1904   UROBILINOGEN 0.2 01/21/2014 0854   NITRITE NEGATIVE 07/13/2015 1904   LEUKOCYTESUR NEGATIVE 07/13/2015 1904   Recent Results (from the past 240 hour(s))  Blood culture (routine x 2)     Status: None (Preliminary result)   Collection Time: 07/14/15  2:20 AM  Result Value Ref Range Status   Specimen Description BLOOD RIGHT ARM  Final   Special Requests BOTTLES DRAWN AEROBIC ONLY 10ML  Final   Culture NO GROWTH 3 DAYS  Final   Report Status PENDING  Incomplete  Blood culture (routine x 2)     Status: None (Preliminary result)   Collection Time: 07/14/15  2:25 AM  Result Value Ref Range Status   Specimen Description BLOOD RIGHT HAND  Final   Special Requests IN PEDIATRIC BOTTLE 3ML  Final   Culture NO GROWTH 3 DAYS  Final   Report Status PENDING  Incomplete  C difficile quick scan w PCR reflex     Status: None   Collection Time: 07/18/15  1:57 PM  Result Value Ref Range Status   C Diff antigen NEGATIVE NEGATIVE Final   C Diff toxin NEGATIVE NEGATIVE Final   C Diff interpretation Negative for toxigenic C. difficile  Final      Radiology Studies: Ct Abdomen Pelvis W Contrast 07/14/2015  Markedly abnormal segment of distal ileum, 10 cm proximal to the ileocecal valve. This has a focal contained perforation and intense inflammation. Leading considerations are inflammatory bowel disease or an ulcerated small bowel neoplasm. 2. Diverticulosis. 3. 3.5 cm distal abdominal aortic aneurysm, continuing to the bifurcation. 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   Scheduled Meds: . cloNIDine  0.1 mg Transdermal  Weekly  . enoxaparin (LOVENOX) injection  60 mg Subcutaneous Q24H  . metoprolol  2.5 mg Intravenous Q6H  . piperacillin-tazobactam (ZOSYN)  IV  3.375 g Intravenous Q8H   Continuous Infusions: . sodium chloride 10 mL/hr (07/16/15 0848)  . sodium chloride 75 mL/hr at 07/19/15 0144     LOS: 5 days   Time spent: 20 minutes   Faye Ramsay, MD [facility] Pager [phone]  If 7PM-7AM, please contact night-coverage [email or web address] Password [email or web address] 07/19/2015, 10:05 AM

## 2015-07-19 NOTE — Progress Notes (Signed)
   07/19/15 1336  Vitals  Temp 97.8 F (36.6 C)  Temp Source Tympanic  BP (!) 185/92 mmHg  BP Location Left Arm  BP Method Automatic  Pulse Rate 67  Pulse Rate Source Dinamap  Oxygen Therapy  SpO2 100 %  5mg  iv hydralazine administered for elevated blood pressure as charted

## 2015-07-19 NOTE — Progress Notes (Signed)
   07/19/15 1412  Vitals  BP (!) 152/72 mmHg  BP Location Left Arm  BP Method Automatic  Patient Position (if appropriate) Lying  Pulse Rate 73  Pulse Rate Source Dinamap  Oxygen Therapy  SpO2 100 %  O2 Device Room Air  Pain Assessment  Pain Assessment 0-10  Pain Score 7  Pain Type Acute pain  Pain Location Abdomen  Pain Orientation Right  Pain Descriptors / Indicators Aching  Pain Intervention(s) Medication (See eMAR)

## 2015-07-19 NOTE — Progress Notes (Signed)
Called CT dept to enquire about scheduled scan. Informed that i will get a call back with an update

## 2015-07-19 NOTE — Care Management Note (Signed)
Case Management Note  Patient Details  Name: Kathy Herrera MRN: [id number] Date of Birth: 10-23-1960  Subjective/Objective:                    Action/Plan:   Expected Discharge Date:                  Expected Discharge Plan:  Home/Self Care  In-House Referral:     Discharge planning Services     Post Acute Care Choice:    Choice offered to:     DME Arranged:    DME Agency:     HH Arranged:    [place] Agency:     Status of Service:  In process, will continue to follow  Medicare Important Message Given:    Date Medicare IM Given:    Medicare IM give by:    Date Additional Medicare IM Given:    Additional Medicare Important Message give by:     If discussed at [place] of Stay Meetings, dates discussed:  07-19-15  Additional Comments:  Marilu Favre, RN 07/19/2015, 10:57 AM

## 2015-07-19 NOTE — Progress Notes (Signed)
[place] 4:43 PM  Subjective: Patient still with pain but no new complaints and awaiting CT enterography due to needing special IV which was discussed with radiology  Objective: Vital signs stable afebrile no acute distress abdomen is soft essentially nontender except for minimal right-sided discomfort no guarding or rebound normal bowel sounds labs okay  Assessment: Ileal abnormality  Plan: Await CT enterography as above to determine further workup and plans  [facility]  Pager [phone] After 5PM or if no answer call [phone]

## 2015-07-20 LAB — BASIC METABOLIC PANEL
Anion gap: 9 (ref 5–15)
BUN: <5 mg/dL — ABNORMAL LOW (ref 6–20)
CO2: 26 mmol/L (ref 22–32)
Calcium: 9.1 mg/dL (ref 8.9–10.3)
Chloride: 106 mmol/L (ref 101–111)
Creatinine, Ser: 1.16 mg/dL — ABNORMAL HIGH (ref 0.44–1.00)
GFR calc Af Amer: 60 mL/min — ABNORMAL LOW (ref >60)
GFR calc non Af Amer: 52 mL/min — ABNORMAL LOW (ref >60)
Glucose, Bld: 84 mg/dL (ref 65–99)
Potassium: 3.7 mmol/L (ref 3.5–5.1)
Sodium: 141 mmol/L (ref 135–145)

## 2015-07-20 LAB — CBC
HCT: 40 % (ref 36.0–46.0)
Hemoglobin: 12.9 g/dL (ref 12.0–15.0)
MCH: 28 pg (ref 26.0–34.0)
MCHC: 32.3 g/dL (ref 30.0–36.0)
MCV: 87 fL (ref 78.0–100.0)
Platelets: 240 10*3/uL (ref 150–400)
RBC: 4.6 MIL/uL (ref 3.87–5.11)
RDW: 13.8 % (ref 11.5–15.5)
WBC: 7.5 10*3/uL (ref 4.0–10.5)

## 2015-07-20 MED ORDER — FAMOTIDINE 20 MG PO TABS
20.0000 mg | ORAL_TABLET | Freq: Two times a day (BID) | ORAL | Status: DC | PRN
  Administered 2015-07-20: 20 mg via ORAL
  Filled 2015-07-20: qty 1

## 2015-07-20 NOTE — Progress Notes (Signed)
4mg  iv zifran administered for episode of vommitting

## 2015-07-20 NOTE — Progress Notes (Signed)
Patient ID: Makhari Despirito [facility], female   DOB: Jun 30, 1960, 55 y.o.   MRN: [id number]    PROGRESS NOTE    [facility]  [id number] DOB: 05-15-1960 DOA: 07/13/2015  PCP: Arnette Norris, MD   Brief Narrative:  55 year old female presented to [facility] for evaluation of right-sided flank pain for several days. CT scan notable for a markedly abnormal segment of distal ileum 10 cm proximal to the ileocecal valve this had a focal contained perforation and intense inflammation around this area. Pt started on Cipro and Flagyl in ED, coverage broadened to Zosyn. Surgery and GI team consulted.   Assessment & Plan:   Principal Problem:   ? Perforated viscus, distal ileum - no indications for surgical intervention - etiology remains unclear, ? Inflammatory bowel disease, perforated neoplasm (thought to be less likely)  - appreciate surgery and GI team following - continue Zosyn day #8 - keep on clear liquids  - CT enterography abd/pelvis done, results pending   Active Problems:   Hypertension, essential - SBP in 140's this AM - ok to use hydralazine as needed     Acute kidney injury - secondary to the above, pre renal etiology - improving but remains 1.1 - 1.2 since admission  - BMP in AM    Hypokalemia  - supplemented and WNL this AM    Diarrhea - per pt over 10 episodes - improving, C. Diff by PCR negative, take off contact precautions  - add probiotic     Morbid obesity - Body mass index is 40.71 kg/(m^2)  DVT prophylaxis: SCD's, Lovenox SQ Code Status: Full  Family Communication: Patient at bedside  Disposition Plan: Home once consultants clear for d/c  Consultants:   Surgery  GI  Procedures:   None  Antimicrobials:   Zosyn 5/11 -->  Subjective: Reports feeling better but still tender in the right flank area. Wants to eat more.   Objective: Filed Vitals:   07/19/15 1336 07/19/15 1412 07/19/15 2235 07/20/15 0516  BP: 185/92 152/72 142/64 146/80  Pulse: 67 73 69 65    Temp: 97.8 F (36.6 C)  98.4 F (36.9 C) 98.4 F (36.9 C)  TempSrc: Tympanic  Oral Oral  Resp:      Height:      Weight:      SpO2: 100% 100% 100% 99%    Intake/Output Summary (Last 24 hours) at 07/20/15 0617 Last data filed at 07/20/15 0519  Gross per 24 hour  Intake    580 ml  Output   2300 ml  Net  -1720 ml   Filed Weights   07/13/15 1556  Weight: 117.935 kg (260 lb)    Examination:  General exam: Appears calm and comfortable  Respiratory system: Clear to auscultation. Respiratory effort normal. Cardiovascular system: S1 & S2 heard, RRR. No JVD, rubs, gallops or clicks. No pedal edema. Gastrointestinal system: Abdomen is nondistended, soft and nontender. No organomegaly or masses felt.  Central nervous system: Alert and oriented. No focal neurological deficits. Extremities: Symmetric 5 x 5 power.  Data Reviewed: I have personally reviewed following labs and imaging studies  CBC:  Recent Labs Lab 07/14/15 0345  07/16/15 0442 07/17/15 0645 07/18/15 0636 07/19/15 0642 07/20/15 0520  WBC 11.3*  < > 9.4 9.0 8.9 7.4 7.5  NEUTROABS 6.0  --   --   --   --   --   --   HGB 13.1  < > 13.3 13.5 13.2 12.8 12.9  HCT 40.7  < >  40.7 42.6 40.9 39.8 40.0  MCV 87.2  < > 88.5 88.8 88.0 87.5 87.0  PLT 220  < > 224 206 198 219 240  < > = values in this interval not displayed. Basic Metabolic Panel:  Recent Labs Lab 07/16/15 0442 07/17/15 0645 07/18/15 0636 07/19/15 0642 07/20/15 0520  NA 142 141 139 142 141  K 3.3* 3.6 3.9 3.7 3.7  CL 105 107 109 106 106  CO2 26 23 22 25 26   GLUCOSE 85 135* 103* 87 84  BUN 8 <5* <5* <5* <5*  CREATININE 1.18* 1.23* 1.03* 1.14* 1.16*  CALCIUM 9.2 9.2 8.8* 9.3 9.1   Liver Function Tests:  Recent Labs Lab 07/13/15 1621 07/14/15 0345  AST 16 14*  ALT 13* 12*  ALKPHOS 93 79  BILITOT 0.6 0.6  PROT 7.6 6.5  ALBUMIN 3.4* 3.0*   Cardiac Enzymes:  Recent Labs Lab 07/13/15 2209  TROPONINI <0.03   CBG:  Recent Labs Lab  07/17/15 1202 07/17/15 1701 07/17/15 2356 07/18/15 0544 07/18/15 1218  GLUCAP 87 99 85 88 92   Urine analysis:    Component Value Date/Time   COLORURINE YELLOW 07/13/2015 1904   APPEARANCEUR CLEAR 07/13/2015 1904   LABSPEC 1.024 07/13/2015 1904   PHURINE 6.0 07/13/2015 1904   GLUCOSEU NEGATIVE 07/13/2015 1904   HGBUR NEGATIVE 07/13/2015 1904   BILIRUBINUR SMALL* 07/13/2015 1904   KETONESUR 15* 07/13/2015 1904   PROTEINUR NEGATIVE 07/13/2015 1904   UROBILINOGEN 0.2 01/21/2014 0854   NITRITE NEGATIVE 07/13/2015 1904   LEUKOCYTESUR NEGATIVE 07/13/2015 1904   Recent Results (from the past 240 hour(s))  Blood culture (routine x 2)     Status: None   Collection Time: 07/14/15  2:20 AM  Result Value Ref Range Status   Specimen Description BLOOD RIGHT ARM  Final   Special Requests BOTTLES DRAWN AEROBIC ONLY 10ML  Final   Culture NO GROWTH 5 DAYS  Final   Report Status 07/19/2015 FINAL  Final  Blood culture (routine x 2)     Status: None   Collection Time: 07/14/15  2:25 AM  Result Value Ref Range Status   Specimen Description BLOOD RIGHT HAND  Final   Special Requests IN PEDIATRIC BOTTLE 3ML  Final   Culture NO GROWTH 5 DAYS  Final   Report Status 07/19/2015 FINAL  Final  C difficile quick scan w PCR reflex     Status: None   Collection Time: 07/18/15  1:57 PM  Result Value Ref Range Status   C Diff antigen NEGATIVE NEGATIVE Final   C Diff toxin NEGATIVE NEGATIVE Final   C Diff interpretation Negative for toxigenic C. difficile  Final      Radiology Studies: Ct Abdomen Pelvis W Contrast 07/14/2015  Markedly abnormal segment of distal ileum, 10 cm proximal to the ileocecal valve. This has a focal contained perforation and intense inflammation. Leading considerations are inflammatory bowel disease or an ulcerated small bowel neoplasm. 2. Diverticulosis. 3. 3.5 cm distal abdominal aortic aneurysm, continuing to the bifurcation. Mild aneurysmal dilatation of both common iliac  arteries.   Scheduled Meds: . cloNIDine  0.1 mg Transdermal Weekly  . enoxaparin (LOVENOX) injection  60 mg Subcutaneous Q24H  . iopamidol      . metoprolol  2.5 mg Intravenous Q6H  . piperacillin-tazobactam (ZOSYN)  IV  3.375 g Intravenous Q8H   Continuous Infusions: . sodium chloride 10 mL/hr at 07/20/15 0501     LOS: 6 days   Time spent: 20 minutes  Faye Ramsay, MD [facility] Pager [phone]  If 7PM-7AM, please contact night-coverage [email or web address] Password [email or web address] 07/20/2015, 6:17 AM

## 2015-07-20 NOTE — Progress Notes (Signed)
Pharmacy Antibiotic Note  Kathy Herrera is a 55 y.o. female admitted on 07/13/2015 with intra-abdominal infection.  Pharmacy has been consulted for Zosyn dosing.  Day #7 of abx abdominal infection. Imaging shows perforated viscus arounf the terminal ileum concerning for inflammatory bowel vs ulcerated neoplasm. Currently no indications for surgical intervention. CT enterography results are pending. Afebrile, WBC down to wnl  Plan: Continue Zosyn 3.375g IV q8h (4 hour infusion) Monitor clinical picture, renal function F/U LOT for Zosyn? 10 days?  Height: 5\' 7"  (170.2 cm) Weight: 260 lb (117.935 kg) IBW/kg (Calculated) : 61.6  Temp (24hrs), Avg:98.1 F (36.7 C), Min:97.8 F (36.6 C), Max:98.4 F (36.9 C)   Recent Labs Lab 07/14/15 0200  07/14/15 0354  07/16/15 0442 07/17/15 0645 07/18/15 0636 07/19/15 0642 07/20/15 0520  WBC  --   < >  --   < > 9.4 9.0 8.9 7.4 7.5  CREATININE  --   < >  --   < > 1.18* 1.23* 1.03* 1.14* 1.16*  LATICACIDVEN 1.5  --  1.0  --   --   --   --   --   --   < > = values in this interval not displayed.  Estimated Creatinine Clearance: 71.9 mL/min (by C-G formula based on Cr of 1.16).    Allergies  Allergen Reactions  . Shrimp [Shellfish Allergy] Hives    May be seasoning not always   Thank you for allowing pharmacy to be a part of this patient's care.  Elenor Quinones, PharmD, BCPS Clinical Pharmacist Pager [phone] 07/20/2015 10:34 AM

## 2015-07-20 NOTE — Progress Notes (Signed)
[facility] PROGRESS NOTE Subjective patient still has some discomfort but feels much better. 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 Patient states that she does have some diarrhea but is not been very severe. She remains unclear liquids and Zosyn.  Objective: Vital signs in last 24 hours: Temp:  [97.8 F (36.6 C)-98.4 F (36.9 C)] 98.4 F (36.9 C) (05/17 0516) Pulse Rate:  [57-73] 65 (05/17 0516) Resp:  [20] 20 (05/16 1322) BP: (142-193)/(64-122) 146/80 mmHg (05/17 0516) SpO2:  [96 %-100 %] 99 % (05/17 0516) Last BM Date: 07/20/15  Intake/Output from previous day: 05/16 0701 - 05/17 0700 In: 580 [I.V.:580] Out: 2300 [Urine:2300] Intake/Output this shift:    PE: General-- alert watching TV and no distress  Abdomen-- obese but nondistended mild right-sided tenderness bowel sounds present  Lab Results:  Recent Labs  07/18/15 0636 07/19/15 0642 07/20/15 0520  WBC 8.9 7.4 7.5  HGB 13.2 12.8 12.9  HCT 40.9 39.8 40.0  PLT 198 219 240   BMET  Recent Labs  07/18/15 0636 07/19/15 0642 07/20/15 0520  NA 139 142 141  K 3.9 3.7 3.7  CL 109 106 106  CO2 22 25 26   CREATININE 1.03* 1.14* 1.16*   LFT No results for input(s): PROT, AST, ALT, ALKPHOS, BILITOT, BILIDIR, IBILI in the last 72 hours. PT/INR No results for input(s): LABPROT, INR in the last 72 hours. PANCREAS No results for input(s): LIPASE in the last 72 hours.       Studies/Results: Ct Entero Abd/pelvis W/cm  07/20/2015  CLINICAL DATA:  Followup of abnormal CT, demonstrating distal ileal possible perforation inflammation. EXAM: CT ABDOMEN AND PELVIS WITH CONTRAST (ENTEROGRAPHY) TECHNIQUE: Multidetector CT of the abdomen and pelvis during bolus administration of intravenous contrast. Negative oral contrast VoLumen was given. CONTRAST:  100 cc of Isovue-300  COMPARISON:  07/13/2015 FINDINGS: Lower chest: Minimal motion degradation at the lung bases. Calcified granuloma at the anterior right lung base. Mild cardiomegaly, without pericardial or pleural effusion. Hepatobiliary: Focal steatosis adjacent the falciform ligament. No suspicious liver lesion. Cholecystectomy, without biliary ductal dilatation. Pancreas: Normal, without mass or ductal dilatation. Spleen: Normal in size, without focal abnormality. Adrenals/Urinary Tract: Mild thickening of the lateral limb left adrenal gland. Anterior interpolar left renal lesion measures 2.3 cm and 19 HU on image 42/series 2. More posterior interpolar left renal lesion measures fluid density and 12 mm, likely a cyst. Too small to characterize lesions in the right kidney. No hydronephrosis. Normal urinary bladder. Stomach/Bowel: Normal stomach, without wall thickening. Scattered colonic diverticula. Wall thickening involving the sigmoid is likely related to muscular hypertrophy. Normal appendix. The extent of small bowel distension with neutral contrast is poor to moderate. Moderate wall thickening involving the terminal ileum, including on image 147/series 3. Felt to be similar. The distal ileum demonstrates persistent, moderate inflammation, including on image 161/ series 3. Wall thickening. Mild pericolonic edema.Focal extraluminal outpouching cephalad including at 10 mm on image 157/series 3. No free extraluminal gas. More proximal small bowel loops are within normal limits, given suboptimal distention. Vascular/Lymphatic: Aortic and branch vessel atherosclerosis. Infrarenal abdominal aortic ectasia 3.4 cm. 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 Left common iliac artery ectasia 1.7 cm. 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 No abdominopelvic adenopathy. Upper normal sized ileocolic mesenteric nodes including at 8 mm on image  58/series 2. Reproductive: Hysterectomy.  No adnexal mass. Other: No significant free fluid.  Mild pelvic floor laxity. Musculoskeletal: L4-S1 trans pedicle screw fixation. IMPRESSION: 1. Suboptimal small bowel distension with neutral contrast. 2. Similar distal and less so terminal ileal wall thickening with surrounding inflammation. Suspicious for Crohn disease. Infectious enteritis could look similar. Extension of inflammation about the cephalad wall of the distal ileum is favored to represent penetrating/transmural disease. No evidence of surrounding abscess or fistula. 3. Left renal lesion which is most likely a complex cyst. Consider nonemergent outpatient ultrasound evaluation which will allow ongoing size surveillance. 4. 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 Recommend followup by ultrasound in 3 years. This recommendation follows ACR consensus guidelines: White Paper of the ACR Incidental Findings Committee II on Vascular Findings. J Am Coll Radiol 2013; 10:789-794. Electronically Signed   By: Abigail Miyamoto M.D.   On: 07/20/2015 08:58    Medications: I have reviewed the patient's current medications.  Assessment/Plan: 1. Ileitis. CT does not show clear abscess and does suggest Crohn's disease. Patient has minimal diarrhea and is not lost a significant amount of weight. She's clearly improving with antibiotics. At this point, I don't feel that she needs urgent operation, but could be followed as an outpatient on oral antibiotics. Would consider switching her to Cipro on Flagyl and see how she tolerates that. She could go home on this medication tolerated and follow-up with Dr. Paulita Fujita. Assuming she continues to improve she will likely need colonoscopy to document Crohn's disease and probable repeat CT as well. I will go ahead and try her own full liquids today.   Kristiane Morsch JR,Brion Hedges L 07/20/2015, 10:50 AM  This note was created using voice recognition software. Minor  errors may Have occurred unintentionally.  Pager: [phone] If no answer or after hours call [phone]

## 2015-07-21 DIAGNOSIS — R935 Abnormal findings on diagnostic imaging of other abdominal regions, including retroperitoneum: Secondary | ICD-10-CM

## 2015-07-21 DIAGNOSIS — K529 Noninfective gastroenteritis and colitis, unspecified: Secondary | ICD-10-CM

## 2015-07-21 MED ORDER — OXYCODONE-ACETAMINOPHEN 5-325 MG PO TABS: 1.0000 | ORAL_TABLET | ORAL | Status: DC | PRN

## 2015-07-21 MED ORDER — CIPROFLOXACIN HCL 500 MG PO TABS
500.0000 mg | ORAL_TABLET | Freq: Two times a day (BID) | ORAL | Status: DC
  Administered 2015-07-21: 500 mg via ORAL
  Filled 2015-07-21: qty 1

## 2015-07-21 MED ORDER — IOPAMIDOL (ISOVUE-300) INJECTION 61%
100.0000 mL | Freq: Once | INTRAVENOUS | Status: AC | PRN
  Administered 2015-07-19: 100 mL via INTRAVENOUS

## 2015-07-21 MED ORDER — FAMOTIDINE 20 MG PO TABS: 20.0000 mg | ORAL_TABLET | Freq: Two times a day (BID) | ORAL | Status: DC | PRN

## 2015-07-21 MED ORDER — METRONIDAZOLE 500 MG PO TABS
500.0000 mg | ORAL_TABLET | Freq: Three times a day (TID) | ORAL | Status: DC
Start: 2015-07-21 — End: 2015-07-21

## 2015-07-21 MED ORDER — CIPROFLOXACIN HCL 500 MG PO TABS: 500.0000 mg | ORAL_TABLET | Freq: Two times a day (BID) | ORAL | Status: DC

## 2015-07-21 MED ORDER — METRONIDAZOLE 500 MG PO TABS: 500.0000 mg | ORAL_TABLET | Freq: Three times a day (TID) | ORAL | Status: DC

## 2015-07-21 NOTE — Discharge Instructions (Signed)
°  Colitis °Colitis is inflammation of the colon. Colitis may last a short time (acute) or it may last a long time (chronic). °CAUSES °This condition may be caused by: °· Viruses. °· Bacteria. °· Reactions to medicine. °· Certain autoimmune diseases, such as Crohn disease or ulcerative colitis. °SYMPTOMS °Symptoms of this condition include: °· Diarrhea. °· Passing bloody or tarry stool. °· Pain. °· Fever. °· Vomiting. °· Tiredness (fatigue). °· Weight loss. °· Bloating. °· Sudden increase in abdominal pain. °· Having fewer bowel movements than usual. °DIAGNOSIS °This condition is diagnosed with a stool test or a blood test. You may also have other tests, including X-rays, a CT scan, or a colonoscopy. °TREATMENT °Treatment may include: °· Resting the bowel. This involves not eating or drinking for a period of time. °· Fluids that are given through an IV tube. °· Medicine for pain and diarrhea. °· Antibiotic medicines. °· Cortisone medicines. °· Surgery. °HOME CARE INSTRUCTIONS °Eating and Drinking °· Follow instructions from your health care provider about eating or drinking restrictions. °· Drink enough fluid to keep your urine clear or pale yellow. °· Work with a dietitian to determine which foods cause your condition to flare up. °· Avoid foods that cause flare-ups. °· Eat a well-balanced diet. °Medicines °· Take over-the-counter and prescription medicines only as told by your health care provider. °· If you were prescribed an antibiotic medicine, take it as told by your health care provider. Do not stop taking the antibiotic even if you start to feel better. °General Instructions °· Keep all follow-up visits as told by your health care provider. This is important. °SEEK MEDICAL CARE IF: °· Your symptoms do not go away. °· You develop new symptoms. °SEEK IMMEDIATE MEDICAL CARE IF: °· You have a fever that does not go away with treatment. °· You develop chills. °· You have extreme weakness, fainting, or  dehydration. °· You have repeated vomiting. °· You develop severe pain in your abdomen. °· You pass bloody or tarry stool. °  °This information is not intended to replace advice given to you by your health care provider. Make sure you discuss any questions you have with your health care provider. °  °Document Released: 03/29/2004 Document Revised: 11/10/2014 Document Reviewed: 06/14/2014 °Elsevier Interactive Patient Education ©2016 [employer] ° °

## 2015-07-21 NOTE — Progress Notes (Signed)
Discussed discharge summary with patient. Reviewed all medications with patient. Patient received Rx. Patient ready for discharge. 

## 2015-07-21 NOTE — Progress Notes (Signed)
[facility] PROGRESS NOTE Subjective Doing well on full liquids.  Objective: Vital signs in last 24 hours: Temp:  [98 F (36.7 C)-98.4 F (36.9 C)] 98.4 F (36.9 C) (05/18 0616) Pulse Rate:  [63-71] 64 (05/18 0616) Resp:  [17-19] 17 (05/18 0616) BP: (124-133)/(63-81) 133/81 mmHg (05/18 0616) SpO2:  [94 %-100 %] 100 % (05/18 0616) Last BM Date: 07/20/15  Intake/Output from previous day: 05/17 0701 - 05/18 0700 In: -  Out: 800 [Urine:800] Intake/Output this shift:    PE: General--NAD  Abdomen--nontnder  Lab Results:  Recent Labs  07/19/15 0642 07/20/15 0520  WBC 7.4 7.5  HGB 12.8 12.9  HCT 39.8 40.0  PLT 219 240   BMET  Recent Labs  07/19/15 0642 07/20/15 0520  NA 142 141  K 3.7 3.7  CL 106 106  CO2 25 26  CREATININE 1.14* 1.16*   LFT No results for input(s): PROT, AST, ALT, ALKPHOS, BILITOT, BILIDIR, IBILI in the last 72 hours. PT/INR No results for input(s): LABPROT, INR in the last 72 hours. PANCREAS No results for input(s): LIPASE in the last 72 hours.       Studies/Results: Ct Entero Abd/pelvis W/cm  07/20/2015  CLINICAL DATA:  Followup of abnormal CT, demonstrating distal ileal possible perforation inflammation. EXAM: CT ABDOMEN AND PELVIS WITH CONTRAST (ENTEROGRAPHY) TECHNIQUE: Multidetector CT of the abdomen and pelvis during bolus administration of intravenous contrast. Negative oral contrast VoLumen was given. CONTRAST:  100 cc of Isovue-300 COMPARISON:  07/13/2015 FINDINGS: Lower chest: Minimal motion degradation at the lung bases. Calcified granuloma at the anterior right lung base. Mild cardiomegaly, without pericardial or pleural effusion. Hepatobiliary: Focal steatosis adjacent the falciform ligament. No suspicious liver lesion. Cholecystectomy, without biliary ductal dilatation. Pancreas: Normal, without mass or ductal dilatation. Spleen: Normal in size, without focal abnormality. Adrenals/Urinary Tract: Mild thickening of the  lateral limb left adrenal gland. Anterior interpolar left renal lesion measures 2.3 cm and 19 HU on image 42/series 2. More posterior interpolar left renal lesion measures fluid density and 12 mm, likely a cyst. Too small to characterize lesions in the right kidney. No hydronephrosis. Normal urinary bladder. Stomach/Bowel: Normal stomach, without wall thickening. Scattered colonic diverticula. Wall thickening involving the sigmoid is likely related to muscular hypertrophy. Normal appendix. The extent of small bowel distension with neutral contrast is poor to moderate. Moderate wall thickening involving the terminal ileum, including on image 147/series 3. Felt to be similar. The distal ileum demonstrates persistent, moderate inflammation, including on image 161/ series 3. Wall thickening. Mild pericolonic edema.Focal extraluminal outpouching cephalad including at 10 mm on image 157/series 3. No free extraluminal gas. More proximal small bowel loops are within normal limits, given suboptimal distention. Vascular/Lymphatic: Aortic and branch vessel atherosclerosis. Infrarenal abdominal aortic ectasia 3.4 cm. 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 Left common iliac artery ectasia 1.7 cm. 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 No abdominopelvic adenopathy. Upper normal sized ileocolic mesenteric nodes including at 8 mm on image 58/series 2. Reproductive: Hysterectomy.  No adnexal mass. Other: No significant free fluid.  Mild pelvic floor laxity. Musculoskeletal: L4-S1 trans pedicle screw fixation. IMPRESSION: 1. Suboptimal small bowel distension with neutral contrast. 2. Similar distal and less so terminal ileal wall thickening with surrounding inflammation. Suspicious for Crohn disease. Infectious enteritis could look similar. Extension of inflammation about the cephalad wall of the distal ileum is favored to represent  penetrating/transmural disease. No  evidence of surrounding abscess or fistula. 3. Left renal lesion which is most likely a complex cyst. Consider nonemergent outpatient ultrasound evaluation which will allow ongoing size surveillance. 4. 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 Recommend followup by ultrasound in 3 years. This recommendation follows ACR consensus guidelines: White Paper of the ACR Incidental Findings Committee II on Vascular Findings. J Am Coll Radiol 2013; 10:789-794. Electronically Signed   By: Abigail Miyamoto M.D.   On: 07/20/2015 08:58    Medications: I have reviewed the patient's current medications.  Assessment/Plan: 1. Ileitis Probably Crohns. Should be ok to discharge home on Cirpro and flagyl x 2 weeeks and f/u with Dr Paulita Fujita to arrange colonoscopy.   Kathy Herrera,Kathy Herrera 07/21/2015, 9:37 AM  This note was created using voice recognition software. Minor errors may Have occurred unintentionally.  Pager: [phone] If no answer or after hours call [phone]

## 2015-07-21 NOTE — Discharge Summary (Signed)
Physician Discharge Summary  Kathy Herrera DOB: June 15, 1960 DOA: 07/13/2015  PCP: Arnette Norris, MD  Admit date: 07/13/2015 Discharge date: 07/21/2015  Recommendations for Outpatient Follow-up:  1. Pt will need to follow up with PCP in 2-3 weeks post discharge 2. Please obtain BMP to evaluate electrolytes and kidney function 3. Please also check CBC to evaluate Hg and Hct levels 4. Per Gi team, pt OK to go home, must complete ABX treatment with Cipro and Flagyl 5. Diet discussed in detail with pt   Discharge Diagnoses:  Principal Problem:   Perforated viscus Active Problems:   Hypertension  Discharge Condition: Stable  Diet recommendation: Full liquid, avoiding roughage type of food    Brief Narrative:  55 year old female presented to [facility] for evaluation of right-sided flank pain for several days. CT scan notable for a markedly abnormal segment of distal ileum 10 cm proximal to the ileocecal valve this had a focal contained perforation and intense inflammation around this area. Pt started on Cipro and Flagyl in ED, coverage broadened to Zosyn. Surgery and GI team consulted.   Assessment & Plan:  Principal Problem:  ? Perforated viscus, distal ileum - no indications for surgical intervention - etiology remains unclear, ? Inflammatory bowel disease, perforated neoplasm (thought to be less likely)  - appreciate surgery and GI team following - continue Cipro and Flagyl upon discharge  - keep on clear liquids  - CT enterography abd/pelvis done, no acute findings   Active Problems:  Hypertension, essential - resume home regimen    Acute kidney injury - secondary to the above, pre renal etiology - improving but remains 1.1 - 1.2 since admission    Hypokalemia  - supplemented    Diarrhea - improving, C. Diff by PCR negative   Morbid obesity - Body mass index is 40.71 kg/(m^2)  DVT prophylaxis: SCD's, Lovenox SQ Code Status: Full  Family  Communication: Patient at bedside  Disposition Plan: Home   Consultants:   Surgery  GI  Procedures:   None  Discharge Exam: Filed Vitals:   07/20/15 2150 07/21/15 0616  BP: 133/73 133/81  Pulse: 71 64  Temp: 98.2 F (36.8 C) 98.4 F (36.9 C)  Resp: 18 17   Filed Vitals:   07/20/15 0516 07/20/15 1557 07/20/15 2150 07/21/15 0616  BP: 146/80 124/63 133/73 133/81  Pulse: 65 63 71 64  Temp: 98.4 F (36.9 C) 98 F (36.7 C) 98.2 F (36.8 C) 98.4 F (36.9 C)  TempSrc: Oral Oral Oral   Resp:  19 18 17   Height:      Weight:      SpO2: 99% 98% 94% 100%    General: Pt is alert, follows commands appropriately, not in acute distress Cardiovascular: Regular rate and rhythm, S1/S2 +, no murmurs, no rubs, no gallops Respiratory: Clear to auscultation bilaterally, no wheezing, no crackles, no rhonchi Abdominal: Soft, non tender, non distended, bowel sounds +, no guarding Extremities: no edema, no cyanosis, pulses palpable bilaterally DP and PT Neuro: Grossly nonfocal  Discharge Instructions  Discharge Instructions    Diet - low sodium heart healthy    Complete by:  As directed      Increase activity slowly    Complete by:  As directed             Medication List    STOP taking these medications        diclofenac 50 MG EC tablet  Commonly known as:  VOLTAREN  TAKE these medications        aspirin EC 81 MG tablet  Take 81 mg by mouth daily.     atenolol 100 MG tablet  Commonly known as:  TENORMIN  Take 1 tablet (100 mg total) by mouth daily.     ciprofloxacin 500 MG tablet  Commonly known as:  CIPRO  Take 1 tablet (500 mg total) by mouth 2 (two) times daily.     cloNIDine 0.1 MG tablet  Commonly known as:  CATAPRES  Take 1 tablet (0.1 mg total) by mouth daily.     famotidine 20 MG tablet  Commonly known as:  PEPCID  Take 1 tablet (20 mg total) by mouth 2 (two) times daily as needed for heartburn.     hydrochlorothiazide 25 MG tablet  Commonly  known as:  HYDRODIURIL  Take 1 tablet (25 mg total) by mouth daily.     lisinopril 40 MG tablet  Commonly known as:  PRINIVIL,ZESTRIL  Take 1 tablet (40 mg total) by mouth daily.     metroNIDAZOLE 500 MG tablet  Commonly known as:  FLAGYL  Take 1 tablet (500 mg total) by mouth every 8 (eight) hours.     oxyCODONE-acetaminophen 5-325 MG tablet  Commonly known as:  PERCOCET/ROXICET  Take 1-2 tablets by mouth every 3 (three) hours as needed for moderate pain.     potassium chloride 10 MEQ tablet  Commonly known as:  K-DUR,KLOR-CON  Take 1 tablet (10 mEq total) by mouth daily.           Follow-up Information    Follow up with Arnette Norris, MD.   Specialty:  Family Medicine   Contact information:   [address] [address] [phone]       Call Faye Ramsay, MD.   Specialty:  Internal Medicine   Why:  As needed call my cell phone [phone]   Contact information:   [address] [place] [address] [phone]        The results of significant diagnostics from this hospitalization (including imaging, microbiology, ancillary and laboratory) are listed below for reference.     Microbiology: Recent Results (from the past 240 hour(s))  Blood culture (routine x 2)     Status: None   Collection Time: 07/14/15  2:20 AM  Result Value Ref Range Status   Specimen Description BLOOD RIGHT ARM  Final   Special Requests BOTTLES DRAWN AEROBIC ONLY 10ML  Final   Culture NO GROWTH 5 DAYS  Final   Report Status 07/19/2015 FINAL  Final  Blood culture (routine x 2)     Status: None   Collection Time: 07/14/15  2:25 AM  Result Value Ref Range Status   Specimen Description BLOOD RIGHT HAND  Final   Special Requests IN PEDIATRIC BOTTLE 3ML  Final   Culture NO GROWTH 5 DAYS  Final   Report Status 07/19/2015 FINAL  Final  C difficile quick scan w PCR reflex     Status: None   Collection Time: 07/18/15  1:57 PM  Result Value Ref Range Status    C Diff antigen NEGATIVE NEGATIVE Final   C Diff toxin NEGATIVE NEGATIVE Final   C Diff interpretation Negative for toxigenic C. difficile  Final     Labs: Basic Metabolic Panel:  Recent Labs Lab 07/16/15 0442 07/17/15 0645 07/18/15 0636 07/19/15 0642 07/20/15 0520  NA 142 141 139 142 141  K 3.3* 3.6 3.9 3.7 3.7  CL  105 107 109 106 106  CO2 26 23 22 25 26   GLUCOSE 85 135* 103* 87 84  BUN 8 <5* <5* <5* <5*  CREATININE 1.18* 1.23* 1.03* 1.14* 1.16*  CALCIUM 9.2 9.2 8.8* 9.3 9.1   CBC:  Recent Labs Lab 07/16/15 0442 07/17/15 0645 07/18/15 0636 07/19/15 0642 07/20/15 0520  WBC 9.4 9.0 8.9 7.4 7.5  HGB 13.3 13.5 13.2 12.8 12.9  HCT 40.7 42.6 40.9 39.8 40.0  MCV 88.5 88.8 88.0 87.5 87.0  PLT 224 206 198 219 240   CBG:  Recent Labs Lab 07/17/15 1202 07/17/15 1701 07/17/15 2356 07/18/15 0544 07/18/15 1218  GLUCAP 87 99 85 88 92   SIGNED: Time coordinating discharge: 30 minutes  MAGICK-Tiffane Sheldon, MD  [facility] 07/21/2015, 10:41 AM Pager [phone]  If 7PM-7AM, please contact night-coverage [email or web address] Password [email or web address]

## 2015-08-02 ENCOUNTER — Encounter: Payer: Self-pay | Admitting: Endocrinology

## 2015-08-02 ENCOUNTER — Ambulatory Visit (INDEPENDENT_AMBULATORY_CARE_PROVIDER_SITE_OTHER): Payer: Commercial Managed Care - HMO | Admitting: Endocrinology

## 2015-08-02 VITALS — BP 144/92 | HR 91 | Temp 98.2°F | Ht 67.0 in | Wt 260.0 lb

## 2015-08-02 DIAGNOSIS — E0590 Thyrotoxicosis, unspecified without thyrotoxic crisis or storm: Secondary | ICD-10-CM | POA: Diagnosis not present

## 2015-08-02 LAB — T4, FREE: Free T4: 0.75 ng/dL (ref 0.60–1.60)

## 2015-08-02 LAB — TSH: TSH: 0.2 u[IU]/mL — ABNORMAL LOW (ref 0.35–4.50)

## 2015-08-02 MED ORDER — METHIMAZOLE 5 MG PO TABS: 5.0000 mg | ORAL_TABLET | ORAL | Status: DC

## 2015-08-02 NOTE — Patient Instructions (Signed)
blood tests are requested for you today.  We'll let you know about the results.  At some point, you will need to resume the methimazole, at a lower amount.   Please come back for a follow-up appointment in 3 months.
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
## 2015-08-03 ENCOUNTER — Encounter: Payer: Self-pay | Admitting: Family Medicine

## 2015-08-03 ENCOUNTER — Ambulatory Visit (INDEPENDENT_AMBULATORY_CARE_PROVIDER_SITE_OTHER): Payer: Commercial Managed Care - HMO | Admitting: Family Medicine

## 2015-08-03 VITALS — BP 134/70 | HR 62 | Temp 97.9°F | Wt 260.5 lb

## 2015-08-03 DIAGNOSIS — R69 Illness, unspecified: Secondary | ICD-10-CM

## 2015-08-03 DIAGNOSIS — K529 Noninfective gastroenteritis and colitis, unspecified: Secondary | ICD-10-CM | POA: Diagnosis not present

## 2015-08-03 DIAGNOSIS — N289 Disorder of kidney and ureter, unspecified: Secondary | ICD-10-CM | POA: Diagnosis not present

## 2015-08-03 DIAGNOSIS — R198 Other specified symptoms and signs involving the digestive system and abdomen: Secondary | ICD-10-CM

## 2015-08-03 LAB — CBC WITH DIFFERENTIAL/PLATELET
Basophils Absolute: 0.1 10*3/uL (ref 0.0–0.1)
Basophils Relative: 0.4 % (ref 0.0–3.0)
Eosinophils Absolute: 0.4 10*3/uL (ref 0.0–0.7)
Eosinophils Relative: 3.4 % (ref 0.0–5.0)
HCT: 43.9 % (ref 36.0–46.0)
Hemoglobin: 14.2 g/dL (ref 12.0–15.0)
Lymphocytes Relative: 30.8 % (ref 12.0–46.0)
Lymphs Abs: 4 10*3/uL (ref 0.7–4.0)
MCHC: 32.3 g/dL (ref 30.0–36.0)
MCV: 87.7 fl (ref 78.0–100.0)
Monocytes Absolute: 1 10*3/uL (ref 0.1–1.0)
Monocytes Relative: 7.6 % (ref 3.0–12.0)
Neutro Abs: 7.4 10*3/uL (ref 1.4–7.7)
Neutrophils Relative %: 57.8 % (ref 43.0–77.0)
Platelets: 226 10*3/uL (ref 150.0–400.0)
RBC: 5.01 Mil/uL (ref 3.87–5.11)
RDW: 16.2 % — ABNORMAL HIGH (ref 11.5–15.5)
WBC: 12.9 10*3/uL — ABNORMAL HIGH (ref 4.0–10.5)

## 2015-08-03 LAB — BASIC METABOLIC PANEL
BUN: 12 mg/dL (ref 6–23)
CO2: 25 mEq/L (ref 19–32)
Calcium: 9.3 mg/dL (ref 8.4–10.5)
Chloride: 109 mEq/L (ref 96–112)
Creatinine, Ser: 1.14 mg/dL (ref 0.40–1.20)
GFR: 63.34 mL/min (ref >60.00)
Glucose, Bld: 76 mg/dL (ref 70–99)
Potassium: 3.9 mEq/L (ref 3.5–5.1)
Sodium: 137 mEq/L (ref 135–145)

## 2015-08-03 MED ORDER — OXYCODONE-ACETAMINOPHEN 5-325 MG PO TABS: 1.0000 | ORAL_TABLET | ORAL | Status: DC | PRN

## 2015-08-03 NOTE — Assessment & Plan Note (Signed)
Recheck BMET today 
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
## 2015-08-03 NOTE — Progress Notes (Signed)
Pre visit review using our clinic review tool, if applicable. No additional management support is needed unless otherwise documented below in the visit note. 

## 2015-08-03 NOTE — Assessment & Plan Note (Signed)
Finish course of abx. Keep appt with GI.

## 2015-08-03 NOTE — Addendum Note (Signed)
Addended by: Lucille Passy on: 08/03/2015 10:17 AM   Modules accepted: Orders

## 2015-08-03 NOTE — Patient Instructions (Signed)
Good to see you. We will call you with your results. Please finish your antibiotics.  Keep your appointment with Dr. Paulita Fujita.

## 2015-08-03 NOTE — Assessment & Plan Note (Signed)
Has appointment with GI, Dr. Paulita Fujita, in July. Finish course of abx.

## 2015-08-04 ENCOUNTER — Other Ambulatory Visit: Payer: Self-pay | Admitting: *Deleted

## 2015-08-04 MED ORDER — OXYCODONE-ACETAMINOPHEN 5-325 MG PO TABS: 1.0000 | ORAL_TABLET | ORAL | Status: DC | PRN

## 2015-08-04 NOTE — Telephone Encounter (Signed)
Pharmacy contacted office and states that percocet rx is written for 1tab poq3H, and due to the tylenol content, she can only have every 4 hours with quantity of #60. Spoke to Dr Deborra Medina via phone and discussed;ok to change in her absence and Dory Larsen has agreed to sign for pt. Pt advised to pick up old Rx from pharmacy and bring back to office to TRADE for new one. Pt must have old Rx in order to receive new one.

## 2015-08-15 ENCOUNTER — Other Ambulatory Visit: Payer: Self-pay

## 2015-08-15 DIAGNOSIS — I10 Essential (primary) hypertension: Secondary | ICD-10-CM

## 2015-08-15 NOTE — Patient Outreach (Signed)
[facility]) Care Management  08/15/2015  [place] Jul 20, 1960 [id number]    Telephone Screen  Referral Date: 08/11/15 Referral Source: [employer] Referral Reason: "disease and symptom management, gastroenteritis and colitis"    Outreach attempt #1 to patient. Patient reached. Screening completed.  Social: Patient resides in her home along with spouse. She states that she is independent with ADLs/IADLs. She drives herself to medical appts. She does voice a "near fall" where she "twisted hip" just a few days ago. She reports she feels like she pulled a muscle as area has been sore. She did not go get checked out. Patient has a cane in the home and uses it.  Conditions: Patient has h/o: DJD, heart murmur, HTN, HLD,GERD, hyperthyroidism. She states that she is continuing to get work up to determine if she has Crohns disease. Appetite remains fair. She reports intermittent periods of pain but its controlled at present. She was recently hospitalized from 5/10-5/18 for right flank pain. She does report that she goes next month for further testing and workup to determine diagnosis. She has been educated on diet and voices trying to adhere to restrictions.  Appointments: She had f/u PCP appt on 08/03/15. She goes to see GI MD(Dr. Paulita Fujita) in July. She is also followed by endocrinologist and has appt in two months.   Consent: Patient gave verbal consent for [facility] services.  Plan: RN CM will notify [facility] administrative assistant of case status. RN CM will send [facility] community referral for TOC case.    Enzo Montgomery, RN,BSN,CCM [place] Management Telephonic Care Management Coordinator Direct Phone: [phone] Toll Free: [phone] Fax: [phone]

## 2015-08-16 ENCOUNTER — Other Ambulatory Visit: Payer: Self-pay

## 2015-08-16 NOTE — Patient Outreach (Signed)
Successful telephone contact made with patient for community care coordination. Patient identified herself by providing date of birth and address. Introductions made, purpose of call stated and brief description of [facility] Program offered.  Patient as agreeable to this brief telephone assessment and to schedule home visit for next week.  Plan: Home visit next week for community care coordination and assessment of educational needs for management of chronic illness.

## 2015-08-25 ENCOUNTER — Other Ambulatory Visit: Payer: Self-pay

## 2015-08-25 NOTE — Patient Outreach (Signed)
[facility] Care Management  08/25/2015  [place] 1961-02-22 [id number]    Initial home visit for assessment of community care  Coordination needs. Met with patient in her home in the presence of her husband whom patient gave permissionn to be present during his visit. Patient was was agreeable to this initial assessment.  Patient is disabled, moved here several years ago from [place]. Patient states she lived in [place] for 30 years where she met her husband.   [facility] Information Packet was reviewed with patient, written consent obtained.    Findings during this home visit:  R/O Chrons Disease-has appointment with Dr. Paulita Fujita [address], [place].  Patient has appoitnment scheduled for mid July for further testing.   Medications-Patient reports she gets her medications through mail order at little or no cost.  Patient has a sound knowledge base related to her medications, was able to demonstrate how she administers her medications.  Patient also has all her medications.   Transportation-Patient has transportation, a Publishing rights manager.  She is also able to drive.  Patient lives with her husband, Glenard Haring and has an adult son who also assist if needed in providing transportation.   Patient is independent with ADLs and  IADLs   Patient states she and her husband are able to afford medical copays as long as the copays do not get any higher.    Cigarette Use:  Patient states she is an everyday cigarette smoker.  Patinet reports smoking about a pack per day.    Patient and this RNCM collaborated to form her case management goals  Plan: Telephone contact in July 2017 for assessment of progression in meeting case management goals.

## 2015-09-09 ENCOUNTER — Other Ambulatory Visit: Payer: Self-pay

## 2015-09-09 NOTE — Patient Outreach (Signed)
Unsuccessful attempt made to contact patient for community care coordination. Will attempt to make contact with patient via telephone next week.

## 2015-09-12 ENCOUNTER — Other Ambulatory Visit: Payer: Self-pay

## 2015-09-12 NOTE — Patient Outreach (Signed)
RNCM was successful in making contact with patient who identiifeid herself by providng date of birth and address. Patient stated she was attempt to stay cool. Patient also stated she was staying hydrated by drinking sufficient amounts of water.    Patient reports having an appointment scheduled for later this week for GI evaluation, at that time, she hopes to have some definitive information regarding her GI status.  Patient denies any discomfort at present.    Patient and RNCM reviewed her case management care plan and updated accordingly. Plan'  Plan: Telephone contact later this month for assessment of community care coordination needs

## 2015-09-13 ENCOUNTER — Other Ambulatory Visit: Payer: Self-pay | Admitting: Gastroenterology

## 2015-09-13 DIAGNOSIS — R1084 Generalized abdominal pain: Secondary | ICD-10-CM

## 2015-09-13 DIAGNOSIS — R935 Abnormal findings on diagnostic imaging of other abdominal regions, including retroperitoneum: Secondary | ICD-10-CM | POA: Diagnosis not present

## 2015-09-13 DIAGNOSIS — R109 Unspecified abdominal pain: Secondary | ICD-10-CM | POA: Diagnosis not present

## 2015-09-14 ENCOUNTER — Encounter: Payer: Self-pay | Admitting: Family Medicine

## 2015-09-14 ENCOUNTER — Other Ambulatory Visit: Payer: Self-pay | Admitting: Family Medicine

## 2015-09-14 MED ORDER — HYDROCHLOROTHIAZIDE 25 MG PO TABS: 25.0000 mg | ORAL_TABLET | Freq: Every day | ORAL | Status: DC

## 2015-09-14 MED ORDER — ATENOLOL 100 MG PO TABS: 100.0000 mg | ORAL_TABLET | Freq: Every day | ORAL | Status: DC

## 2015-09-14 MED ORDER — LISINOPRIL 40 MG PO TABS: 40.0000 mg | ORAL_TABLET | Freq: Every day | ORAL | Status: DC

## 2015-09-14 MED ORDER — POTASSIUM CHLORIDE CRYS ER 10 MEQ PO TBCR: 10.0000 meq | EXTENDED_RELEASE_TABLET | Freq: Every day | ORAL | Status: DC

## 2015-09-16 ENCOUNTER — Other Ambulatory Visit: Payer: Self-pay | Admitting: Gastroenterology

## 2015-09-16 ENCOUNTER — Ambulatory Visit
Admission: RE | Admit: 2015-09-16 | Discharge: 2015-09-16 | Disposition: A | Discharge status: Home / Self Care | Payer: Commercial Managed Care - HMO | Source: Ambulatory Visit | Attending: Gastroenterology | Admitting: Gastroenterology

## 2015-09-16 DIAGNOSIS — R1084 Generalized abdominal pain: Secondary | ICD-10-CM

## 2015-09-16 DIAGNOSIS — K5900 Constipation, unspecified: Secondary | ICD-10-CM | POA: Diagnosis not present

## 2015-10-05 ENCOUNTER — Other Ambulatory Visit: Payer: Self-pay

## 2015-10-05 NOTE — Patient Outreach (Signed)
[facility]) Care Management  [place]  10/05/2015   LATE ENTRY [place] 03-31-1960 [id number]  Subjective:  I am doing good. I think everything is fine  Objective:    Encounter Medications:  Outpatient Encounter Prescriptions as of 10/05/2015  Medication Sig  . aspirin EC 81 MG tablet Take 81 mg by mouth daily.  Marland Kitchen atenolol (TENORMIN) 100 MG tablet Take 1 tablet (100 mg total) by mouth daily.  . ciprofloxacin (CIPRO) 500 MG tablet Take 1 tablet (500 mg total) by mouth 2 (two) times daily. (Patient not taking: Reported on 08/25/2015)  . cloNIDine (CATAPRES) 0.1 MG tablet TAKE 1 TABLET EVERY DAY  . famotidine (PEPCID) 20 MG tablet Take 1 tablet (20 mg total) by mouth 2 (two) times daily as needed for heartburn.  . hydrochlorothiazide (HYDRODIURIL) 25 MG tablet Take 1 tablet (25 mg total) by mouth daily.  Marland Kitchen lisinopril (PRINIVIL,ZESTRIL) 40 MG tablet Take 1 tablet (40 mg total) by mouth daily.  . methimazole (TAPAZOLE) 5 MG tablet Take 1 tablet (5 mg total) by mouth 3 (three) times a week.  . metroNIDAZOLE (FLAGYL) 500 MG tablet Take 1 tablet (500 mg total) by mouth every 8 (eight) hours. (Patient not taking: Reported on 08/25/2015)  . oxyCODONE-acetaminophen (PERCOCET/ROXICET) 5-325 MG tablet Take 1-2 tablets by mouth every 4 (four) hours as needed for moderate pain.  . potassium chloride (K-DUR,KLOR-CON) 10 MEQ tablet Take 1 tablet (10 mEq total) by mouth daily.   No facility-administered encounter medications on file as of 10/05/2015.     Functional Status:  In your present state of health, do you have any difficulty performing the following activities: 08/25/2015 07/14/2015  Hearing? N N  Vision? Y N  Difficulty concentrating or making decisions? N N  Walking or climbing stairs? Y Y  Dressing or bathing? N N  Doing errands, shopping? [place]pie Donning  Preparing Food and eating ? N -  Using the Toilet? N -  In the past six months, have you accidently  leaked urine? N -  Do you have problems with loss of bowel control? N -  Managing your Medications? N -  Managing your Finances? N -  Housekeeping or managing your Housekeeping? N -  Some recent data might be hidden    Fall/Depression Screening:  PHQ 2/9 Scores 08/25/2015 08/15/2015 05/11/2015  PHQ - 2 Score 0 0 0   Fall Risk  08/25/2015 08/15/2015 05/11/2015  Falls in the past year? No Yes No  Number falls in past yr: - 1 -  Injury with Fall? - No -  Risk for fall due to : Impaired balance/gait;Impaired mobility;Impaired vision;Medication side effect History of fall(s);Impaired balance/gait -  Follow up Follow up appointment Education provided -   Assessment:  Patient and RNCM reviewed her care management goals. Patient has met all case mangement goals  Plan:  Discharge from caseload as patient has met all her case management care  Goals.

## 2015-11-02 ENCOUNTER — Ambulatory Visit (INDEPENDENT_AMBULATORY_CARE_PROVIDER_SITE_OTHER): Payer: Commercial Managed Care - HMO | Admitting: Endocrinology

## 2015-11-02 ENCOUNTER — Encounter: Payer: Self-pay | Admitting: Endocrinology

## 2015-11-02 VITALS — BP 128/82 | HR 70 | Ht 67.0 in | Wt 261.0 lb

## 2015-11-02 DIAGNOSIS — E0590 Thyrotoxicosis, unspecified without thyrotoxic crisis or storm: Secondary | ICD-10-CM | POA: Diagnosis not present

## 2015-11-02 LAB — TSH: TSH: 0.18 u[IU]/mL — ABNORMAL LOW (ref 0.35–4.50)

## 2015-11-02 MED ORDER — METHIMAZOLE 5 MG PO TABS: 5.0000 mg | ORAL_TABLET | ORAL | 4 refills | Status: DC

## 2015-11-02 NOTE — Patient Instructions (Signed)
blood tests are requested for you today.  We'll let you know about the results. If ever you have fever while taking methimazole, stop it and call us, even if the reason is obvious, because of the risk of a rare side-effect.  Please come back for a follow-up appointment in 3 months.   
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
## 2016-01-19 DIAGNOSIS — K529 Noninfective gastroenteritis and colitis, unspecified: Secondary | ICD-10-CM | POA: Diagnosis not present

## 2016-01-19 DIAGNOSIS — R933 Abnormal findings on diagnostic imaging of other parts of digestive tract: Secondary | ICD-10-CM | POA: Diagnosis not present

## 2016-01-19 DIAGNOSIS — K633 Ulcer of intestine: Secondary | ICD-10-CM | POA: Diagnosis not present

## 2016-01-19 DIAGNOSIS — D125 Benign neoplasm of sigmoid colon: Secondary | ICD-10-CM | POA: Diagnosis not present

## 2016-01-19 DIAGNOSIS — R1031 Right lower quadrant pain: Secondary | ICD-10-CM | POA: Diagnosis not present

## 2016-01-19 DIAGNOSIS — K5730 Diverticulosis of large intestine without perforation or abscess without bleeding: Secondary | ICD-10-CM | POA: Diagnosis not present

## 2016-01-19 DIAGNOSIS — K644 Residual hemorrhoidal skin tags: Secondary | ICD-10-CM | POA: Diagnosis not present

## 2016-01-19 DIAGNOSIS — D124 Benign neoplasm of descending colon: Secondary | ICD-10-CM | POA: Diagnosis not present

## 2016-01-19 DIAGNOSIS — K635 Polyp of colon: Secondary | ICD-10-CM | POA: Diagnosis not present

## 2016-01-19 DIAGNOSIS — K6389 Other specified diseases of intestine: Secondary | ICD-10-CM | POA: Diagnosis not present

## 2016-01-19 DIAGNOSIS — K648 Other hemorrhoids: Secondary | ICD-10-CM | POA: Diagnosis not present

## 2016-01-24 DIAGNOSIS — K529 Noninfective gastroenteritis and colitis, unspecified: Secondary | ICD-10-CM | POA: Diagnosis not present

## 2016-01-24 DIAGNOSIS — K635 Polyp of colon: Secondary | ICD-10-CM | POA: Diagnosis not present

## 2016-01-25 ENCOUNTER — Encounter (INDEPENDENT_AMBULATORY_CARE_PROVIDER_SITE_OTHER): Payer: Self-pay | Admitting: Specialist

## 2016-01-25 ENCOUNTER — Ambulatory Visit (INDEPENDENT_AMBULATORY_CARE_PROVIDER_SITE_OTHER): Payer: Commercial Managed Care - HMO | Admitting: Specialist

## 2016-01-25 VITALS — BP 112/79 | HR 88 | Ht 67.0 in | Wt 249.0 lb

## 2016-01-25 DIAGNOSIS — R200 Anesthesia of skin: Secondary | ICD-10-CM

## 2016-01-25 DIAGNOSIS — Z981 Arthrodesis status: Secondary | ICD-10-CM

## 2016-01-25 DIAGNOSIS — R202 Paresthesia of skin: Secondary | ICD-10-CM

## 2016-01-25 NOTE — Progress Notes (Signed)
Office Visit Note   Patient: Kathy Herrera           Date of Birth: May 21, 1960           MRN: [id number] Visit Date: 01/25/2016              Requested by: No referring provider defined for this encounter. PCP: Arnette Norris, MD   Assessment & Plan: Visit Diagnoses:  1. S/P lumbar fusion   2. Bilateral arm numbness and tingling while sleeping     Plan: Avoid frequent bending and stooping  No lifting greater than 10-15 lbs. May use ice or moist heat for pain. Weight loss is of benefit. Discuss with Dr. Loanne Drilling considering neurology evaluation of arms for complaints of numbness, cubital tunnel syndrome vs cervical disorder.   Follow-Up Instructions: Return if symptoms worsen or fail to improve.   Orders:  No orders of the defined types were placed in this encounter.  No orders of the defined types were placed in this encounter.     Procedures: No procedures performed   Clinical Data: No additional findings.   Subjective: Chief Complaint  Patient presents with  . Lower Back - Follow-up    Patient is retuning today for a 1 year recheck on her lumbar spine. Patient had Central laminectomy L4-5, L5-S1, transforaminal lumbar interbody fusion L4-5, L5-S1 rods and screw fix and L5-S1 local bone graft. She is now 2 years out from this surgery. Still having problems with walking due to the one leg being a little shorter than the other. States she is better than before, she dont have the pain radiating down leg. Has more pian in hips than in legs now. Also states her knees are about the same. Pops and cracks but states dont want any more surgery.    Review of Systems  Constitutional: Negative.   HENT: Negative.   Eyes: Positive for visual disturbance.  Respiratory: Negative.   Cardiovascular: Negative.   Gastrointestinal: Negative.   Endocrine: Negative.   Genitourinary: Negative.   Musculoskeletal: Positive for back pain and gait problem.  Skin: Negative.     Allergic/Immunologic: Negative for environmental allergies, food allergies and immunocompromised state.  Neurological: Positive for numbness.  Hematological: Negative.   Psychiatric/Behavioral: Negative.      Objective: Vital Signs: BP 112/79   Pulse 88   Ht 5\' 7"  (1.702 m)   Wt 249 lb (112.9 kg)   BMI 39.00 kg/m   Physical Exam  Constitutional: She is oriented to person, place, and time. She appears well-developed and well-nourished.  Eyes: EOM are normal. Pupils are equal, round, and reactive to light.  Neck: Normal range of motion. Neck supple.  Pulmonary/Chest: Effort normal and breath sounds normal.  Abdominal: Soft. Bowel sounds are normal.  Neurological: She is alert and oriented to person, place, and time.  Skin: Skin is warm and dry.  Psychiatric: She has a normal mood and affect. Her behavior is normal. Judgment and thought content normal.    Back Exam   Tenderness  The patient is experiencing tenderness in the lumbar.  Range of Motion  Extension: abnormal  Flexion: abnormal  Lateral Bend Right: abnormal  Lateral Bend Left: abnormal  Rotation Right: abnormal  Rotation Left: abnormal   Muscle Strength  Right Quadriceps:  5/5  Left Quadriceps:  5/5  Right Hamstrings:  5/5  Left Hamstrings:  5/5   Tests  Straight leg raise right: negative Straight leg raise left: negative  Reflexes  Patellar:  2/4 normal Achilles: 2/4 Babinski's sign: normal   Other  Toe Walk: normal Heel Walk: normal Sensation: normal Gait: normal  Erythema: no back redness Scars: absent      Specialty Comments:  No specialty comments available.  Imaging: No results found.   PMFS History: Patient Active Problem List   Diagnosis Date Noted  . Spinal stenosis, lumbar region, with neurogenic claudication 02/02/2014    Priority: High    Class: Chronic  . Spondylolisthesis of lumbar region 02/02/2014    Priority: High    Class: Chronic  . Lumbar degenerative disc  disease 02/02/2014    Priority: Medium    Class: Chronic  . Acute renal insufficiency 08/03/2015  . Abnormal x-ray of abdomen   . Enterocolitis 07/14/2015  . Perforated viscus 07/14/2015  . Hyperthyroidism 12/23/2014  . Obesity, Class III, BMI 40-49.9 (morbid obesity) ([place]) 02/11/2013  . Abnormal CBC 10/24/2012  . Tobacco abuse 09/17/2012  . Lumbar spondylosis   . Hypertension   . Hyperlipidemia    Past Medical History:  Diagnosis Date  . DJD (degenerative joint disease) of lumbar spine   . GERD (gastroesophageal reflux disease)    not everyday and uses tums when needed  . Heart murmur   . Hyperlipidemia   . Hypertension   . Perforated viscus 07/2015    Family History  Problem Relation Age of Onset  . Cancer Mother 67    cervical  . CVA Mother   . Hypertension Mother   . Thyroid disease Daughter     Past Surgical History:  Procedure Laterality Date  . ABDOMINAL HYSTERECTOMY     partial- still has cervix.  Marland Kitchen arm surgery Left   . CHOLECYSTECTOMY    . LUMBAR FUSION N/A 02/02/2014   Procedure: CENTRAL LAMINECTOMY L4-5,L5-S1, TRANSFORAMINAL LUMBAR INTERBODY FUSION L4-5,L5-S1, RODS AND SCREW FIX  AND L5-S1, LOCAL BONE GRAFT;  Surgeon: Jessy Oto, MD;  Location: [facility];  Service: Orthopedics;  Laterality: N/A;  . TONSILLECTOMY AND ADENOIDECTOMY     Social History   Occupational History  . Not on file.   Social History Main Topics  . Smoking status: Current Every Day Smoker    Packs/day: 0.50    Years: 40.00    Types: Cigarettes  . Smokeless tobacco: Never Used  . Alcohol use Yes     Comment: occassional -  holidays  . Drug use: No  . Sexual activity: Not on file

## 2016-01-25 NOTE — Patient Instructions (Signed)
Avoid frequent bending and stooping  No lifting greater than 10-15 lbs. May use ice or moist heat for pain. Weight loss is of benefit. Discuss with Dr. Loanne Drilling considering neurology evaluation of arms for complaints of numbness, cubital tunnel syndrome vs cervical disorder.

## 2016-01-30 ENCOUNTER — Encounter (INDEPENDENT_AMBULATORY_CARE_PROVIDER_SITE_OTHER): Payer: Self-pay | Admitting: Specialist

## 2016-02-02 ENCOUNTER — Encounter: Payer: Self-pay | Admitting: Endocrinology

## 2016-02-02 ENCOUNTER — Ambulatory Visit (INDEPENDENT_AMBULATORY_CARE_PROVIDER_SITE_OTHER): Payer: Commercial Managed Care - HMO | Admitting: Endocrinology

## 2016-02-02 VITALS — BP 136/92 | HR 104 | Ht 67.0 in | Wt 264.0 lb

## 2016-02-02 DIAGNOSIS — E0590 Thyrotoxicosis, unspecified without thyrotoxic crisis or storm: Secondary | ICD-10-CM

## 2016-02-02 LAB — TSH: TSH: 0.18 u[IU]/mL — ABNORMAL LOW (ref 0.35–4.50)

## 2016-02-02 LAB — T4, FREE: Free T4: 0.8 ng/dL (ref 0.60–1.60)

## 2016-02-02 MED ORDER — METHIMAZOLE 5 MG PO TABS: 5.0000 mg | ORAL_TABLET | Freq: Every day | ORAL | 4 refills | Status: DC
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
## 2016-02-02 NOTE — Progress Notes (Signed)
   Subjective:    Patient ID: Kathy Herrera, female    DOB: February 28, 1961, 55 y.o.   MRN: [id number]  HPI    Review of Systems     Objective:   Physical Exam        Assessment & Plan:

## 2016-02-02 NOTE — Patient Instructions (Signed)
blood tests are requested for you today.  We'll let you know about the results. If ever you have fever while taking methimazole, stop it and call us, even if the reason is obvious, because of the risk of a rare side-effect.  Please come back for a follow-up appointment in 3 months.   

## 2016-02-16 ENCOUNTER — Other Ambulatory Visit: Payer: Self-pay | Admitting: Gastroenterology

## 2016-02-16 ENCOUNTER — Ambulatory Visit
Admission: RE | Admit: 2016-02-16 | Discharge: 2016-02-16 | Disposition: A | Discharge status: Home / Self Care | Payer: Commercial Managed Care - HMO | Source: Ambulatory Visit | Attending: Gastroenterology | Admitting: Gastroenterology

## 2016-02-16 DIAGNOSIS — K50019 Crohn's disease of small intestine with unspecified complications: Secondary | ICD-10-CM

## 2016-02-16 DIAGNOSIS — K635 Polyp of colon: Secondary | ICD-10-CM | POA: Diagnosis not present

## 2016-02-16 DIAGNOSIS — Z87891 Personal history of nicotine dependence: Secondary | ICD-10-CM | POA: Diagnosis not present

## 2016-02-21 ENCOUNTER — Other Ambulatory Visit: Payer: Self-pay | Admitting: Family Medicine

## 2016-03-14 ENCOUNTER — Telehealth: Payer: Self-pay

## 2016-03-14 DIAGNOSIS — Z0184 Encounter for antibody response examination: Secondary | ICD-10-CM

## 2016-03-14 NOTE — Telephone Encounter (Signed)
Pt is going to be starting Humira soon for chron's and pt does not know if she has had the Hep B series or not. Does pt need to have titer or just start the Hep B series; pt also concerned about cost of injections and I advised pt if concerned could get immunizations at health dept. Pt will wait for cb.

## 2016-03-14 NOTE — Telephone Encounter (Signed)
We could certainly do titers here first.  Please order and schedule if pt prefers this.

## 2016-03-15 ENCOUNTER — Other Ambulatory Visit (INDEPENDENT_AMBULATORY_CARE_PROVIDER_SITE_OTHER): Payer: Medicare HMO

## 2016-03-15 DIAGNOSIS — Z0184 Encounter for antibody response examination: Secondary | ICD-10-CM

## 2016-03-15 LAB — HEPATITIS B SURFACE ANTIBODY, QUANTITATIVE: Hepatitis B-Post: <5 m[IU]/mL

## 2016-04-09 ENCOUNTER — Other Ambulatory Visit: Payer: Self-pay | Admitting: Endocrinology

## 2016-05-01 ENCOUNTER — Encounter: Payer: Self-pay | Admitting: Endocrinology

## 2016-05-01 ENCOUNTER — Ambulatory Visit (INDEPENDENT_AMBULATORY_CARE_PROVIDER_SITE_OTHER): Payer: Medicare HMO | Admitting: Endocrinology

## 2016-05-01 VITALS — BP 136/84 | HR 74 | Temp 98.1°F | Ht 67.0 in | Wt 268.0 lb

## 2016-05-01 DIAGNOSIS — E0590 Thyrotoxicosis, unspecified without thyrotoxic crisis or storm: Secondary | ICD-10-CM | POA: Diagnosis not present

## 2016-05-01 LAB — T4, FREE: Free T4: 0.66 ng/dL (ref 0.60–1.60)

## 2016-05-01 LAB — TSH: TSH: 4.95 u[IU]/mL — ABNORMAL HIGH (ref 0.35–4.50)

## 2016-05-01 MED ORDER — METHIMAZOLE 5 MG PO TABS: ORAL_TABLET | ORAL | 2 refills | Status: DC

## 2016-05-01 NOTE — Patient Instructions (Signed)
Thyroid blood tests are requested for you today.  We'll let you know about the results. If ever you have fever while taking methimazole, stop it and call us, even if the reason is obvious, because of the risk of a rare side-effect.  Please come back for a follow-up appointment in 3 months.  
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
## 2016-06-04 ENCOUNTER — Other Ambulatory Visit: Payer: Self-pay | Admitting: Family Medicine

## 2016-08-01 ENCOUNTER — Ambulatory Visit (INDEPENDENT_AMBULATORY_CARE_PROVIDER_SITE_OTHER): Payer: Medicare HMO | Admitting: Endocrinology

## 2016-08-01 ENCOUNTER — Encounter: Payer: Self-pay | Admitting: Endocrinology

## 2016-08-01 VITALS — BP 142/86 | HR 91 | Ht 67.0 in | Wt 269.0 lb

## 2016-08-01 DIAGNOSIS — E0590 Thyrotoxicosis, unspecified without thyrotoxic crisis or storm: Secondary | ICD-10-CM | POA: Diagnosis not present

## 2016-08-01 NOTE — Patient Instructions (Addendum)
Thyroid blood tests are requested for you today.  You can do at Dr Aron's office when you see her in 2 weeks.  We'll let you know about the results.  If ever you have fever while taking methimazole, stop it and call us, even if the reason is obvious, because of the risk of a rare side-effect.  Please come back for a follow-up appointment in 4 months.
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
## 2016-08-10 ENCOUNTER — Other Ambulatory Visit: Payer: Self-pay

## 2016-08-10 MED ORDER — ATENOLOL 100 MG PO TABS: 100.0000 mg | ORAL_TABLET | Freq: Every day | ORAL | 0 refills | Status: DC

## 2016-08-18 ENCOUNTER — Other Ambulatory Visit: Payer: Self-pay | Admitting: Family Medicine

## 2016-08-18 DIAGNOSIS — E785 Hyperlipidemia, unspecified: Secondary | ICD-10-CM

## 2016-08-18 DIAGNOSIS — Z01419 Encounter for gynecological examination (general) (routine) without abnormal findings: Secondary | ICD-10-CM | POA: Insufficient documentation

## 2016-08-21 ENCOUNTER — Other Ambulatory Visit: Payer: Self-pay | Admitting: Endocrinology

## 2016-08-21 ENCOUNTER — Other Ambulatory Visit (HOSPITAL_COMMUNITY)
Admission: RE | Admit: 2016-08-21 | Discharge: 2016-08-21 | Disposition: A | Discharge status: Home / Self Care | Payer: Medicare HMO | Source: Ambulatory Visit | Attending: Family Medicine | Admitting: Family Medicine

## 2016-08-21 ENCOUNTER — Ambulatory Visit (INDEPENDENT_AMBULATORY_CARE_PROVIDER_SITE_OTHER): Payer: Medicare HMO | Admitting: Family Medicine

## 2016-08-21 ENCOUNTER — Ambulatory Visit (INDEPENDENT_AMBULATORY_CARE_PROVIDER_SITE_OTHER): Payer: Medicare HMO

## 2016-08-21 VITALS — BP 132/82 | HR 57 | Temp 98.4°F | Ht 66.5 in | Wt 261.2 lb

## 2016-08-21 DIAGNOSIS — Z131 Encounter for screening for diabetes mellitus: Secondary | ICD-10-CM | POA: Insufficient documentation

## 2016-08-21 DIAGNOSIS — E0590 Thyrotoxicosis, unspecified without thyrotoxic crisis or storm: Secondary | ICD-10-CM | POA: Diagnosis not present

## 2016-08-21 DIAGNOSIS — Z720 Tobacco use: Secondary | ICD-10-CM

## 2016-08-21 DIAGNOSIS — Z0000 Encounter for general adult medical examination without abnormal findings: Secondary | ICD-10-CM | POA: Diagnosis not present

## 2016-08-21 DIAGNOSIS — Z01419 Encounter for gynecological examination (general) (routine) without abnormal findings: Secondary | ICD-10-CM

## 2016-08-21 DIAGNOSIS — I10 Essential (primary) hypertension: Secondary | ICD-10-CM | POA: Diagnosis not present

## 2016-08-21 DIAGNOSIS — E785 Hyperlipidemia, unspecified: Secondary | ICD-10-CM

## 2016-08-21 DIAGNOSIS — Z1239 Encounter for other screening for malignant neoplasm of breast: Secondary | ICD-10-CM

## 2016-08-21 DIAGNOSIS — Z124 Encounter for screening for malignant neoplasm of cervix: Secondary | ICD-10-CM

## 2016-08-21 LAB — CBC WITH DIFFERENTIAL/PLATELET
Basophils Absolute: 0.1 10*3/uL (ref 0.0–0.1)
Basophils Relative: 0.5 % (ref 0.0–3.0)
Eosinophils Absolute: 0.4 10*3/uL (ref 0.0–0.7)
Eosinophils Relative: 3.6 % (ref 0.0–5.0)
HCT: 48.4 % — ABNORMAL HIGH (ref 36.0–46.0)
Hemoglobin: 16.3 g/dL — ABNORMAL HIGH (ref 12.0–15.0)
Lymphocytes Relative: 42.4 % (ref 12.0–46.0)
Lymphs Abs: 4.6 10*3/uL — ABNORMAL HIGH (ref 0.7–4.0)
MCHC: 33.6 g/dL (ref 30.0–36.0)
MCV: 88 fl (ref 78.0–100.0)
Monocytes Absolute: 1.1 10*3/uL — ABNORMAL HIGH (ref 0.1–1.0)
Monocytes Relative: 10.2 % (ref 3.0–12.0)
Neutro Abs: 4.7 10*3/uL (ref 1.4–7.7)
Neutrophils Relative %: 43.3 % (ref 43.0–77.0)
Platelets: 203 10*3/uL (ref 150.0–400.0)
RBC: 5.5 Mil/uL — ABNORMAL HIGH (ref 3.87–5.11)
RDW: 14.3 % (ref 11.5–15.5)
WBC: 10.8 10*3/uL — ABNORMAL HIGH (ref 4.0–10.5)

## 2016-08-21 LAB — COMPREHENSIVE METABOLIC PANEL
ALT: 11 U/L (ref 0–35)
AST: 15 U/L (ref 0–37)
Albumin: 4 g/dL (ref 3.5–5.2)
Alkaline Phosphatase: 92 U/L (ref 39–117)
BUN: 18 mg/dL (ref 6–23)
CO2: 30 mEq/L (ref 19–32)
Calcium: 10.1 mg/dL (ref 8.4–10.5)
Chloride: 104 mEq/L (ref 96–112)
Creatinine, Ser: 1.05 mg/dL (ref 0.40–1.20)
GFR: 69.38 mL/min (ref >60.00)
Glucose, Bld: 97 mg/dL (ref 70–99)
Potassium: 3.6 mEq/L (ref 3.5–5.1)
Sodium: 140 mEq/L (ref 135–145)
Total Bilirubin: 0.4 mg/dL (ref 0.2–1.2)
Total Protein: 7.5 g/dL (ref 6.0–8.3)

## 2016-08-21 LAB — LIPID PANEL
Cholesterol: 173 mg/dL (ref 0–200)
HDL: 32.2 mg/dL — ABNORMAL LOW (ref >39.00)
LDL Cholesterol: 108 mg/dL — ABNORMAL HIGH (ref 0–99)
NonHDL: 140.34
Total CHOL/HDL Ratio: 5
Triglycerides: 161 mg/dL — ABNORMAL HIGH (ref 0.0–149.0)
VLDL: 32.2 mg/dL (ref 0.0–40.0)

## 2016-08-21 LAB — TSH: TSH: 0.05 u[IU]/mL — ABNORMAL LOW (ref 0.35–4.50)

## 2016-08-21 MED ORDER — METHIMAZOLE 5 MG PO TABS: 5.0000 mg | ORAL_TABLET | Freq: Every day | ORAL | 2 refills | Status: DC

## 2016-08-21 NOTE — Progress Notes (Signed)
Subjective:    Patient ID: Kathy Herrera, female    DOB: Jun 01, 1960, 56 y.o.   MRN: [id number]  HPI Very pleasant 56 yo female here with her husband for CPX and follow up of chronic medical conditions.  Annual wellness visit with Candis Musa, RN this morning.  Influenza vaccine 12/09/14 Remote h/o hysterectomy Colonoscopy 01/19/16 Mammogram 10/17/12- declines   HTN - HTN for over 10 years.  Now well controlled on HCTZ, Clonidine, atenolol and Lisinopril.   Denies any HA, blurred vision, CP or SOB. BP Readings from Last 3 Encounters:  08/21/16 132/82  08/21/16 132/82  08/01/16 (!) 142/86   Hyperthyroidism- currently followed by Dr. Loanne Drilling. Saw him on 07/3016. Note reviewed. Advised we check labs on her today.  Lab Results  Component Value Date   TSH 4.95 (H) 05/01/2016   Tobacco abuse- smokes cigars.  Has tried patches,chantix and gums, have not worked.  Wt Readings from Last 3 Encounters:  08/21/16 261 lb 4 oz (118.5 kg)  08/21/16 261 lb 4 oz (118.5 kg)  08/01/16 269 lb (122 kg)     Lab Results  Component Value Date   CHOL 221 (H) 05/06/2015   HDL 38.10 (L) 05/06/2015   LDLCALC 145 (H) 05/06/2015   TRIG 190.0 (H) 05/06/2015   CHOLHDL 6 05/06/2015    Patient Active Problem List   Diagnosis Date Noted  . Well woman exam 08/18/2016  . Abnormal x-ray of abdomen   . Enterocolitis 07/14/2015  . Perforated viscus 07/14/2015  . Hyperthyroidism 12/23/2014  . Spinal stenosis, lumbar region, with neurogenic claudication 02/02/2014    Class: Chronic  . Spondylolisthesis of lumbar region 02/02/2014    Class: Chronic  . Lumbar degenerative disc disease 02/02/2014    Class: Chronic  . Obesity, Class III, BMI 40-49.9 (morbid obesity) ([place]) 02/11/2013  . Tobacco abuse 09/17/2012  . Hypertension   . Hyperlipidemia    Past Medical History:  Diagnosis Date  . DJD (degenerative joint disease) of lumbar spine   . GERD (gastroesophageal reflux disease)    not everyday  and uses tums when needed  . Heart murmur   . Hyperlipidemia   . Hypertension   . Perforated viscus 07/2015   Past Surgical History:  Procedure Laterality Date  . ABDOMINAL HYSTERECTOMY     partial- still has cervix.  Marland Kitchen arm surgery Left   . CHOLECYSTECTOMY    . LUMBAR FUSION N/A 02/02/2014   Procedure: CENTRAL LAMINECTOMY L4-5,L5-S1, TRANSFORAMINAL LUMBAR INTERBODY FUSION L4-5,L5-S1, RODS AND SCREW FIX  AND L5-S1, LOCAL BONE GRAFT;  Surgeon: Jessy Oto, MD;  Location: [place];  Service: Orthopedics;  Laterality: N/A;  . TONSILLECTOMY AND ADENOIDECTOMY     Social History  Substance Use Topics  . Smoking status: Current Every Day Smoker    Packs/day: 0.50    Years: 40.00    Types: Cigarettes  . Smokeless tobacco: Never Used  . Alcohol use Yes     Comment: occassional -  holidays   Family History  Problem Relation Age of Onset  . Cancer Mother 25       cervical  . CVA Mother   . Hypertension Mother   . Thyroid disease Daughter    Allergies  Allergen Reactions  . Shrimp [Shellfish Allergy] Hives    May be seasoning not always   Current Outpatient Prescriptions on File Prior to Visit  Medication Sig Dispense Refill  . aspirin EC 81 MG tablet Take 81 mg by mouth daily.    Marland Kitchen  atenolol (TENORMIN) 100 MG tablet Take 1 tablet (100 mg total) by mouth daily. 90 tablet 0  . cloNIDine (CATAPRES) 0.1 MG tablet TAKE 1 TABLET EVERY DAY 90 tablet 3  . famotidine (PEPCID) 20 MG tablet Take 1 tablet (20 mg total) by mouth 2 (two) times daily as needed for heartburn. 30 tablet 0  . hydrochlorothiazide (HYDRODIURIL) 25 MG tablet TAKE 1 TABLET EVERY DAY 90 tablet 2  . lisinopril (PRINIVIL,ZESTRIL) 40 MG tablet Take 1 tablet (40 mg total) by mouth daily. 90 tablet 2  . methimazole (TAPAZOLE) 5 MG tablet 1 tab, 5 times per week. 75 tablet 2  . oxyCODONE-acetaminophen (PERCOCET/ROXICET) 5-325 MG tablet Take 1-2 tablets by mouth every 4 (four) hours as needed for moderate pain. 60 tablet 0  .  potassium chloride (K-DUR,KLOR-CON) 10 MEQ tablet TAKE 1 TABLET EVERY DAY 90 tablet 2   No current facility-administered medications on file prior to visit.    The PMH, PSH, Social History, Family History, Medications, and allergies have been reviewed in [facility], and have been updated if relevant.   Review of Systems  Constitutional: Negative.   HENT: Negative.   Eyes: Negative.   Respiratory: Negative.   Cardiovascular: Negative.   Gastrointestinal: Negative.   Endocrine: Negative for heat intolerance.  Genitourinary: Negative.   Musculoskeletal: Negative.   Skin: Negative.   Allergic/Immunologic: Negative.   Neurological: Negative.   Hematological: Negative.   Psychiatric/Behavioral: Negative.        Objective:   Physical Exam BP 132/82 (BP Location: Right Arm, Patient Position: Sitting, Cuff Size: Large)   Pulse (!) 57   Temp 98.4 F (36.9 C) (Oral)   Ht 5' 6.5" (1.689 m) Comment: no shoes  Wt 261 lb 4 oz (118.5 kg)   SpO2 97%   BMI 41.54 kg/m   Wt Readings from Last 3 Encounters:  08/21/16 261 lb 4 oz (118.5 kg)  08/21/16 261 lb 4 oz (118.5 kg)  08/01/16 269 lb (122 kg)    General:  Well-developed,well-nourished,in no acute distress; alert,appropriate and cooperative throughout examination Head:  normocephalic and atraumatic.   Eyes:  vision grossly intact, PERRL Ears:  R ear normal and L ear normal externally, TMs clear bilaterally Nose:  no external deformity.   Mouth:  good dentition.   Neck:  No deformities, masses, or tenderness noted. Breasts:  No mass, nodules, thickening, tenderness, bulging, retraction, inflamation, nipple discharge or skin changes noted.   Lungs:  Normal respiratory effort, chest expands symmetrically. Lungs are clear to auscultation, no crackles or wheezes. Heart:  Normal rate and regular rhythm. S1 and S2 normal without gallop, murmur, click, rub or other extra sounds. Abdomen:  Bowel sounds positive,abdomen soft and non-tender without  masses, organomegaly or hernias noted. Rectal:  no external abnormalities.   Genitalia:  Pelvic Exam:        External: normal female genitalia without lesions or masses        Vagina: normal without lesions or masses        Cervix: normal without lesions or masses        Adnexa: normal bimanual exam without masses or fullness        Uterus: normal by palpation        Pap smear: performed Msk:  No deformity or scoliosis noted of thoracic or lumbar spine.   Extremities:  No clubbing, cyanosis, edema, or deformity noted with normal full range of motion of all joints.   Neurologic:  alert & oriented X3 and  gait normal.   Skin:  Intact without suspicious lesions or rashes Cervical Nodes:  No lymphadenopathy noted Axillary Nodes:  No palpable lymphadenopathy Psych:  Cognition and judgment appear intact. Alert and cooperative with normal attention span and concentration. No apparent delusions, illusions, hallucinations       Assessment & Plan:

## 2016-08-21 NOTE — Progress Notes (Signed)
Pre visit review using our clinic review tool, if applicable. No additional management support is needed unless otherwise documented below in the visit note. 

## 2016-08-21 NOTE — Progress Notes (Signed)
Subjective:   Kathy Herrera is a 56 y.o. female who presents for Medicare Annual (Subsequent) preventive examination.  Review of Systems:  N/A Cardiac Risk Factors include: smoking/ tobacco exposure;obesity (BMI >30kg/m2);dyslipidemia;hypertension     Objective:     Vitals: BP 132/82 (BP Location: Right Arm, Patient Position: Sitting, Cuff Size: Large)   Pulse (!) 57   Temp 98.4 F (36.9 C) (Oral)   Ht 5' 6.5" (1.689 m) Comment: shoes  Wt 261 lb 4 oz (118.5 kg)   SpO2 97%   BMI 41.54 kg/m   Body mass index is 41.54 kg/m.   Tobacco History  Smoking Status  . Current Every Day Smoker  . Packs/day: 0.50  . Years: 40.00  . Types: Cigarettes  Smokeless Tobacco  . Never Used     Ready to quit: No Counseling given: No   Past Medical History:  Diagnosis Date  . DJD (degenerative joint disease) of lumbar spine   . GERD (gastroesophageal reflux disease)    not everyday and uses tums when needed  . Heart murmur   . Hyperlipidemia   . Hypertension   . Perforated viscus 07/2015   Past Surgical History:  Procedure Laterality Date  . ABDOMINAL HYSTERECTOMY     partial- still has cervix.  Marland Kitchen arm surgery Left   . CHOLECYSTECTOMY    . LUMBAR FUSION N/A 02/02/2014   Procedure: CENTRAL LAMINECTOMY L4-5,L5-S1, TRANSFORAMINAL LUMBAR INTERBODY FUSION L4-5,L5-S1, RODS AND SCREW FIX  AND L5-S1, LOCAL BONE GRAFT;  Surgeon: Jessy Oto, MD;  Location: [place];  Service: Orthopedics;  Laterality: N/A;  . TONSILLECTOMY AND ADENOIDECTOMY     Family History  Problem Relation Age of Onset  . Cancer Mother 75       cervical  . CVA Mother   . Hypertension Mother   . Thyroid disease Daughter    History  Sexual Activity  . Sexual activity: Not on file    Outpatient Encounter Prescriptions as of 08/21/2016  Medication Sig  . aspirin EC 81 MG tablet Take 81 mg by mouth daily.  Marland Kitchen atenolol (TENORMIN) 100 MG tablet Take 1 tablet (100 mg total) by mouth daily.  . cloNIDine  (CATAPRES) 0.1 MG tablet TAKE 1 TABLET EVERY DAY  . famotidine (PEPCID) 20 MG tablet Take 1 tablet (20 mg total) by mouth 2 (two) times daily as needed for heartburn.  . hydrochlorothiazide (HYDRODIURIL) 25 MG tablet TAKE 1 TABLET EVERY DAY  . lisinopril (PRINIVIL,ZESTRIL) 40 MG tablet Take 1 tablet (40 mg total) by mouth daily.  . methimazole (TAPAZOLE) 5 MG tablet 1 tab, 5 times per week.  Marland Kitchen oxyCODONE-acetaminophen (PERCOCET/ROXICET) 5-325 MG tablet Take 1-2 tablets by mouth every 4 (four) hours as needed for moderate pain.  . potassium chloride (K-DUR,KLOR-CON) 10 MEQ tablet TAKE 1 TABLET EVERY DAY  . [DISCONTINUED] Budesonide (UCERIS) 9 MG TB24 Take by mouth.   No facility-administered encounter medications on file as of 08/21/2016.     Activities of Daily Living In your present state of health, do you have any difficulty performing the following activities: 08/21/2016 08/25/2015  Hearing? N N  Vision? N Y  Difficulty concentrating or making decisions? N N  Walking or climbing stairs? Y Y  Dressing or bathing? N N  Doing errands, shopping? N Y  Conservation officer, nature and eating ? N N  Using the Toilet? N N  In the past six months, have you accidently leaked urine? N N  Do you have problems with loss  of bowel control? N N  Managing your Medications? N N  Managing your Finances? N N  Housekeeping or managing your Housekeeping? N N  Some recent data might be hidden    Patient Care Team: Lucille Passy, MD as PCP - General (Family Medicine) Renato Shin, MD as Consulting Physician (Endocrinology) Jessy Oto, MD as Consulting Physician (Orthopedic Surgery) Nahser, Wonda Cheng, MD as Consulting Physician (Cardiology)    Assessment:     Hearing Screening   125Hz  250Hz  500Hz  1000Hz  2000Hz  3000Hz  4000Hz  6000Hz  8000Hz   Right ear:   40 40 40  40    Left ear:   40 40 40  40      Visual Acuity Screening   Right eye Left eye Both eyes  Without correction:     With correction: 20/15-1 20/25  20/25    Exercise Activities and Dietary recommendations Current Exercise Habits: The patient does not participate in regular exercise at present, Exercise limited by: orthopedic condition(s)  Goals    . Increase water intake          Starting 08/21/2016, I will continue to drink at least 6-8 glasses of water daily.       Fall Risk Fall Risk  08/21/2016 08/25/2015 08/15/2015 05/11/2015  Falls in the past year? No No Yes No  Number falls in past yr: - - 1 -  Injury with Fall? - - No -  Risk for fall due to : - Impaired balance/gait;Impaired mobility;Impaired vision;Medication side effect History of fall(s);Impaired balance/gait -  Follow up - Follow up appointment Education provided -   Depression Screen PHQ 2/9 Scores 08/21/2016 08/25/2015 08/15/2015 05/11/2015  PHQ - 2 Score 0 0 0 0     Cognitive Function MMSE - Mini Mental State Exam 08/21/2016  Orientation to time 5  Orientation to Place 5  Registration 3  Attention/ Calculation 0  Recall 3  Language- name 2 objects 0  Language- repeat 1  Language- follow 3 step command 3  Language- read & follow direction 0  Write a sentence 0  Copy design 0  Total score 20       PLEASE NOTE: A Mini-Cog screen was completed. Maximum score is 20. A value of 0 denotes this part of Folstein MMSE was not completed or the patient failed this part of the Mini-Cog screening.   Mini-Cog Screening Orientation to Time - Max 5 pts Orientation to Place - Max 5 pts Registration - Max 3 pts Recall - Max 3 pts Language Repeat - Max 1 pts Language Follow 3 Step Command - Max 3 pts   Immunization History  Administered Date(s) Administered  . Influenza,inj,Quad PF,36+ Mos 12/18/2012, 02/05/2014, 12/09/2014   Screening Tests Health Maintenance  Topic Date Due  . PAP SMEAR  08/21/2017 (Originally 10/09/2015)  . MAMMOGRAM  10/16/2017 (Originally 10/18/2014)  . TETANUS/TDAP  08/22/2026 (Originally 04/12/1978)  . INFLUENZA VACCINE  10/03/2016  .  COLONOSCOPY  01/18/2026  . Hepatitis C Screening  Completed  . HIV Screening  Completed      Plan:     I have personally reviewed and addressed the Medicare Annual Wellness questionnaire and have noted the following in the patient's chart:  A. Medical and social history B. Use of alcohol, tobacco or illicit drugs  C. Current medications and supplements D. Functional ability and status E.  Nutritional status F.  Physical activity G. Advance directives H. List of other physicians I.  Hospitalizations, surgeries, and ER visits in previous  12 months J.  [place] to include hearing, vision, cognitive, depression L. Referrals and appointments - none  In addition, I have reviewed and discussed with patient certain preventive protocols, quality metrics, and best practice recommendations. A written personalized care plan for preventive services as well as general preventive health recommendations were provided to patient.  See attached scanned questionnaire for additional information.   Signed,   Lindell Noe, MHA, BS, LPN Health Coach

## 2016-08-21 NOTE — Addendum Note (Signed)
Addended by: Mady Haagensen on: 08/21/2016 12:21 PM   Modules accepted: Orders

## 2016-08-21 NOTE — Assessment & Plan Note (Signed)
Reviewed preventive care protocols, scheduled due services, and updated immunizations Discussed nutrition, exercise, diet, and healthy lifestyle.  Labs drawn but not yet resulted.  Pap smear and breast exam done today.  Pt to schedule mammogram.

## 2016-08-21 NOTE — Patient Instructions (Addendum)
Ms. Hollinshead , Thank you for taking time to come for your Medicare Wellness Visit. I appreciate your ongoing commitment to your health goals. Please review the following plan we discussed and let me know if I can assist you in the future.   These are the goals we discussed: Goals    . Increase water intake          Starting 08/21/2016, I will continue to drink at least 6-8 glasses of water daily.        This is a list of the screening recommended for you and due dates:  Health Maintenance  Topic Date Due  . Pap Smear  08/21/2017*  . Mammogram  10/16/2017*  . Tetanus Vaccine  08/22/2026*  . Flu Shot  10/03/2016  . Colon Cancer Screening  01/18/2026  .  Hepatitis C: One time screening is recommended by Center for Disease Control  (CDC) for  adults born from [birth year] through 1966.   Completed  . HIV Screening  Completed  *Topic was postponed. The date shown is not the original due date.   Preventive Care for Adults  A healthy lifestyle and preventive care can promote health and wellness. Preventive health guidelines for adults include the following key practices.  . A routine yearly physical is a good way to check with your health care provider about your health and preventive screening. It is a chance to share any concerns and updates on your health and to receive a thorough exam.  . Visit your dentist for a routine exam and preventive care every 6 months. Brush your teeth twice a day and floss once a day. Good oral hygiene prevents tooth decay and gum disease.  . The frequency of eye exams is based on your age, health, family medical history, use  of contact lenses, and other factors. Follow your health care provider's ecommendations for frequency of eye exams.  . Eat a healthy diet. Foods like vegetables, fruits, whole grains, low-fat dairy products, and lean protein foods contain the nutrients you need without too many calories. Decrease your intake of foods high in solid fats, added  sugars, and salt. Eat the right amount of calories for you. Get information about a proper diet from your health care provider, if necessary.  . Regular physical exercise is one of the most important things you can do for your health. Most adults should get at least 150 minutes of moderate-intensity exercise (any activity that increases your heart rate and causes you to sweat) each week. In addition, most adults need muscle-strengthening exercises on 2 or more days a week.  Silver Sneakers may be a benefit available to you. To determine eligibility, you may visit the website: www.silversneakers.com or contact program at 331-532-6314 Mon-Fri between 8AM-8PM.   . Maintain a healthy weight. The body mass index (BMI) is a screening tool to identify possible weight problems. It provides an estimate of body fat based on height and weight. Your health care provider can find your BMI and can help you achieve or maintain a healthy weight.   For adults 20 years and older: ? A BMI below 18.5 is considered underweight. ? A BMI of 18.5 to 24.9 is normal. ? A BMI of 25 to 29.9 is considered overweight. ? A BMI of 30 and above is considered obese.   . Maintain normal blood lipids and cholesterol levels by exercising and minimizing your intake of saturated fat. Eat a balanced diet with plenty of fruit and vegetables. Blood tests  for lipids and cholesterol should begin at age 92 and be repeated every 5 years. If your lipid or cholesterol levels are high, you are over 50, or you are at high risk for heart disease, you may need your cholesterol levels checked more frequently. Ongoing high lipid and cholesterol levels should be treated with medicines if diet and exercise are not working.  . If you smoke, find out from your health care provider how to quit. If you do not use tobacco, please do not start.  . If you choose to drink alcohol, please do not consume more than 2 drinks per day. One drink is considered to  be 12 ounces (355 mL) of beer, 5 ounces (148 mL) of wine, or 1.5 ounces (44 mL) of liquor.  . If you are 21-49 years old, ask your health care provider if you should take aspirin to prevent strokes.  . Use sunscreen. Apply sunscreen liberally and repeatedly throughout the day. You should seek shade when your shadow is shorter than you. Protect yourself by wearing long sleeves, pants, a wide-brimmed hat, and sunglasses year round, whenever you are outdoors.  . Once a month, do a whole body skin exam, using a mirror to look at the skin on your back. Tell your health care provider of new moles, moles that have irregular borders, moles that are larger than a pencil eraser, or moles that have changed in shape or color.

## 2016-08-21 NOTE — Patient Instructions (Signed)
Great to see you. We will call you with your results from today.  Please call to schedule your mammogram.

## 2016-08-21 NOTE — Progress Notes (Signed)
PCP notes:   Health maintenance:  Mammogram - PCP please address at next appt PAP smear - PCP please address at next appt Tetanus - postponed/insurance Hep C screening - completed HIV screening - completed  Abnormal screenings:   None  Patient concerns:   None  Nurse concerns:  None  Next PCP appt:   08/21/16 @ 1215

## 2016-08-21 NOTE — Assessment & Plan Note (Signed)
Reviewed preventive care protocols, scheduled due services, and updated immunizations Discussed nutrition, exercise, diet, and healthy lifestyle.  Orders Placed This Encounter  Procedures  . CBC with Differential/Platelet  . Comprehensive metabolic panel  . Lipid panel  . TSH  . T4, Free  . T3, Free

## 2016-08-21 NOTE — Progress Notes (Signed)
I reviewed health advisor's note, was available for consultation, and agree with documentation and plan.  

## 2016-08-21 NOTE — Assessment & Plan Note (Addendum)
Followed by endo. Continue Methimazole. Check labs today.

## 2016-08-21 NOTE — Assessment & Plan Note (Signed)
Well controlled on current rxs. No changes made today. 

## 2016-08-22 LAB — CYTOLOGY - PAP
Chlamydia: NEGATIVE
Diagnosis: NEGATIVE
HPV: NOT DETECTED
Neisseria Gonorrhea: NEGATIVE

## 2016-08-22 LAB — HEPATITIS C ANTIBODY: HCV Ab: NEGATIVE

## 2016-08-22 LAB — HIV ANTIBODY (ROUTINE TESTING W REFLEX): HIV 1&2 Ab, 4th Generation: NONREACTIVE

## 2016-08-27 LAB — CERVICOVAGINAL ANCILLARY ONLY: Herpes: NEGATIVE

## 2016-09-28 ENCOUNTER — Other Ambulatory Visit: Payer: Self-pay | Admitting: Family Medicine

## 2016-11-28 ENCOUNTER — Encounter: Payer: Self-pay | Admitting: Endocrinology

## 2016-11-28 ENCOUNTER — Ambulatory Visit (INDEPENDENT_AMBULATORY_CARE_PROVIDER_SITE_OTHER): Payer: Medicare HMO | Admitting: Endocrinology

## 2016-11-28 VITALS — BP 138/88 | HR 84 | Wt 265.8 lb

## 2016-11-28 DIAGNOSIS — E0590 Thyrotoxicosis, unspecified without thyrotoxic crisis or storm: Secondary | ICD-10-CM | POA: Diagnosis not present

## 2016-11-28 LAB — T4, FREE: Free T4: 0.6 ng/dL (ref 0.60–1.60)

## 2016-11-28 LAB — TSH: TSH: 22.44 u[IU]/mL — ABNORMAL HIGH (ref 0.35–4.50)
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
## 2016-11-28 NOTE — Patient Instructions (Addendum)
Thyroid blood tests are requested for you today.  We'll let you know about the results.     If ever you have fever while taking methimazole, stop it and call us, even if the reason is obvious, because of the risk of a rare side-effect.   Please come back for a follow-up appointment in 4 months.   

## 2016-12-24 ENCOUNTER — Other Ambulatory Visit: Payer: Self-pay | Admitting: Family Medicine

## 2017-02-04 ENCOUNTER — Encounter: Payer: Self-pay | Admitting: Family Medicine

## 2017-02-04 ENCOUNTER — Ambulatory Visit: Payer: Medicare HMO

## 2017-02-04 ENCOUNTER — Ambulatory Visit: Payer: Medicare HMO | Admitting: Family Medicine

## 2017-02-04 VITALS — BP 124/82 | HR 100 | Temp 98.1°F | Ht 66.5 in | Wt 261.4 lb

## 2017-02-04 MED ORDER — ATENOLOL 100 MG PO TABS: 100.0000 mg | ORAL_TABLET | Freq: Every day | ORAL | 2 refills | Status: DC

## 2017-03-19 ENCOUNTER — Other Ambulatory Visit: Payer: Self-pay | Admitting: Family Medicine

## 2017-03-20 ENCOUNTER — Telehealth: Payer: Self-pay

## 2017-03-20 NOTE — Telephone Encounter (Signed)
I would be honored and very happy to see him but unfortunately I do no make decisions about payment.  Will forward to [facility] for their input.

## 2017-03-20 NOTE — Telephone Encounter (Signed)
TA-Mom states that son Kathy Herrera DOB: 04/20/1991 with same address as mother) states that he is experiencing what she feels is depression symptoms without SI or HI/he agrees to be seen by you here to be assessed but he doesn't have any insurance/she was made aware that it will cost $80.00 out of pocket and he doesn't have all of that at once/is requesting to be able to make a $20.00 payment at time of visit then pay $20.00 for the next 3 consecutive weeks untill completely paid off/he is having trouble with tasks of daily living/Plz advise and I can set up this appointment accordingly/thx dmf  Copied from [place] [id number]. Topic: General - Other >> Mar 15, 2017  9:14 AM Valla Leaver wrote: Reason for CRM: Patient would like a call back from [name] about personal matters. Please advise.

## 2017-03-21 NOTE — Telephone Encounter (Signed)
Thank you :)

## 2017-03-21 NOTE — Telephone Encounter (Signed)
I called patient to follow up with message I received. After speaking with Kathy Herrera and another administrative person. I informed her we are unable to make payment arrangements. Patient has to contact the billing department for assistance. Patient mother informed of another option for her son to be seen. Patient given information about [name] and phone number. Patient informed patient can be seen at that location and they have a financial counselor that can assist with applying for financial assistance. Patient mother informed that if patient gets financial assistance and is covered, patient can bring [place] the letter and transition care to [name]. Patient mother given [facility], address and phone number.

## 2017-04-01 ENCOUNTER — Ambulatory Visit: Payer: Medicare HMO | Admitting: Endocrinology

## 2017-04-30 ENCOUNTER — Encounter: Payer: Self-pay | Admitting: Endocrinology

## 2017-04-30 ENCOUNTER — Ambulatory Visit: Payer: Medicare HMO | Admitting: Endocrinology

## 2017-04-30 VITALS — BP 132/80 | HR 97 | Ht 66.5 in | Wt 264.0 lb

## 2017-04-30 DIAGNOSIS — E0590 Thyrotoxicosis, unspecified without thyrotoxic crisis or storm: Secondary | ICD-10-CM

## 2017-04-30 LAB — T4, FREE: Free T4: 0.49 ng/dL — ABNORMAL LOW (ref 0.60–1.60)

## 2017-04-30 LAB — TSH: TSH: 17.21 u[IU]/mL — ABNORMAL HIGH (ref 0.35–4.50)
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
## 2017-04-30 NOTE — Patient Instructions (Addendum)
blood tests are requested for you today.  We'll let you know about the results.  Please come back for a follow-up appointment in 6 weeks.  

## 2017-05-09 IMAGING — CR DG CHEST 2V
2 series · 2 of 2 positions shown · non-contrast
Comparison: None.

CLINICAL DATA: Chest x-ray prior to beginning Humira , smoking
history

EXAM:
CHEST  2 VIEW

[w chest pa]
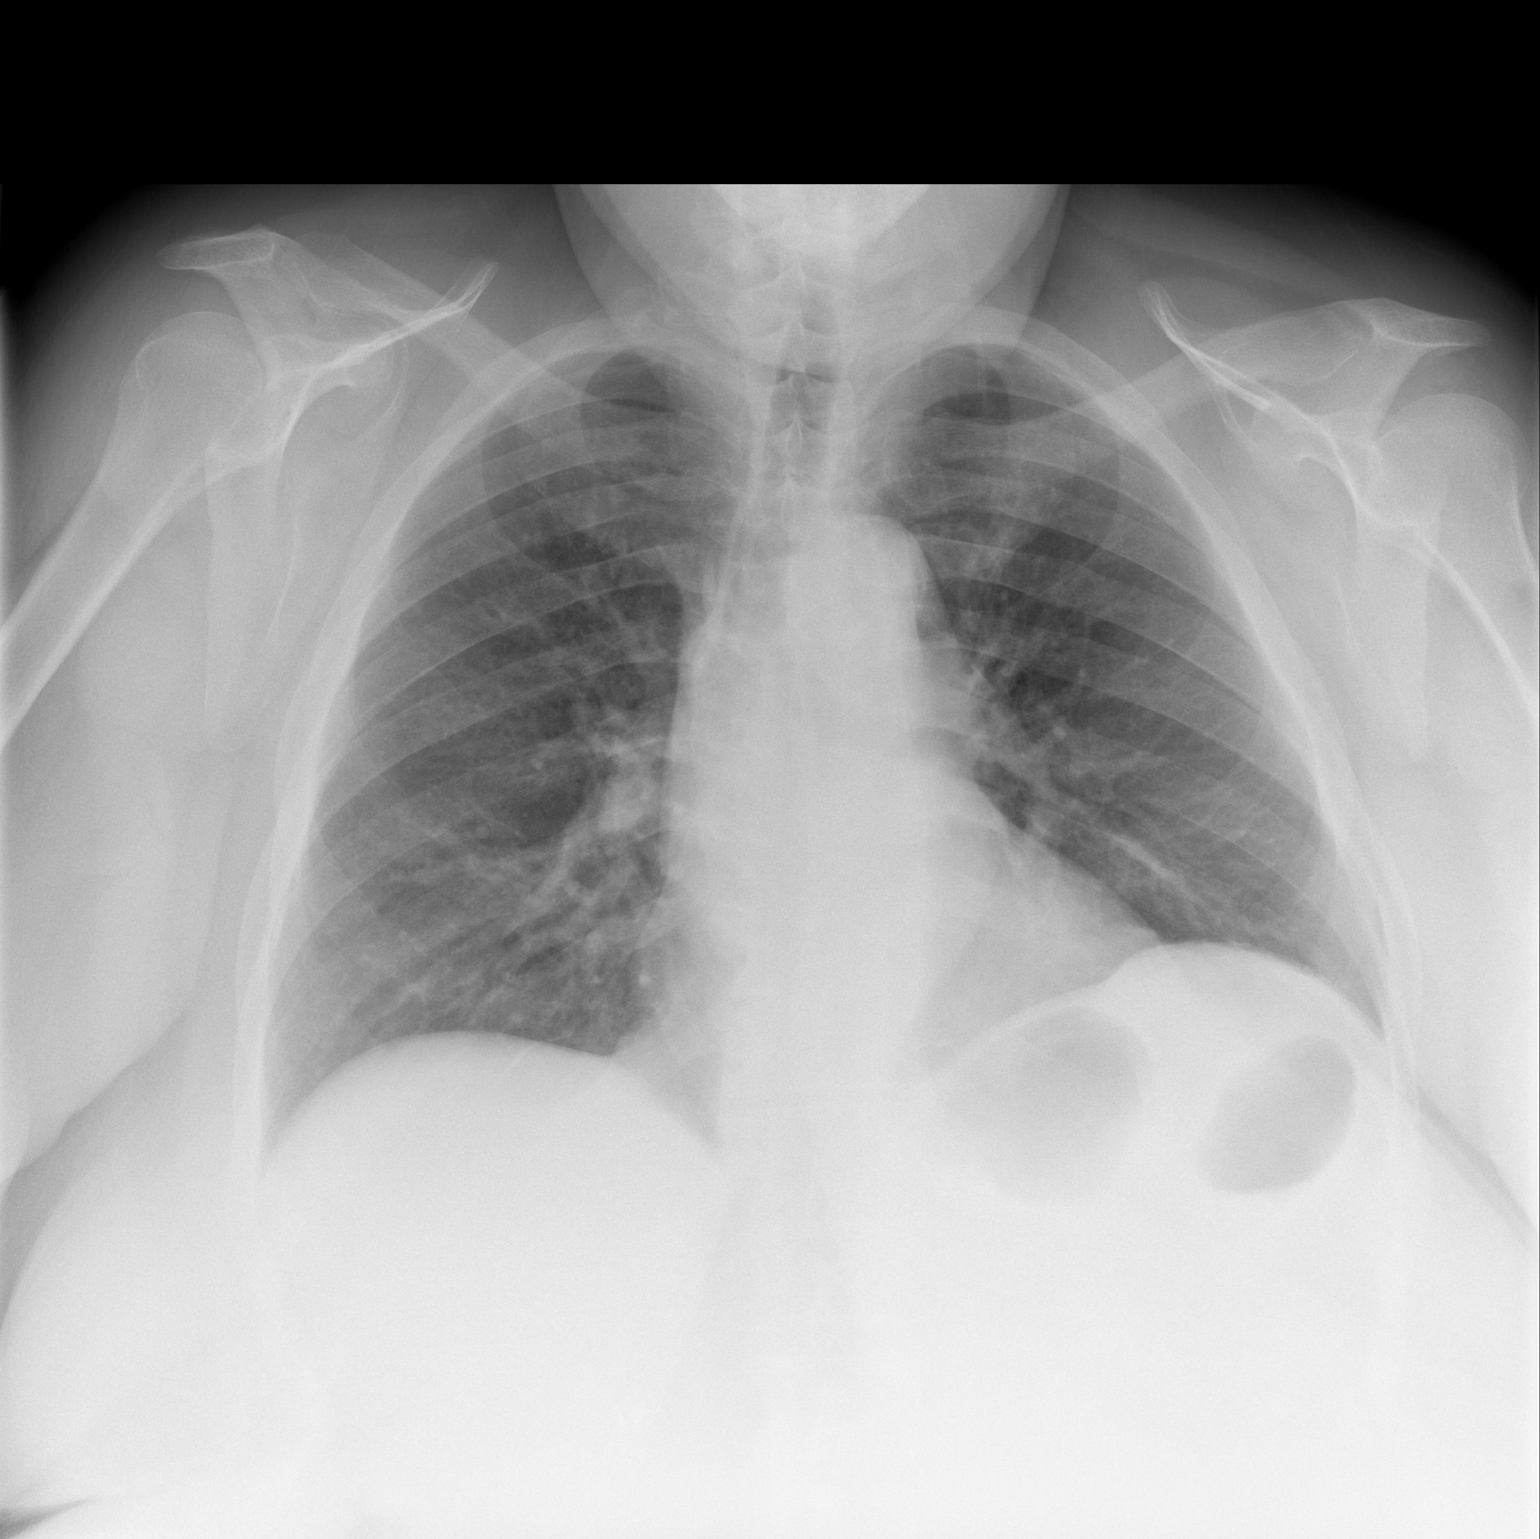

[w chest lat]
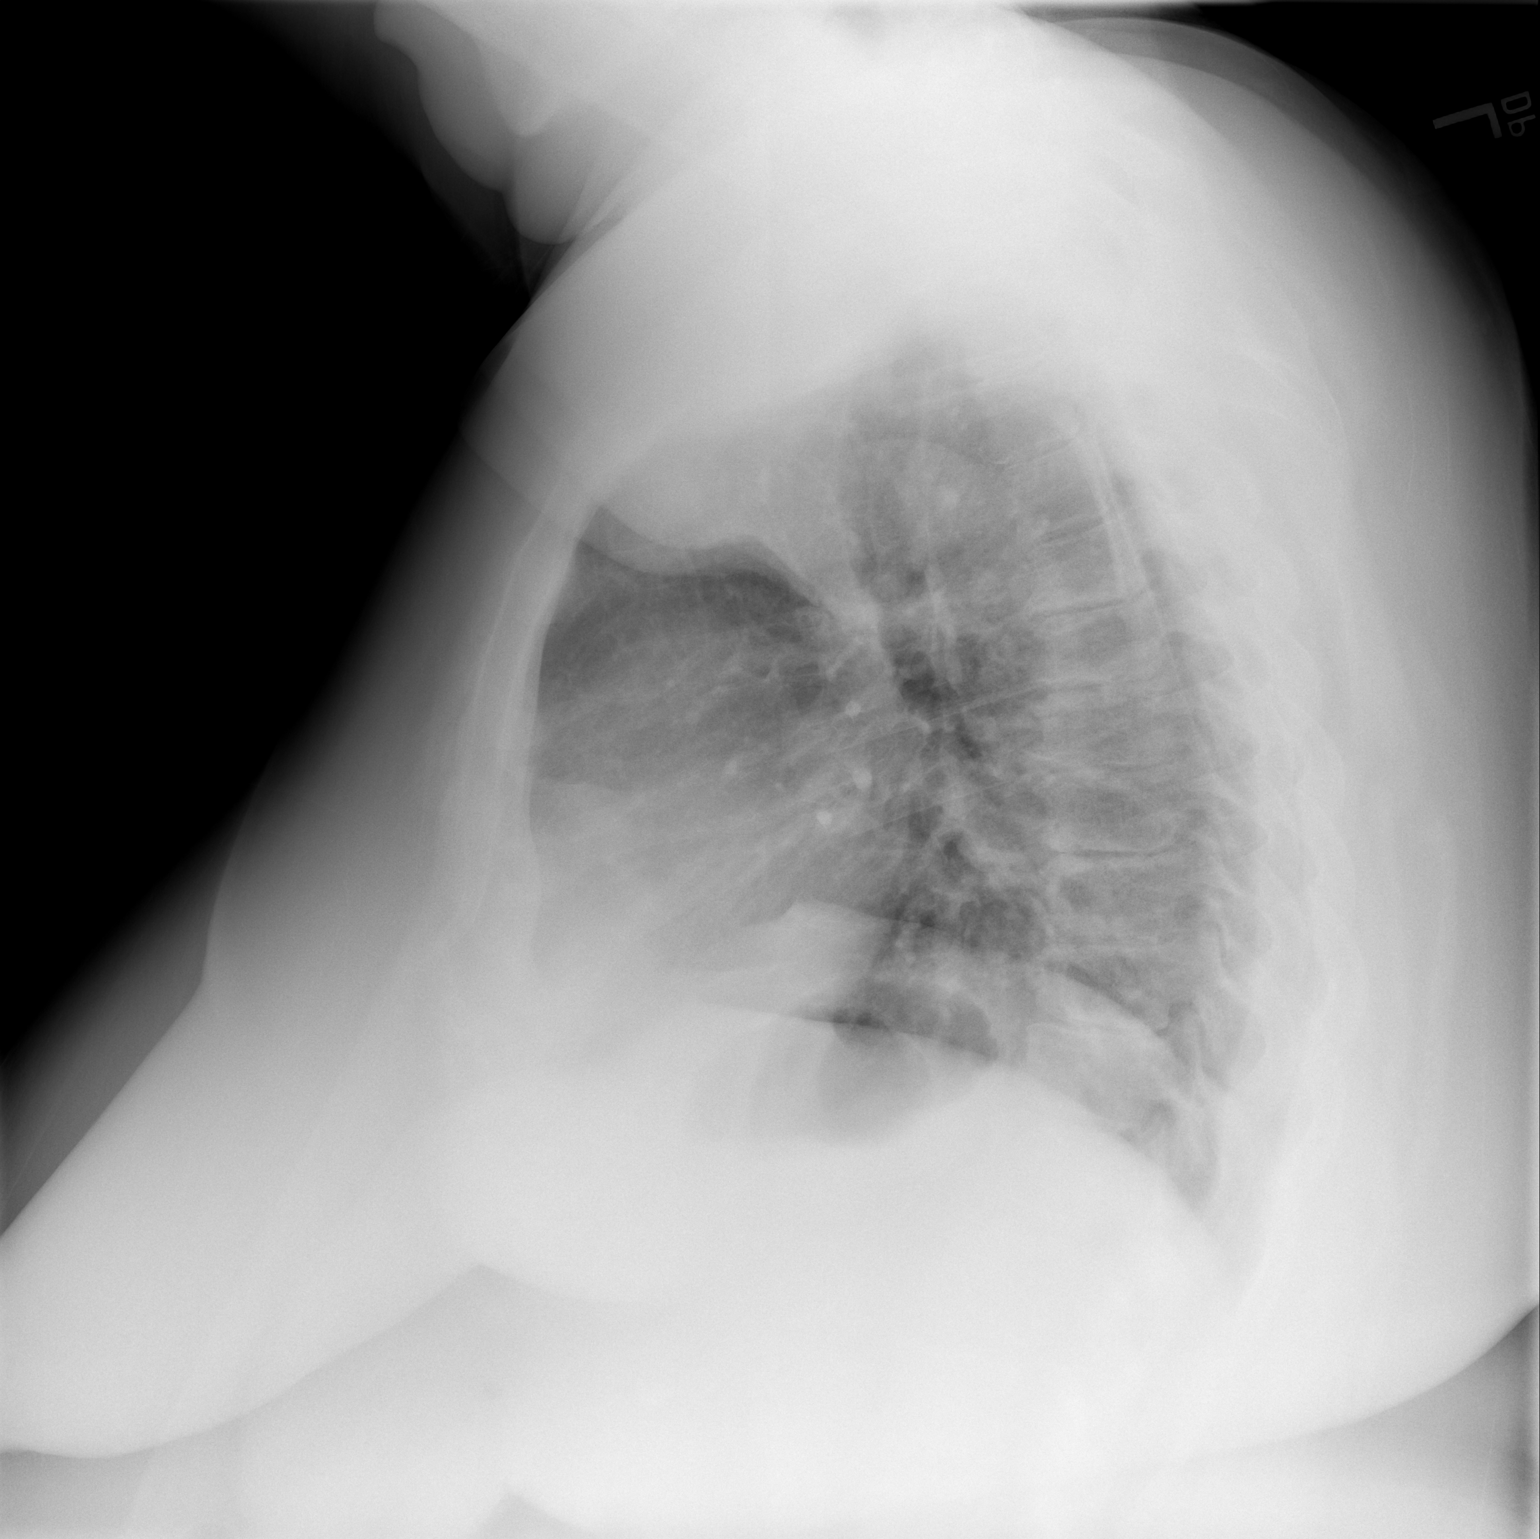

[2 of 2 positions shown; findings below may reference images not displayed]

FINDINGS: No active infiltrate or effusion is seen. Mediastinal and hilar
contours are unremarkable. The heart is within normal limits in
size. No acute bony abnormality seen with mild degenerative change
in the mid to lower thoracic spine.
IMPRESSION: No active cardiopulmonary disease.

## 2017-06-11 ENCOUNTER — Encounter: Payer: Self-pay | Admitting: Endocrinology

## 2017-06-11 ENCOUNTER — Ambulatory Visit: Payer: Medicare HMO | Admitting: Endocrinology

## 2017-06-11 VITALS — BP 110/80 | HR 88 | Temp 98.6°F | Wt 249.8 lb

## 2017-06-11 DIAGNOSIS — E0590 Thyrotoxicosis, unspecified without thyrotoxic crisis or storm: Secondary | ICD-10-CM | POA: Diagnosis not present

## 2017-06-11 LAB — TSH: TSH: 0.04 u[IU]/mL — ABNORMAL LOW (ref 0.35–4.50)

## 2017-06-11 LAB — T4, FREE: Free T4: 1.25 ng/dL (ref 0.60–1.60)
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
## 2017-06-11 NOTE — Patient Instructions (Signed)
blood tests are requested for you today.  We'll let you know about the results.   Please come back for a follow-up appointment in 3 months.   

## 2017-07-15 ENCOUNTER — Ambulatory Visit: Payer: Medicare HMO | Admitting: Endocrinology

## 2017-07-15 ENCOUNTER — Encounter: Payer: Self-pay | Admitting: Endocrinology

## 2017-07-15 VITALS — BP 118/72 | HR 78 | Wt 252.0 lb

## 2017-07-15 DIAGNOSIS — Z6841 Body Mass Index (BMI) 40.0 and over, adult: Secondary | ICD-10-CM | POA: Diagnosis not present

## 2017-07-15 DIAGNOSIS — E0590 Thyrotoxicosis, unspecified without thyrotoxic crisis or storm: Secondary | ICD-10-CM | POA: Diagnosis not present

## 2017-07-15 DIAGNOSIS — R51 Headache: Secondary | ICD-10-CM | POA: Diagnosis not present

## 2017-07-15 DIAGNOSIS — R519 Headache, unspecified: Secondary | ICD-10-CM | POA: Insufficient documentation

## 2017-07-15 LAB — T4, FREE: Free T4: 0.49 ng/dL — ABNORMAL LOW (ref 0.60–1.60)

## 2017-07-15 LAB — TSH: TSH: 13.56 u[IU]/mL — ABNORMAL HIGH (ref 0.35–4.50)

## 2017-07-15 LAB — SEDIMENTATION RATE: Sed Rate: 45 mm/hr — ABNORMAL HIGH (ref 0–30)
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
## 2017-07-15 NOTE — Patient Instructions (Addendum)
blood tests are requested for you today.  We'll let you know about the results.   If ever you have fever while taking methimazole, stop it and call us, even if the reason is obvious, because of the risk of a rare side-effect.  Please come back for a follow-up appointment in 2 months.   
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
## 2017-09-16 ENCOUNTER — Ambulatory Visit: Payer: Medicare HMO

## 2017-09-18 ENCOUNTER — Other Ambulatory Visit: Payer: Medicare HMO

## 2017-09-18 ENCOUNTER — Other Ambulatory Visit: Payer: Self-pay

## 2017-09-18 ENCOUNTER — Encounter: Payer: Self-pay | Admitting: Behavioral Health

## 2017-09-18 ENCOUNTER — Ambulatory Visit (INDEPENDENT_AMBULATORY_CARE_PROVIDER_SITE_OTHER): Payer: Medicare HMO | Admitting: Behavioral Health

## 2017-09-18 VITALS — BP 160/100 | HR 76 | Ht 67.0 in | Wt 250.6 lb

## 2017-09-18 DIAGNOSIS — E785 Hyperlipidemia, unspecified: Secondary | ICD-10-CM

## 2017-09-18 DIAGNOSIS — Z0000 Encounter for general adult medical examination without abnormal findings: Secondary | ICD-10-CM | POA: Diagnosis not present

## 2017-09-18 DIAGNOSIS — Z1231 Encounter for screening mammogram for malignant neoplasm of breast: Secondary | ICD-10-CM

## 2017-09-18 DIAGNOSIS — I10 Essential (primary) hypertension: Secondary | ICD-10-CM | POA: Diagnosis not present

## 2017-09-18 DIAGNOSIS — Z1239 Encounter for other screening for malignant neoplasm of breast: Secondary | ICD-10-CM

## 2017-09-18 LAB — LIPID PANEL
Cholesterol: 190 mg/dL (ref 0–200)
HDL: 39.8 mg/dL (ref >39.00)
LDL Cholesterol: 110 mg/dL — ABNORMAL HIGH (ref 0–99)
NonHDL: 150.22
Total CHOL/HDL Ratio: 5
Triglycerides: 199 mg/dL — ABNORMAL HIGH (ref 0.0–149.0)
VLDL: 39.8 mg/dL (ref 0.0–40.0)

## 2017-09-18 LAB — COMPREHENSIVE METABOLIC PANEL
ALT: 9 U/L (ref 0–35)
AST: 12 U/L (ref 0–37)
Albumin: 3.9 g/dL (ref 3.5–5.2)
Alkaline Phosphatase: 76 U/L (ref 39–117)
BUN: 10 mg/dL (ref 6–23)
CO2: 26 mEq/L (ref 19–32)
Calcium: 9.5 mg/dL (ref 8.4–10.5)
Chloride: 106 mEq/L (ref 96–112)
Creatinine, Ser: 0.96 mg/dL (ref 0.40–1.20)
GFR: 76.65 mL/min (ref >60.00)
Glucose, Bld: 87 mg/dL (ref 70–99)
Potassium: 3.7 mEq/L (ref 3.5–5.1)
Sodium: 140 mEq/L (ref 135–145)
Total Bilirubin: 0.4 mg/dL (ref 0.2–1.2)
Total Protein: 7.5 g/dL (ref 6.0–8.3)

## 2017-09-18 NOTE — Addendum Note (Signed)
Addended by: Lynnea Ferrier on: 09/18/2017 11:08 AM   Modules accepted: Orders

## 2017-09-18 NOTE — Patient Instructions (Signed)
Your blood pressure was high today. This was most likely caused by you not taking your medications. Please take your blood pressure medicine as directed and follow up with Dr.Aron as scheduled 09/23/17. Also monitor your blood pressure at home and call our office or see medical treatment if it continues to stay elevated as discussed.  Please schedule mammogram.  Continue to eat heart healthy diet (full of fruits, vegetables, whole grains, lean protein, water--limit salt, fat, and sugar intake) and increase physical activity as tolerated.  Continue doing brain stimulating activities (puzzles, reading, adult coloring books, staying active) to keep memory sharp.   Bring a copy of your living will and/or healthcare power of attorney to your next office visit.   Kathy Herrera , Thank you for taking time to come for your Medicare Wellness Visit. I appreciate your ongoing commitment to your health goals. Please review the following plan we discussed and let me know if I can assist you in the future.   These are the goals we discussed: Goals    . Start going to water aerobics once per week.        This is a list of the screening recommended for you and due dates:  Health Maintenance  Topic Date Due  . Mammogram  10/16/2017*  . Tetanus Vaccine  08/22/2026*  . Flu Shot  10/03/2017  . Pap Smear  08/22/2019  . Colon Cancer Screening  01/18/2026  .  Hepatitis C: One time screening is recommended by Center for Disease Control  (CDC) for  adults born from [birth year] through 1966.   Completed  . HIV Screening  Completed  *Topic was postponed. The date shown is not the original due date.    Managing Your Hypertension Hypertension is commonly called high blood pressure. This is when the force of your blood pressing against the walls of your arteries is too strong. Arteries are blood vessels that carry blood from your heart throughout your body. Hypertension forces the heart to work harder to pump blood, and  may cause the arteries to become narrow or stiff. Having untreated or uncontrolled hypertension can cause heart attack, stroke, kidney disease, and other problems. What are blood pressure readings? A blood pressure reading consists of a higher number over a lower number. Ideally, your blood pressure should be below 120/80. The first ("top") number is called the systolic pressure. It is a measure of the pressure in your arteries as your heart beats. The second ("bottom") number is called the diastolic pressure. It is a measure of the pressure in your arteries as the heart relaxes. What does my blood pressure reading mean? Blood pressure is classified into four stages. Based on your blood pressure reading, your health care provider may use the following stages to determine what type of treatment you need, if any. Systolic pressure and diastolic pressure are measured in a unit called mm Hg. Normal  Systolic pressure: below 562.  Diastolic pressure: below 80. Elevated  Systolic pressure: 130-865.  Diastolic pressure: below 80. Hypertension stage 1  Systolic pressure: 784-696.  Diastolic pressure: 29-52. Hypertension stage 2  Systolic pressure: 841 or above.  Diastolic pressure: 90 or above. What health risks are associated with hypertension? Managing your hypertension is an important responsibility. Uncontrolled hypertension can lead to:  A heart attack.  A stroke.  A weakened blood vessel (aneurysm).  Heart failure.  Kidney damage.  Eye damage.  Metabolic syndrome.  Memory and concentration problems.  What changes can I make to  manage my hypertension? Hypertension can be managed by making lifestyle changes and possibly by taking medicines. Your health care provider will help you make a plan to bring your blood pressure within a normal range. Eating and drinking  Eat a diet that is high in fiber and potassium, and low in salt (sodium), added sugar, and fat. An example  eating plan is called the DASH (Dietary Approaches to Stop Hypertension) diet. To eat this way: ? Eat plenty of fresh fruits and vegetables. Try to fill half of your plate at each meal with fruits and vegetables. ? Eat whole grains, such as whole wheat pasta, brown rice, or whole grain bread. Fill about one quarter of your plate with whole grains. ? Eat low-fat diary products. ? Avoid fatty cuts of meat, processed or cured meats, and poultry with skin. Fill about one quarter of your plate with lean proteins such as fish, chicken without skin, beans, eggs, and tofu. ? Avoid premade and processed foods. These tend to be higher in sodium, added sugar, and fat.  Reduce your daily sodium intake. Most people with hypertension should eat less than 1,500 mg of sodium a day.  Limit alcohol intake to no more than 1 drink a day for nonpregnant women and 2 drinks a day for men. One drink equals 12 oz of beer, 5 oz of wine, or 1 oz of hard liquor. Lifestyle  Work with your health care provider to maintain a healthy body weight, or to lose weight. Ask what an ideal weight is for you.  Get at least 30 minutes of exercise that causes your heart to beat faster (aerobic exercise) most days of the week. Activities may include walking, swimming, or biking.  Include exercise to strengthen your muscles (resistance exercise), such as weight lifting, as part of your weekly exercise routine. Try to do these types of exercises for 30 minutes at least 3 days a week.  Do not use any products that contain nicotine or tobacco, such as cigarettes and e-cigarettes. If you need help quitting, ask your health care provider.  Control any long-term (chronic) conditions you have, such as high cholesterol or diabetes. Monitoring  Monitor your blood pressure at home as told by your health care provider. Your personal target blood pressure may vary depending on your medical conditions, your age, and other factors.  Have your  blood pressure checked regularly, as often as told by your health care provider. Working with your health care provider  Review all the medicines you take with your health care provider because there may be side effects or interactions.  Talk with your health care provider about your diet, exercise habits, and other lifestyle factors that may be contributing to hypertension.  Visit your health care provider regularly. Your health care provider can help you create and adjust your plan for managing hypertension. Will I need medicine to control my blood pressure? Your health care provider may prescribe medicine if lifestyle changes are not enough to get your blood pressure under control, and if:  Your systolic blood pressure is 130 or higher.  Your diastolic blood pressure is 80 or higher.  Take medicines only as told by your health care provider. Follow the directions carefully. Blood pressure medicines must be taken as prescribed. The medicine does not work as well when you skip doses. Skipping doses also puts you at risk for problems. Contact a health care provider if:  You think you are having a reaction to medicines you have taken.  You have repeated (recurrent) headaches.  You feel dizzy.  You have swelling in your ankles.  You have trouble with your vision. Get help right away if:  You develop a severe headache or confusion.  You have unusual weakness or numbness, or you feel faint.  You have severe pain in your chest or abdomen.  You vomit repeatedly.  You have trouble breathing. Summary  Hypertension is when the force of blood pumping through your arteries is too strong. If this condition is not controlled, it may put you at risk for serious complications.  Your personal target blood pressure may vary depending on your medical conditions, your age, and other factors. For most people, a normal blood pressure is less than 120/80.  Hypertension is managed by lifestyle  changes, medicines, or both. Lifestyle changes include weight loss, eating a healthy, low-sodium diet, exercising more, and limiting alcohol. This information is not intended to replace advice given to you by your health care provider. Make sure you discuss any questions you have with your health care provider. Document Released: 11/14/2011 Document Revised: 01/18/2016 Document Reviewed: 01/18/2016 Elsevier Interactive Patient Education  2018 [place] Eating Plan DASH stands for "Dietary Approaches to Stop Hypertension." The DASH eating plan is a healthy eating plan that has been shown to reduce high blood pressure (hypertension). It may also reduce your risk for type 2 diabetes, heart disease, and stroke. The DASH eating plan may also help with weight loss. What are tips for following this plan? General guidelines  Avoid eating more than 2,300 mg (milligrams) of salt (sodium) a day. If you have hypertension, you may need to reduce your sodium intake to 1,500 mg a day.  Limit alcohol intake to no more than 1 drink a day for nonpregnant women and 2 drinks a day for men. One drink equals 12 oz of beer, 5 oz of wine, or 1 oz of hard liquor.  Work with your health care provider to maintain a healthy body weight or to lose weight. Ask what an ideal weight is for you.  Get at least 30 minutes of exercise that causes your heart to beat faster (aerobic exercise) most days of the week. Activities may include walking, swimming, or biking.  Work with your health care provider or diet and nutrition specialist (dietitian) to adjust your eating plan to your individual calorie needs. Reading food labels  Check food labels for the amount of sodium per serving. Choose foods with less than 5 percent of the Daily Value of sodium. Generally, foods with less than 300 mg of sodium per serving fit into this eating plan.  To find whole grains, look for the word "whole" as the first word in the ingredient  list. Shopping  Buy products labeled as "low-sodium" or "no salt added."  Buy fresh foods. Avoid canned foods and premade or frozen meals. Cooking  Avoid adding salt when cooking. Use salt-free seasonings or herbs instead of table salt or sea salt. Check with your health care provider or pharmacist before using salt substitutes.  Do not fry foods. Cook foods using healthy methods such as baking, boiling, grilling, and broiling instead.  Cook with heart-healthy oils, such as olive, canola, soybean, or sunflower oil. Meal planning   Eat a balanced diet that includes: ? 5 or more servings of fruits and vegetables each day. At each meal, try to fill half of your plate with fruits and vegetables. ? Up to 6-8 servings of whole grains each day. ?  Less than 6 oz of lean meat, poultry, or fish each day. A 3-oz serving of meat is about the same size as a deck of cards. One egg equals 1 oz. ? 2 servings of low-fat dairy each day. ? A serving of nuts, seeds, or beans 5 times each week. ? Heart-healthy fats. Healthy fats called Omega-3 fatty acids are found in foods such as flaxseeds and coldwater fish, like sardines, salmon, and mackerel.  Limit how much you eat of the following: ? Canned or prepackaged foods. ? Food that is high in trans fat, such as fried foods. ? Food that is high in saturated fat, such as fatty meat. ? Sweets, desserts, sugary drinks, and other foods with added sugar. ? Full-fat dairy products.  Do not salt foods before eating.  Try to eat at least 2 vegetarian meals each week.  Eat more home-cooked food and less restaurant, buffet, and fast food.  When eating at a restaurant, ask that your food be prepared with less salt or no salt, if possible. What foods are recommended? The items listed may not be a complete list. Talk with your dietitian about what dietary choices are best for you. Grains Whole-grain or whole-wheat bread. Whole-grain or whole-wheat pasta. Brown  rice. [place]. Bulgur. Whole-grain and low-sodium cereals. Pita bread. Low-fat, low-sodium crackers. Whole-wheat flour tortillas. Vegetables Fresh or frozen vegetables (raw, steamed, roasted, or grilled). Low-sodium or reduced-sodium tomato and vegetable juice. Low-sodium or reduced-sodium tomato sauce and tomato paste. Low-sodium or reduced-sodium canned vegetables. Fruits All fresh, dried, or frozen fruit. Canned fruit in natural juice (without added sugar). Meat and other protein foods Skinless chicken or [place]. Ground chicken or [place]. Pork with fat trimmed off. Fish and seafood. Egg whites. Dried beans, peas, or lentils. Unsalted nuts, nut butters, and seeds. Unsalted canned beans. Lean cuts of beef with fat trimmed off. Low-sodium, lean deli meat. Dairy Low-fat (1%) or fat-free (skim) milk. Fat-free, low-fat, or reduced-fat cheeses. Nonfat, low-sodium ricotta or cottage cheese. Low-fat or nonfat yogurt. Low-fat, low-sodium cheese. Fats and oils Soft margarine without trans fats. Vegetable oil. Low-fat, reduced-fat, or light mayonnaise and salad dressings (reduced-sodium). Canola, safflower, olive, soybean, and sunflower oils. Avocado. Seasoning and other foods Herbs. Spices. Seasoning mixes without salt. Unsalted popcorn and pretzels. Fat-free sweets. What foods are not recommended? The items listed may not be a complete list. Talk with your dietitian about what dietary choices are best for you. Grains Baked goods made with fat, such as croissants, muffins, or some breads. Dry pasta or rice meal packs. Vegetables Creamed or fried vegetables. Vegetables in a cheese sauce. Regular canned vegetables (not low-sodium or reduced-sodium). Regular canned tomato sauce and paste (not low-sodium or reduced-sodium). Regular tomato and vegetable juice (not low-sodium or reduced-sodium). Angie Fava. Olives. Fruits Canned fruit in a light or heavy syrup. Fried fruit. Fruit in cream or butter  sauce. Meat and other protein foods Fatty cuts of meat. Ribs. Fried meat. Berniece Salines. Sausage. Bologna and other processed lunch meats. Salami. Fatback. Hotdogs. Bratwurst. Salted nuts and seeds. Canned beans with added salt. Canned or smoked fish. Whole eggs or egg yolks. Chicken or [place] with skin. Dairy Whole or 2% milk, cream, and half-and-half. Whole or full-fat cream cheese. Whole-fat or sweetened yogurt. Full-fat cheese. Nondairy creamers. Whipped toppings. Processed cheese and cheese spreads. Fats and oils Butter. Stick margarine. Lard. Shortening. Ghee. Bacon fat. Tropical oils, such as coconut, palm kernel, or palm oil. Seasoning and other foods Salted popcorn and pretzels. Onion salt,  garlic salt, seasoned salt, table salt, and sea salt. Worcestershire sauce. Tartar sauce. Barbecue sauce. Teriyaki sauce. Soy sauce, including reduced-sodium. Steak sauce. Canned and packaged gravies. Fish sauce. Oyster sauce. Cocktail sauce. Horseradish that you find on the shelf. Ketchup. Mustard. Meat flavorings and tenderizers. Bouillon cubes. Hot sauce and Tabasco sauce. Premade or packaged marinades. Premade or packaged taco seasonings. Relishes. Regular salad dressings. Where to find more information:  National Heart, Lung, and [place]: https://wilson-eaton.com/  American Heart Association: www.heart.org Summary  The DASH eating plan is a healthy eating plan that has been shown to reduce high blood pressure (hypertension). It may also reduce your risk for type 2 diabetes, heart disease, and stroke.  With the DASH eating plan, you should limit salt (sodium) intake to 2,300 mg a day. If you have hypertension, you may need to reduce your sodium intake to 1,500 mg a day.  When on the DASH eating plan, aim to eat more fresh fruits and vegetables, whole grains, lean proteins, low-fat dairy, and heart-healthy fats.  Work with your health care provider or diet and nutrition specialist (dietitian) to adjust  your eating plan to your individual calorie needs. This information is not intended to replace advice given to you by your health care provider. Make sure you discuss any questions you have with your health care provider. Document Released: 02/08/2011 Document Revised: 02/13/2016 Document Reviewed: 02/13/2016 Elsevier Interactive Patient Education  [employer].

## 2017-09-18 NOTE — Progress Notes (Signed)
I reviewed health advisor's note, was available for consultation, and agree with documentation and plan.  

## 2017-09-19 ENCOUNTER — Ambulatory Visit: Payer: Medicare HMO | Admitting: Endocrinology

## 2017-09-23 ENCOUNTER — Ambulatory Visit (INDEPENDENT_AMBULATORY_CARE_PROVIDER_SITE_OTHER): Payer: Medicare HMO | Admitting: Family Medicine

## 2017-09-23 ENCOUNTER — Encounter: Payer: Self-pay | Admitting: Family Medicine

## 2017-09-23 VITALS — BP 126/78 | HR 83 | Temp 98.3°F | Ht 67.0 in | Wt 246.8 lb

## 2017-09-23 DIAGNOSIS — Z0000 Encounter for general adult medical examination without abnormal findings: Secondary | ICD-10-CM

## 2017-09-23 DIAGNOSIS — I10 Essential (primary) hypertension: Secondary | ICD-10-CM

## 2017-09-23 DIAGNOSIS — E0590 Thyrotoxicosis, unspecified without thyrotoxic crisis or storm: Secondary | ICD-10-CM | POA: Diagnosis not present

## 2017-09-23 DIAGNOSIS — E785 Hyperlipidemia, unspecified: Secondary | ICD-10-CM | POA: Diagnosis not present

## 2017-09-23 MED ORDER — CLONIDINE HCL 0.1 MG PO TABS: 0.1000 mg | ORAL_TABLET | Freq: Every day | ORAL | 3 refills | Status: DC

## 2017-09-23 MED ORDER — POTASSIUM CHLORIDE CRYS ER 10 MEQ PO TBCR: 10.0000 meq | EXTENDED_RELEASE_TABLET | Freq: Every day | ORAL | 3 refills | Status: DC

## 2017-09-23 MED ORDER — ATENOLOL 100 MG PO TABS: 100.0000 mg | ORAL_TABLET | Freq: Every day | ORAL | 3 refills | Status: DC

## 2017-09-23 MED ORDER — HYDROCHLOROTHIAZIDE 25 MG PO TABS: 25.0000 mg | ORAL_TABLET | Freq: Every day | ORAL | 3 refills | Status: DC

## 2017-09-23 MED ORDER — LISINOPRIL 40 MG PO TABS: 40.0000 mg | ORAL_TABLET | Freq: Every day | ORAL | 3 refills | Status: DC

## 2017-09-23 NOTE — Progress Notes (Signed)
Checked in pt
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
## 2017-09-23 NOTE — Assessment & Plan Note (Addendum)
Total time spent with patient was 40 minutes, with 10 minutes spent specifically on problem visit concerning HTN and risk of stroke, kidney failure and other very serious consequences of poorly controlled HTN. Over 50% of those 10 minutes were spent counseling on HTN.   Her blood pressure did improved once she restarted HCTZ, atenolol and lisinopril. Also restarted Kdur since she restarted HCTZ. Did not restart clonidine and her BP is controlled and dry mouth has resolved.  I advised that it is okay for her to not restart the clonidine for now but to keep checking BP at home and to keep me updated. The patient indicates understanding of these issues and agrees with the plan.

## 2017-09-23 NOTE — Patient Instructions (Signed)
Great to see you.  Please call the breast center at [phone] to schedule your mammogram.  I am so sorry to hear about your friend.

## 2017-09-23 NOTE — Assessment & Plan Note (Signed)
Good control without rx.

## 2017-09-23 NOTE — Assessment & Plan Note (Signed)
No longer taking rx. Followed by endo, Dr. Loanne Drilling- has appt to see him tomorrow.

## 2017-09-23 NOTE — Progress Notes (Signed)
I reviewed health advisor's note, was available for consultation, and agree with documentation and plan.  

## 2017-09-23 NOTE — Assessment & Plan Note (Signed)
Reviewed preventive care protocols, scheduled due services, and updated immunizations Discussed nutrition, exercise, diet, and healthy lifestyle.  

## 2017-09-24 ENCOUNTER — Ambulatory Visit: Payer: Medicare HMO | Admitting: Endocrinology

## 2017-09-24 ENCOUNTER — Encounter: Payer: Self-pay | Admitting: Endocrinology

## 2017-09-24 VITALS — BP 138/82 | HR 94 | Wt 246.6 lb

## 2017-09-24 DIAGNOSIS — E0590 Thyrotoxicosis, unspecified without thyrotoxic crisis or storm: Secondary | ICD-10-CM | POA: Diagnosis not present

## 2017-09-24 LAB — T4, FREE: Free T4: 0.89 ng/dL (ref 0.60–1.60)

## 2017-09-24 LAB — TSH: TSH: 0.19 u[IU]/mL — ABNORMAL LOW (ref 0.35–4.50)

## 2017-09-24 MED ORDER — TRIAMCINOLONE ACETONIDE 0.1 % EX CREA: 1.0000 "application " | TOPICAL_CREAM | Freq: Four times a day (QID) | CUTANEOUS | 0 refills | Status: DC

## 2017-09-24 MED ORDER — METHIMAZOLE 5 MG PO TABS: 5.0000 mg | ORAL_TABLET | Freq: Every day | ORAL | 1 refills | Status: DC
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
## 2017-09-24 NOTE — Patient Instructions (Addendum)
Thyroid blood tests are requested for you today.  We'll let you know about the results.   Based on the results, we probably need to resume a small amount of the methimazole.   I have sent a prescription to your pharmacy, for a skin cream.   If ever you have fever while taking methimazole, stop it and call us, even if the reason is obvious, because of the risk of a rare side-effect.   Please come back for a follow-up appointment in 2 months.

## 2017-10-23 DIAGNOSIS — H2513 Age-related nuclear cataract, bilateral: Secondary | ICD-10-CM | POA: Diagnosis not present

## 2017-11-07 ENCOUNTER — Ambulatory Visit
Admission: RE | Admit: 2017-11-07 | Discharge: 2017-11-07 | Disposition: A | Discharge status: Home / Self Care | Payer: Medicare HMO | Source: Ambulatory Visit | Attending: Family Medicine | Admitting: Family Medicine

## 2017-11-07 DIAGNOSIS — Z1231 Encounter for screening mammogram for malignant neoplasm of breast: Secondary | ICD-10-CM | POA: Diagnosis not present

## 2017-11-07 DIAGNOSIS — Z1239 Encounter for other screening for malignant neoplasm of breast: Secondary | ICD-10-CM

## 2017-12-25 ENCOUNTER — Ambulatory Visit (INDEPENDENT_AMBULATORY_CARE_PROVIDER_SITE_OTHER): Payer: Medicare HMO | Admitting: Endocrinology

## 2017-12-25 ENCOUNTER — Encounter: Payer: Self-pay | Admitting: Endocrinology

## 2017-12-25 VITALS — BP 132/72 | HR 64 | Ht 67.0 in | Wt 245.4 lb

## 2017-12-25 DIAGNOSIS — E0590 Thyrotoxicosis, unspecified without thyrotoxic crisis or storm: Secondary | ICD-10-CM

## 2017-12-25 LAB — T4, FREE: Free T4: 0.7 ng/dL (ref 0.60–1.60)

## 2017-12-25 LAB — TSH: TSH: 4.95 u[IU]/mL — ABNORMAL HIGH (ref 0.35–4.50)

## 2017-12-25 MED ORDER — METHIMAZOLE 5 MG PO TABS: 2.5000 mg | ORAL_TABLET | Freq: Every day | ORAL | 1 refills | Status: DC

## 2017-12-25 NOTE — Patient Instructions (Addendum)
Thyroid blood tests are requested for you today.  We'll let you know about the results. If ever you have fever while taking methimazole, stop it and call us, even if the reason is obvious, because of the risk of a rare side-effect.  Please come back for a follow-up appointment in 3 months.  
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
## 2018-03-25 ENCOUNTER — Encounter: Payer: Self-pay | Admitting: Endocrinology

## 2018-03-25 ENCOUNTER — Ambulatory Visit (INDEPENDENT_AMBULATORY_CARE_PROVIDER_SITE_OTHER): Payer: Medicare HMO | Admitting: Endocrinology

## 2018-03-25 VITALS — BP 160/92 | HR 62 | Ht 67.0 in | Wt 252.2 lb

## 2018-03-25 DIAGNOSIS — E0590 Thyrotoxicosis, unspecified without thyrotoxic crisis or storm: Secondary | ICD-10-CM

## 2018-03-25 LAB — T4, FREE: Free T4: 0.65 ng/dL (ref 0.60–1.60)

## 2018-03-25 LAB — TSH: TSH: 1.58 u[IU]/mL (ref 0.35–4.50)
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
## 2018-03-25 NOTE — Patient Instructions (Signed)
Thyroid blood tests are requested for you today.  We'll let you know about the results. If ever you have fever while taking methimazole, stop it and call us, even if the reason is obvious, because of the risk of a rare side-effect.  Please come back for a follow-up appointment in 3 months.  

## 2018-06-23 ENCOUNTER — Encounter: Payer: Self-pay | Admitting: Endocrinology

## 2018-06-24 ENCOUNTER — Other Ambulatory Visit: Payer: Self-pay

## 2018-06-24 ENCOUNTER — Ambulatory Visit (INDEPENDENT_AMBULATORY_CARE_PROVIDER_SITE_OTHER): Payer: Medicare HMO | Admitting: Endocrinology

## 2018-06-24 DIAGNOSIS — E0590 Thyrotoxicosis, unspecified without thyrotoxic crisis or storm: Secondary | ICD-10-CM

## 2018-06-24 NOTE — Patient Instructions (Addendum)
Thyroid blood tests are requested for you today.  We'll let you know about the results.   If ever you have fever while taking methimazole, stop it and call us, even if the reason is obvious, because of the risk of a rare side-effect.   Please come back for a follow-up appointment in 3-4 months.
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
## 2018-06-25 ENCOUNTER — Other Ambulatory Visit (INDEPENDENT_AMBULATORY_CARE_PROVIDER_SITE_OTHER): Payer: Medicare HMO

## 2018-06-25 DIAGNOSIS — E0590 Thyrotoxicosis, unspecified without thyrotoxic crisis or storm: Secondary | ICD-10-CM | POA: Diagnosis not present

## 2018-06-25 LAB — T4, FREE: Free T4: 0.71 ng/dL (ref 0.60–1.60)

## 2018-06-25 LAB — TSH: TSH: 3.96 u[IU]/mL (ref 0.35–4.50)

## 2018-08-20 ENCOUNTER — Other Ambulatory Visit: Payer: Self-pay | Admitting: Endocrinology

## 2018-09-19 ENCOUNTER — Other Ambulatory Visit: Payer: Self-pay

## 2018-09-23 ENCOUNTER — Ambulatory Visit: Payer: Medicare HMO | Admitting: Endocrinology

## 2018-09-23 ENCOUNTER — Telehealth: Payer: Self-pay

## 2018-09-23 DIAGNOSIS — Z01419 Encounter for gynecological examination (general) (routine) without abnormal findings: Secondary | ICD-10-CM

## 2018-09-23 DIAGNOSIS — I10 Essential (primary) hypertension: Secondary | ICD-10-CM

## 2018-09-23 DIAGNOSIS — R700 Elevated erythrocyte sedimentation rate: Secondary | ICD-10-CM

## 2018-09-23 DIAGNOSIS — E785 Hyperlipidemia, unspecified: Secondary | ICD-10-CM

## 2018-09-23 NOTE — Telephone Encounter (Signed)
TA-This patient is scheduled to see Dr. Loanne Drilling on 7.23.20/looks like he orders more than just thyroid labs/after her visit can you plz advise me what you would like for me to future order for 8.20.20 fasting lab visit? plz advise/thx dmf

## 2018-09-23 NOTE — Telephone Encounter (Signed)
Questions for Screening COVID-19  Symptom onset: None  Travel or Contacts: None  During this illness, did/does the patient experience any of the following symptoms? Fever >100.3F []   Yes [x]   No []   Unknown Subjective fever (felt feverish) []   Yes [x]   No []   Unknown Chills []   Yes [x]   No []   Unknown Muscle aches (myalgia) []   Yes [x]   No []   Unknown Runny nose (rhinorrhea) []   Yes [x]   No []   Unknown Sore throat []   Yes [x]   No []   Unknown Cough (new onset or worsening of chronic cough) []   Yes [x]   No []   Unknown Shortness of breath (dyspnea) []   Yes [x]   No []   Unknown Nausea or vomiting []   Yes [x]   No []   Unknown Headache []   Yes [x]   No []   Unknown Abdominal pain  []   Yes [x]   No []   Unknown Diarrhea (?3 loose/looser than normal stools/24hr period) []   Yes [x]   No []   Unknown Other, specify:  Patient risk factors: Smoker? []   Current []   Former []   Never If female, currently pregnant? []   Yes []   No  Patient Active Problem List   Diagnosis Date Noted  . Well woman exam without gynecological exam 09/23/2017  . Enterocolitis 07/14/2015  . Perforated viscus 07/14/2015  . Hyperthyroidism 12/23/2014  . Spinal stenosis, lumbar region, with neurogenic claudication 02/02/2014    Class: Chronic  . Spondylolisthesis of lumbar region 02/02/2014    Class: Chronic  . Lumbar degenerative disc disease 02/02/2014    Class: Chronic  . Obesity, Class III, BMI 40-49.9 (morbid obesity) ([place]) 02/11/2013  . Tobacco abuse 09/17/2012  . Hypertension   . Hyperlipidemia     Plan:  []   High risk for COVID-19 with red flags go to ED (with CP, SOB, weak/lightheaded, or fever > 101.5). Call ahead.  []   High risk for COVID-19 but stable. Inform provider and coordinate time for [facility] visit.   []   No red flags but URI signs or symptoms okay for [facility] visit.

## 2018-09-24 ENCOUNTER — Ambulatory Visit: Payer: Medicare HMO | Admitting: *Deleted

## 2018-09-24 ENCOUNTER — Ambulatory Visit: Payer: Medicare HMO | Admitting: Family Medicine

## 2018-09-25 ENCOUNTER — Encounter: Payer: Self-pay | Admitting: Endocrinology

## 2018-09-25 ENCOUNTER — Ambulatory Visit (INDEPENDENT_AMBULATORY_CARE_PROVIDER_SITE_OTHER): Payer: Medicare HMO | Admitting: Endocrinology

## 2018-09-25 ENCOUNTER — Other Ambulatory Visit: Payer: Self-pay

## 2018-09-25 VITALS — BP 144/80 | HR 75 | Ht 67.0 in | Wt 246.4 lb

## 2018-09-25 DIAGNOSIS — E0590 Thyrotoxicosis, unspecified without thyrotoxic crisis or storm: Secondary | ICD-10-CM | POA: Diagnosis not present

## 2018-09-25 LAB — TSH: TSH: 0.37 u[IU]/mL (ref 0.35–4.50)

## 2018-09-25 LAB — T4, FREE: Free T4: 0.84 ng/dL (ref 0.60–1.60)

## 2018-09-25 NOTE — Patient Instructions (Addendum)
Your blood pressure is high today.  Please see your primary care provider soon, to have it rechecked.   Thyroid blood tests are requested for you today.  We'll let you know about the results.   If ever you have fever while taking methimazole, stop it and call us, even if the reason is obvious, because of the risk of a rare side-effect.   Please come back for a follow-up appointment in 4-5 months.
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
## 2018-10-10 NOTE — Telephone Encounter (Signed)
Orders placed for fasting lab visit on 8.20.20/thx dmf
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
## 2018-10-22 ENCOUNTER — Encounter: Payer: Self-pay | Admitting: *Deleted

## 2018-10-22 ENCOUNTER — Ambulatory Visit (INDEPENDENT_AMBULATORY_CARE_PROVIDER_SITE_OTHER): Payer: Medicare HMO | Admitting: *Deleted

## 2018-10-22 DIAGNOSIS — Z0000 Encounter for general adult medical examination without abnormal findings: Secondary | ICD-10-CM | POA: Diagnosis not present

## 2018-10-22 NOTE — Patient Instructions (Signed)
Please schedule your next medicare wellness visit with me in 1 yr.  Continue to eat heart healthy diet (full of fruits, vegetables, whole grains, lean protein, water--limit salt, fat, and sugar intake) and increase physical activity as tolerated.  Continue doing brain stimulating activities (puzzles, reading, adult coloring books, staying active) to keep memory sharp.     Kathy Herrera , Thank you for taking time to come for your Medicare Wellness Visit. I appreciate your ongoing commitment to your health goals. Please review the following plan we discussed and let me know if I can assist you in the future.   These are the goals we discussed: Goals    . Increase water intake     Starting 08/21/2016, I will continue to drink at least 6-8 glasses of water daily.     . Start going to water aerobics once per week.        This is a list of the screening recommended for you and due dates:  Health Maintenance  Topic Date Due  . Flu Shot  10/04/2018  . Tetanus Vaccine  08/22/2026*  . Pap Smear  08/22/2019  . Mammogram  11/08/2019  . Colon Cancer Screening  01/18/2026  .  Hepatitis C: One time screening is recommended by Center for Disease Control  (CDC) for  adults born from [birth year] through 1966.   Completed  . HIV Screening  Completed  *Topic was postponed. The date shown is not the original due date.     Health Maintenance, Female Adopting a healthy lifestyle and getting preventive care are important in promoting health and wellness. Ask your health care provider about:  The right schedule for you to have regular tests and exams.  Things you can do on your own to prevent diseases and keep yourself healthy. What should I know about diet, weight, and exercise? Eat a healthy diet   Eat a diet that includes plenty of vegetables, fruits, low-fat dairy products, and lean protein.  Do not eat a lot of foods that are high in solid fats, added sugars, or sodium. Maintain a healthy weight  Body mass index (BMI) is used to identify weight problems. It estimates body fat based on height and weight. Your health care provider can help determine your BMI and help you achieve or maintain a healthy weight. Get regular exercise Get regular exercise. This is one of the most important things you can do for your health. Most adults should:  Exercise for at least 150 minutes each week. The exercise should increase your heart rate and make you sweat (moderate-intensity exercise).  Do strengthening exercises at least twice a week. This is in addition to the moderate-intensity exercise.  Spend less time sitting. Even light physical activity can be beneficial. Watch cholesterol and blood lipids Have your blood tested for lipids and cholesterol at 59 years of age, then have this test every 5 years. Have your cholesterol levels checked more often if:  Your lipid or cholesterol levels are high.  You are older than 59 years of age.  You are at high risk for heart disease. What should I know about cancer screening? Depending on your health history and family history, you may need to have cancer screening at various ages. This may include screening for:  Breast cancer.  Cervical cancer.  Colorectal cancer.  Skin cancer.  Lung cancer. What should I know about heart disease, diabetes, and high blood pressure? Blood pressure and heart disease  High blood pressure causes heart  disease and increases the risk of stroke. This is more likely to develop in people who have high blood pressure readings, are of African descent, or are overweight.  Have your blood pressure checked: ? Every 3-5 years if you are 39-2 years of age. ? Every year if you are 39 years old or older. Diabetes Have regular diabetes screenings. This checks your fasting blood sugar level. Have the screening done:  Once every three years after age 62 if you are at a normal weight and have a low risk for diabetes.  More  often and at a younger age if you are overweight or have a high risk for diabetes. What should I know about preventing infection? Hepatitis B If you have a higher risk for hepatitis B, you should be screened for this virus. Talk with your health care provider to find out if you are at risk for hepatitis B infection. Hepatitis C Testing is recommended for:  Everyone born from [birth year] through 1966.  Anyone with known risk factors for hepatitis C. Sexually transmitted infections (STIs)  Get screened for STIs, including gonorrhea and chlamydia, if: ? You are sexually active and are younger than 59 years of age. ? You are older than 59 years of age and your health care provider tells you that you are at risk for this type of infection. ? Your sexual activity has changed since you were last screened, and you are at increased risk for chlamydia or gonorrhea. Ask your health care provider if you are at risk.  Ask your health care provider about whether you are at high risk for HIV. Your health care provider may recommend a prescription medicine to help prevent HIV infection. If you choose to take medicine to prevent HIV, you should first get tested for HIV. You should then be tested every 3 months for as long as you are taking the medicine. Pregnancy  If you are about to stop having your period (premenopausal) and you may become pregnant, seek counseling before you get pregnant.  Take 400 to 800 micrograms (mcg) of folic acid every day if you become pregnant.  Ask for birth control (contraception) if you want to prevent pregnancy. Osteoporosis and menopause Osteoporosis is a disease in which the bones lose minerals and strength with aging. This can result in bone fractures. If you are 59 years old or older, or if you are at risk for osteoporosis and fractures, ask your health care provider if you should:  Be screened for bone loss.  Take a calcium or vitamin D supplement to lower your risk of  fractures.  Be given hormone replacement therapy (HRT) to treat symptoms of menopause. Follow these instructions at home: Lifestyle  Do not use any products that contain nicotine or tobacco, such as cigarettes, e-cigarettes, and chewing tobacco. If you need help quitting, ask your health care provider.  Do not use street drugs.  Do not share needles.  Ask your health care provider for help if you need support or information about quitting drugs. Alcohol use  Do not drink alcohol if: ? Your health care provider tells you not to drink. ? You are pregnant, may be pregnant, or are planning to become pregnant.  If you drink alcohol: ? Limit how much you use to 0-1 drink a day. ? Limit intake if you are breastfeeding.  Be aware of how much alcohol is in your drink. In the [place] one drink equals one 12 oz bottle of beer (355 mL), one  5 oz glass of wine (148 mL), or one 1 oz glass of hard liquor (44 mL). General instructions  Schedule regular health, dental, and eye exams.  Stay current with your vaccines.  Tell your health care provider if: ? You often feel depressed. ? You have ever been abused or do not feel safe at home. Summary  Adopting a healthy lifestyle and getting preventive care are important in promoting health and wellness.  Follow your health care provider's instructions about healthy diet, exercising, and getting tested or screened for diseases.  Follow your health care provider's instructions on monitoring your cholesterol and blood pressure. This information is not intended to replace advice given to you by your health care provider. Make sure you discuss any questions you have with your health care provider. Document Released: 09/04/2010 Document Revised: 02/12/2018 Document Reviewed: 02/12/2018 Elsevier Patient Education  2020 [employer].

## 2018-10-23 ENCOUNTER — Other Ambulatory Visit: Payer: Self-pay

## 2018-10-23 ENCOUNTER — Other Ambulatory Visit (INDEPENDENT_AMBULATORY_CARE_PROVIDER_SITE_OTHER): Payer: Medicare HMO

## 2018-10-23 DIAGNOSIS — Z01419 Encounter for gynecological examination (general) (routine) without abnormal findings: Secondary | ICD-10-CM

## 2018-10-23 DIAGNOSIS — R700 Elevated erythrocyte sedimentation rate: Secondary | ICD-10-CM | POA: Diagnosis not present

## 2018-10-23 DIAGNOSIS — E785 Hyperlipidemia, unspecified: Secondary | ICD-10-CM | POA: Diagnosis not present

## 2018-10-23 DIAGNOSIS — I10 Essential (primary) hypertension: Secondary | ICD-10-CM | POA: Diagnosis not present

## 2018-10-23 LAB — COMPREHENSIVE METABOLIC PANEL
ALT: 12 U/L (ref 0–35)
AST: 19 U/L (ref 0–37)
Albumin: 4.2 g/dL (ref 3.5–5.2)
Alkaline Phosphatase: 76 U/L (ref 39–117)
BUN: 17 mg/dL (ref 6–23)
CO2: 25 mEq/L (ref 19–32)
Calcium: 9.8 mg/dL (ref 8.4–10.5)
Chloride: 101 mEq/L (ref 96–112)
Creatinine, Ser: 1.06 mg/dL (ref 0.40–1.20)
GFR: 64.08 mL/min (ref >60.00)
Glucose, Bld: 93 mg/dL (ref 70–99)
Potassium: 3.6 mEq/L (ref 3.5–5.1)
Sodium: 137 mEq/L (ref 135–145)
Total Bilirubin: 0.5 mg/dL (ref 0.2–1.2)
Total Protein: 7.6 g/dL (ref 6.0–8.3)

## 2018-10-23 LAB — LIPID PANEL
Cholesterol: 211 mg/dL — ABNORMAL HIGH (ref 0–200)
HDL: 39.6 mg/dL (ref >39.00)
LDL Cholesterol: 137 mg/dL — ABNORMAL HIGH (ref 0–99)
NonHDL: 171.7
Total CHOL/HDL Ratio: 5
Triglycerides: 176 mg/dL — ABNORMAL HIGH (ref 0.0–149.0)
VLDL: 35.2 mg/dL (ref 0.0–40.0)

## 2018-10-23 LAB — CBC WITH DIFFERENTIAL/PLATELET
Basophils Absolute: 0.1 10*3/uL (ref 0.0–0.1)
Basophils Relative: 0.6 % (ref 0.0–3.0)
Eosinophils Absolute: 0.3 10*3/uL (ref 0.0–0.7)
Eosinophils Relative: 2.5 % (ref 0.0–5.0)
HCT: 48.7 % — ABNORMAL HIGH (ref 36.0–46.0)
Hemoglobin: 16.2 g/dL — ABNORMAL HIGH (ref 12.0–15.0)
Lymphocytes Relative: 44 % (ref 12.0–46.0)
Lymphs Abs: 4.6 10*3/uL — ABNORMAL HIGH (ref 0.7–4.0)
MCHC: 33.2 g/dL (ref 30.0–36.0)
MCV: 89.3 fl (ref 78.0–100.0)
Monocytes Absolute: 0.9 10*3/uL (ref 0.1–1.0)
Monocytes Relative: 8.4 % (ref 3.0–12.0)
Neutro Abs: 4.7 10*3/uL (ref 1.4–7.7)
Neutrophils Relative %: 44.5 % (ref 43.0–77.0)
Platelets: 226 10*3/uL (ref 150.0–400.0)
RBC: 5.45 Mil/uL — ABNORMAL HIGH (ref 3.87–5.11)
RDW: 14.9 % (ref 11.5–15.5)
WBC: 10.5 10*3/uL (ref 4.0–10.5)

## 2018-10-23 LAB — VITAMIN D 25 HYDROXY (VIT D DEFICIENCY, FRACTURES): VITD: 24.33 ng/mL — ABNORMAL LOW (ref 30.00–100.00)

## 2018-10-23 LAB — SEDIMENTATION RATE: Sed Rate: 42 mm/hr — ABNORMAL HIGH (ref 0–30)

## 2018-10-28 ENCOUNTER — Telehealth: Payer: Self-pay

## 2018-10-28 DIAGNOSIS — I2510 Atherosclerotic heart disease of native coronary artery without angina pectoris: Secondary | ICD-10-CM | POA: Insufficient documentation
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
## 2018-10-28 NOTE — Telephone Encounter (Signed)
Questions for Screening COVID-19  Symptom onset: n/a  Travel or Contacts: no  During this illness, did/does the patient experience any of the following symptoms? Fever >100.49F []   Yes [x]   No []   Unknown Subjective fever (felt feverish) []   Yes [x]   No []   Unknown Chills []   Yes [x]   No []   Unknown Muscle aches (myalgia) []   Yes [x]   No []   Unknown Runny nose (rhinorrhea) []   Yes [x]   No []   Unknown Sore throat []   Yes [x]   No []   Unknown Cough (new onset or worsening of chronic cough) []   Yes [x]   No []   Unknown Shortness of breath (dyspnea) []   Yes [x]   No []   Unknown Nausea or vomiting []   Yes [x]   No []   Unknown Headache []   Yes [x]   No []   Unknown Abdominal pain  []   Yes [x]   No []   Unknown Diarrhea (?3 loose/looser than normal stools/24hr period) []   Yes [x]   No []   Unknown Other, specify:  Patient risk factors: Smoker? []   Current []   Former []   Never If female, currently pregnant? []   Yes []   No  Patient Active Problem List   Diagnosis Date Noted  . ASCVD (arteriosclerotic cardiovascular disease) 10/28/2018  . Well woman exam without gynecological exam 09/23/2017  . Enterocolitis 07/14/2015  . Perforated viscus 07/14/2015  . Hyperthyroidism 12/23/2014  . Spinal stenosis, lumbar region, with neurogenic claudication 02/02/2014    Class: Chronic  . Spondylolisthesis of lumbar region 02/02/2014    Class: Chronic  . Lumbar degenerative disc disease 02/02/2014    Class: Chronic  . Obesity, Class III, BMI 40-49.9 (morbid obesity) ([place]) 02/11/2013  . Tobacco abuse 09/17/2012  . Hypertension   . Hyperlipidemia     Plan:  []   High risk for COVID-19 with red flags go to ED (with CP, SOB, weak/lightheaded, or fever > 101.5). Call ahead.  []   High risk for COVID-19 but stable. Inform provider and coordinate time for [facility] visit.   []   No red flags but URI signs or symptoms okay for [facility] visit.

## 2018-10-29 ENCOUNTER — Encounter: Payer: Self-pay | Admitting: Family Medicine

## 2018-10-29 ENCOUNTER — Ambulatory Visit (INDEPENDENT_AMBULATORY_CARE_PROVIDER_SITE_OTHER): Payer: Medicare HMO | Admitting: Family Medicine

## 2018-10-29 VITALS — BP 124/80 | HR 64 | Temp 98.5°F | Ht 67.0 in | Wt 249.4 lb

## 2018-10-29 DIAGNOSIS — Z716 Tobacco abuse counseling: Secondary | ICD-10-CM

## 2018-10-29 DIAGNOSIS — Z1211 Encounter for screening for malignant neoplasm of colon: Secondary | ICD-10-CM | POA: Diagnosis not present

## 2018-10-29 DIAGNOSIS — F17209 Nicotine dependence, unspecified, with unspecified nicotine-induced disorders: Secondary | ICD-10-CM

## 2018-10-29 DIAGNOSIS — I2510 Atherosclerotic heart disease of native coronary artery without angina pectoris: Secondary | ICD-10-CM | POA: Diagnosis not present

## 2018-10-29 DIAGNOSIS — E785 Hyperlipidemia, unspecified: Secondary | ICD-10-CM | POA: Diagnosis not present

## 2018-10-29 DIAGNOSIS — K50019 Crohn's disease of small intestine with unspecified complications: Secondary | ICD-10-CM | POA: Diagnosis not present

## 2018-10-29 DIAGNOSIS — E0590 Thyrotoxicosis, unspecified without thyrotoxic crisis or storm: Secondary | ICD-10-CM

## 2018-10-29 DIAGNOSIS — I10 Essential (primary) hypertension: Secondary | ICD-10-CM | POA: Diagnosis not present

## 2018-10-29 DIAGNOSIS — Z23 Encounter for immunization: Secondary | ICD-10-CM | POA: Diagnosis not present

## 2018-10-29 DIAGNOSIS — D582 Other hemoglobinopathies: Secondary | ICD-10-CM | POA: Diagnosis not present

## 2018-10-29 DIAGNOSIS — Z720 Tobacco use: Secondary | ICD-10-CM

## 2018-10-29 MED ORDER — ROSUVASTATIN CALCIUM 5 MG PO TABS: 5.0000 mg | ORAL_TABLET | Freq: Every day | ORAL | 3 refills | Status: DC

## 2018-10-29 MED ORDER — NICOTINE 14 MG/24HR TD PT24: 14.0000 mg | MEDICATED_PATCH | Freq: Every day | TRANSDERMAL | 3 refills | Status: DC

## 2018-10-29 NOTE — Assessment & Plan Note (Signed)
Likely due to long term tobacco use. Repeat CBC in 2 months. The patient indicates understanding of these issues and agrees with the plan.

## 2018-10-29 NOTE — Patient Instructions (Addendum)
Great to see you.  Are are going to quit smoking 11/04/18 (for my mama) and then start Nicoderm CQ.  How to Kick the Smoking Habit   Why should I quit smoking?  Quitting smoking is the most important thing you can do for your health. Smoking can cause cancer, lung disease, heart disease, and many other health problems. Secondhand smoke can be dangerous too. It can cause lung cancer and heart disease in adults. It can make asthma worse or cause ear infections in kids.   You'll see benefits as soon as you quit smoking. Your heart rate and blood pressure will go down. You'll breathe easier. It will be easier to exercise. Your sense of smell and taste will be better. You'll lower your risk of cancer, lung disease, and heart disease. You'll even live longer!   Why is it so hard to quit smoking?  Nicotine is a strong drug. Your body becomes addicted to nicotine when you smoke. You may have withdrawal symptoms or cravings when you stop smoking. You may become anxious or irritable. You might have trouble sleeping or want to eat more. These symptoms are usually worst the first week after quitting. The good news is nicotine withdrawal symptoms only last a few weeks for most people.  The routines and habits that go along with smoking can make it tough to quit too. Some people often smoke a cigarette when they drive, after a meal, or when they're on the phone. Smoking can become a part of these routines. After you quit smoking these habits can be a trigger to make you want to smoke again. It's important to separate smoking from these routines when you quit.  How can I make it easier to quit?  You don't have to quit "cold [place]." You can double or triple the chance that you'll stop smoking if you use a medicine and counseling together. There are many medicines available. These medicines work in different ways to help manage nicotine withdrawal. Many can be bought off the shelves at your local pharmacy. Some  require a prescription. Talk to your pharmacist or prescriber about what medicines may be right for you.  It is very important to have counseling when you quit. Medicines can help you cope with nicotine withdrawal. Counseling can help you develop skills to break smoking habits. There are lots of counseling options available. Many of these are free. Some options are local support groups, telephone quitlines, online services, and texting programs.   Start thinking now about how you plan to quit. Think about why you want to quit. Look at triggers that make you want to smoke. Plan for challenges you might face when trying to quit. Talk to your pharmacist about how to get help.   Where can I learn more?  Toll-free Quitlines and Websites:  In the [place].: 1-800-QUIT-NOW(1-641-578-2807); http://smokefree.gov    These websites include online support, live chat, and text messaging programs.     We are starting you on crestor 5 mg nightly.  Follow up in 2 months.-lab visit on 10/26 at 8 am and then you will see me on 10/28 at 8 am.

## 2018-10-29 NOTE — Assessment & Plan Note (Signed)
>  40 minutes spent in face to face time with patient, >50% spent in counselling or coordination of care discussing smoking cessation, HLD, ascvd risk score, elevated h/h.  I explained that smoking and her ethnicity, being on antihypertensives, all increase her risk of having an MI or stroke in the next 10 years.  The 10-year ASCVD risk score Mikey Bussing DC Brooke Bonito., et al., 2013) is: 14.5%   Values used to calculate the score:     Age: 58 years     Sex: Female     Is Non-Hispanic African American: Yes     Diabetic: No     Tobacco smoker: Yes     Systolic Blood Pressure: A999333 mmHg     Is BP treated: Yes     HDL Cholesterol: 39.6 mg/dL     Total Cholesterol: 211 mg/dL She agreed to start a statin- eRx sent for crestor 5 mg nightly.  She will follow up for labs in 2 months. The patient indicates understanding of these issues and agrees with the plan.

## 2018-10-29 NOTE — Assessment & Plan Note (Signed)
cilnically euthyroid on current rx.  Followed by endo.  No changes made oday.

## 2018-10-29 NOTE — Assessment & Plan Note (Signed)
Well controlled.  No changes made. 

## 2018-10-29 NOTE — Assessment & Plan Note (Signed)
Smoking cessation instruction/counseling given:  commended patient for quitting and reviewed strategies for preventing relapses. She picked a quit date of 11/04/18. She will start nicoderm cq at that time.  Discussed resources- see AVS for further details.  He has a 40 pack year history and has never really quit but after I discussed how tobacco is impacting her health- her asvd risk score, elevated h/h, etc, she agreed to quit.  The 10-year ASCVD risk score Mikey Bussing DC Brooke Bonito., et al., 2013) is: 14.5%   Values used to calculate the score:     Age: 58 years     Sex: Female     Is Non-Hispanic African American: Yes     Diabetic: No     Tobacco smoker: Yes     Systolic Blood Pressure: A999333 mmHg     Is BP treated: Yes     HDL Cholesterol: 39.6 mg/dL     Total Cholesterol: 211 mg/dL

## 2018-10-29 NOTE — Assessment & Plan Note (Signed)
Due for colonoscopy- referral placed to her GI doctor at [facility].

## 2018-11-04 ENCOUNTER — Encounter: Payer: Self-pay | Admitting: Family Medicine

## 2018-11-05 ENCOUNTER — Other Ambulatory Visit: Payer: Self-pay | Admitting: Family Medicine

## 2018-12-28 DIAGNOSIS — E559 Vitamin D deficiency, unspecified: Secondary | ICD-10-CM | POA: Insufficient documentation

## 2018-12-28 NOTE — Progress Notes (Signed)
TELEPHONE ENCOUNTER   Patient verbally agreed to telephone visit and is aware that copayment and coinsurance may apply. Patient was treated using telemedicine according to accepted telemedicine protocols.  Location of the patient: patient's home           Location of provider: physician's office Names of all persons participating in the telemedicine service and role in the encounter: Arnette Norris, MD Cleta Alberts  Subjective:   Chief Complaint  Patient presents with  . Follow-up    Pt agrees to virtual visit to F/U with smoking cessation and results from labs.     HPI   Here for follow up-  Last saw patient on 10/29/18- note reviewed   Tobacco abuse- 50 year pack year history.  Only quit once while she was in the hospital.  Has not quit any other time. After we discussed and counseled her on smoking cessation and that her high 10 year ASCVD risk score of 14.5% meant she was at increased risk of stroke or MI  and how much quitting smoking would decreasee that risk, she agreed to try smoking sessation.  She picked a quit date of 11/04/18.  Started patch she had at home (not nicoderm) but it caused a skin rash.  I did send in nicoderm CQ but has not yet picked this up yet.  Has already cut back from a carton a week toto 10 cigs per day.  She actually went three weeks without smoking.  The 10-year ASCVD risk score Mikey Bussing DC Brooke Bonito., et al., 2013) is: 9.3%   Values used to calculate the score:     Age: 58 years     Sex: Female     Is Non-Hispanic African American: Yes     Diabetic: No     Tobacco smoker: Yes     Systolic Blood Pressure: A999333 mmHg     Is BP treated: Yes     HDL Cholesterol: 44.4 mg/dL     Total Cholesterol: 138 mg/dL  HLD- she also agreed to start statin- crestor 5 mg nightly.  Cholesterol improved dramatically with low dose crestor!  Denies side effects.  Lab Results  Component Value Date   CHOL 138 12/29/2018   HDL 44.40 12/29/2018   LDLCALC 71 12/29/2018   TRIG  113.0 12/29/2018   CHOLHDL 3 12/29/2018   The 10-year ASCVD risk score Mikey Bussing DC Jr., et al., 2013) is: 9.3%   Values used to calculate the score:     Age: 58 years     Sex: Female     Is Non-Hispanic African American: Yes     Diabetic: No     Tobacco smoker: Yes     Systolic Blood Pressure: A999333 mmHg     Is BP treated: Yes     HDL Cholesterol: 44.4 mg/dL     Total Cholesterol: 138 mg/dL   CBC Latest Ref Rng & Units 12/29/2018 10/23/2018 08/21/2016  WBC 4.0 - 10.5 K/uL 8.9 10.5 10.8(H)  Hemoglobin 12.0 - 15.0 g/dL 15.4(H) 16.2(H) 16.3(H)  Hematocrit 36.0 - 46.0 % 46.8(H) 48.7(H) 48.4(H)  Platelets 150.0 - 400.0 K/uL 190.0 226.0 203.0      Patient Active Problem List   Diagnosis Date Noted  . Vitamin D deficiency 12/28/2018  . Elevated hemoglobin ([place]) 10/29/2018  . Crohn's disease of ileum, unspecified complication ([place]) [id number]  . ASCVD (arteriosclerotic cardiovascular disease) 10/28/2018  . Enterocolitis 07/14/2015  . Perforated viscus 07/14/2015  . Hyperthyroidism 12/23/2014  . Spinal stenosis, lumbar region,  with neurogenic claudication 02/02/2014    Class: Chronic  . Spondylolisthesis of lumbar region 02/02/2014    Class: Chronic  . Lumbar degenerative disc disease 02/02/2014    Class: Chronic  . Obesity, Class III, BMI 40-49.9 (morbid obesity) ([place]) 02/11/2013  . Tobacco abuse 09/17/2012  . Hypertension   . Hyperlipidemia    Social History   Tobacco Use  . Smoking status: Current Every Day Smoker    Packs/day: 1.00    Years: 40.00    Pack years: 40.00    Types: Cigarettes  . Smokeless tobacco: Never Used  Substance Use Topics  . Alcohol use: Not Currently    Comment: occassional -  holidays    Current Outpatient Medications:  .  aspirin EC 81 MG tablet, Take 81 mg by mouth daily., Disp: , Rfl:  .  atenolol (TENORMIN) 100 MG tablet, TAKE 1 TABLET EVERY DAY, Disp: 90 tablet, Rfl: 3 .  famotidine (PEPCID) 20 MG tablet, Take 1 tablet (20 mg total) by  mouth 2 (two) times daily as needed for heartburn., Disp: 30 tablet, Rfl: 0 .  hydrochlorothiazide (HYDRODIURIL) 25 MG tablet, TAKE 1 TABLET EVERY DAY, Disp: 90 tablet, Rfl: 3 .  lisinopril (ZESTRIL) 40 MG tablet, TAKE 1 TABLET EVERY DAY, Disp: 90 tablet, Rfl: 3 .  methimazole (TAPAZOLE) 5 MG tablet, TAKE 1/2 TABLET EVERY DAY, Disp: 45 tablet, Rfl: 1 .  nicotine (NICODERM CQ - DOSED IN MG/24 HOURS) 14 mg/24hr patch, Place 1 patch (14 mg total) onto the skin daily., Disp: 28 patch, Rfl: 3 .  potassium chloride (K-DUR) 10 MEQ tablet, TAKE 1 TABLET EVERY DAY, Disp: 90 tablet, Rfl: 3 .  rosuvastatin (CRESTOR) 5 MG tablet, Take 1 tablet (5 mg total) by mouth daily., Disp: 90 tablet, Rfl: 3 Allergies  Allergen Reactions  . Shrimp [Shellfish Allergy] Hives    May be seasoning not always    Assessment & Plan:   1. ASCVD (arteriosclerotic cardiovascular disease)   2. Hyperlipidemia, unspecified hyperlipidemia type   3. Tobacco abuse   4. Vitamin D deficiency     Orders Placed This Encounter  Procedures  . Lipid panel  . Comprehensive metabolic panel  . Vitamin D (25 hydroxy)   No orders of the defined types were placed in this encounter.   Arnette Norris, MD 12/31/2018  Time spent with the patient: 25 minutes, spent in obtaining information about her symptoms, reviewing her previous labs, evaluations, and treatments, counseling her about her condition (please see the discussed topics above), and developing a plan to further investigate it; she had a number of questions which I addressed.   [id number] physician/qualified health professional telephone evaluation 5 to 10 minutes [id number] physician/qualified help functional [place] evaluation for 11 to 20 minutes [id number] physician/qualify he will professional telephone evaluation for 21 to 30 minutes

## 2018-12-29 ENCOUNTER — Other Ambulatory Visit (INDEPENDENT_AMBULATORY_CARE_PROVIDER_SITE_OTHER): Payer: Medicare HMO

## 2018-12-29 ENCOUNTER — Other Ambulatory Visit: Payer: Self-pay

## 2018-12-29 DIAGNOSIS — E559 Vitamin D deficiency, unspecified: Secondary | ICD-10-CM

## 2018-12-29 DIAGNOSIS — E785 Hyperlipidemia, unspecified: Secondary | ICD-10-CM | POA: Diagnosis not present

## 2018-12-29 DIAGNOSIS — D582 Other hemoglobinopathies: Secondary | ICD-10-CM | POA: Diagnosis not present

## 2018-12-29 LAB — CBC WITH DIFFERENTIAL/PLATELET
Basophils Absolute: 0 10*3/uL (ref 0.0–0.1)
Basophils Relative: 0.6 % (ref 0.0–3.0)
Eosinophils Absolute: 0.3 10*3/uL (ref 0.0–0.7)
Eosinophils Relative: 3.5 % (ref 0.0–5.0)
HCT: 46.8 % — ABNORMAL HIGH (ref 36.0–46.0)
Hemoglobin: 15.4 g/dL — ABNORMAL HIGH (ref 12.0–15.0)
Lymphocytes Relative: 43.1 % (ref 12.0–46.0)
Lymphs Abs: 3.8 10*3/uL (ref 0.7–4.0)
MCHC: 32.9 g/dL (ref 30.0–36.0)
MCV: 89.3 fl (ref 78.0–100.0)
Monocytes Absolute: 0.7 10*3/uL (ref 0.1–1.0)
Monocytes Relative: 8.3 % (ref 3.0–12.0)
Neutro Abs: 4 10*3/uL (ref 1.4–7.7)
Neutrophils Relative %: 44.5 % (ref 43.0–77.0)
Platelets: 190 10*3/uL (ref 150.0–400.0)
RBC: 5.24 Mil/uL — ABNORMAL HIGH (ref 3.87–5.11)
RDW: 15.1 % (ref 11.5–15.5)
WBC: 8.9 10*3/uL (ref 4.0–10.5)

## 2018-12-29 LAB — COMPREHENSIVE METABOLIC PANEL
ALT: 15 U/L (ref 0–35)
AST: 21 U/L (ref 0–37)
Albumin: 4.1 g/dL (ref 3.5–5.2)
Alkaline Phosphatase: 66 U/L (ref 39–117)
BUN: 13 mg/dL (ref 6–23)
CO2: 31 mEq/L (ref 19–32)
Calcium: 9.7 mg/dL (ref 8.4–10.5)
Chloride: 101 mEq/L (ref 96–112)
Creatinine, Ser: 1.18 mg/dL (ref 0.40–1.20)
GFR: 56.59 mL/min — ABNORMAL LOW (ref >60.00)
Glucose, Bld: 86 mg/dL (ref 70–99)
Potassium: 4.4 mEq/L (ref 3.5–5.1)
Sodium: 139 mEq/L (ref 135–145)
Total Bilirubin: 0.4 mg/dL (ref 0.2–1.2)
Total Protein: 7.6 g/dL (ref 6.0–8.3)

## 2018-12-29 LAB — LIPID PANEL
Cholesterol: 138 mg/dL (ref 0–200)
HDL: 44.4 mg/dL (ref >39.00)
LDL Cholesterol: 71 mg/dL (ref 0–99)
NonHDL: 93.22
Total CHOL/HDL Ratio: 3
Triglycerides: 113 mg/dL (ref 0.0–149.0)
VLDL: 22.6 mg/dL (ref 0.0–40.0)

## 2018-12-30 ENCOUNTER — Encounter: Payer: Self-pay | Admitting: Family Medicine

## 2018-12-30 LAB — VITAMIN D 25 HYDROXY (VIT D DEFICIENCY, FRACTURES): VITD: 28.82 ng/mL — ABNORMAL LOW (ref 30.00–100.00)

## 2018-12-31 ENCOUNTER — Ambulatory Visit (INDEPENDENT_AMBULATORY_CARE_PROVIDER_SITE_OTHER): Payer: Medicare HMO | Admitting: Family Medicine

## 2018-12-31 ENCOUNTER — Other Ambulatory Visit: Payer: Self-pay

## 2018-12-31 VITALS — Ht 67.0 in | Wt 249.0 lb

## 2018-12-31 DIAGNOSIS — I2510 Atherosclerotic heart disease of native coronary artery without angina pectoris: Secondary | ICD-10-CM | POA: Diagnosis not present

## 2018-12-31 DIAGNOSIS — D582 Other hemoglobinopathies: Secondary | ICD-10-CM

## 2018-12-31 DIAGNOSIS — E559 Vitamin D deficiency, unspecified: Secondary | ICD-10-CM

## 2018-12-31 DIAGNOSIS — E785 Hyperlipidemia, unspecified: Secondary | ICD-10-CM

## 2018-12-31 DIAGNOSIS — Z720 Tobacco use: Secondary | ICD-10-CM | POA: Diagnosis not present

## 2018-12-31 NOTE — Assessment & Plan Note (Signed)
Already improved with statin! The 10-year ASCVD risk score Mikey Bussing DC Brooke Bonito., et al., 2013) is: 9.3%   Values used to calculate the score:     Age: 58 years     Sex: Female     Is Non-Hispanic African American: Yes     Diabetic: No     Tobacco smoker: Yes     Systolic Blood Pressure: A999333 mmHg     Is BP treated: Yes     HDL Cholesterol: 44.4 mg/dL     Total Cholesterol: 138 mg/dL

## 2018-12-31 NOTE — Assessment & Plan Note (Signed)
Already cutting back.  Having a rash with her OCT patch, she will pick up nicoderm and see if she has same reaction.  It does help significantly with cravings.  If she still does have rash, will discuss losenges, gum (other nicotene replacement) versus other smoking cessation aids. The patient indicates understanding of these issues and agrees with the plan.

## 2018-12-31 NOTE — Assessment & Plan Note (Signed)
Already improved significantly by cutting back on smoking.  CBC Latest Ref Rng & Units 12/29/2018 10/23/2018 08/21/2016  WBC 4.0 - 10.5 K/uL 8.9 10.5 10.8(H)  Hemoglobin 12.0 - 15.0 g/dL 15.4(H) 16.2(H) 16.3(H)  Hematocrit 36.0 - 46.0 % 46.8(H) 48.7(H) 48.4(H)  Platelets 150.0 - 400.0 K/uL 190.0 226.0 203.0

## 2018-12-31 NOTE — Assessment & Plan Note (Signed)
Improved dramatically with crestor 5 mg daily. Denies side effects. Continue current dose. The patient indicates understanding of these issues and agrees with the plan.

## 2019-02-12 ENCOUNTER — Other Ambulatory Visit: Payer: Self-pay

## 2019-02-12 DIAGNOSIS — Z1159 Encounter for screening for other viral diseases: Secondary | ICD-10-CM | POA: Diagnosis not present

## 2019-02-16 ENCOUNTER — Ambulatory Visit (INDEPENDENT_AMBULATORY_CARE_PROVIDER_SITE_OTHER): Payer: Medicare HMO | Admitting: Endocrinology

## 2019-02-16 ENCOUNTER — Other Ambulatory Visit: Payer: Self-pay

## 2019-02-16 ENCOUNTER — Encounter: Payer: Self-pay | Admitting: Endocrinology

## 2019-02-16 VITALS — BP 144/84 | HR 97 | Ht 67.0 in | Wt 253.6 lb

## 2019-02-16 DIAGNOSIS — E0590 Thyrotoxicosis, unspecified without thyrotoxic crisis or storm: Secondary | ICD-10-CM | POA: Diagnosis not present
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
## 2019-02-16 NOTE — Patient Instructions (Signed)
Your blood pressure is high today.  Please see your primary care provider soon, to have it rechecked.   Thyroid blood tests are requested for you today.  We'll let you know about the results.   If ever you have fever while taking methimazole, stop it and call us, even if the reason is obvious, because of the risk of a rare side-effect.   Please come back for a follow-up appointment in 4-5 months.

## 2019-02-17 DIAGNOSIS — K635 Polyp of colon: Secondary | ICD-10-CM | POA: Diagnosis not present

## 2019-02-17 DIAGNOSIS — K5730 Diverticulosis of large intestine without perforation or abscess without bleeding: Secondary | ICD-10-CM | POA: Diagnosis not present

## 2019-02-17 DIAGNOSIS — Z8719 Personal history of other diseases of the digestive system: Secondary | ICD-10-CM | POA: Diagnosis not present

## 2019-02-17 DIAGNOSIS — K6389 Other specified diseases of intestine: Secondary | ICD-10-CM | POA: Diagnosis not present

## 2019-02-17 DIAGNOSIS — Z86010 Personal history of colonic polyps: Secondary | ICD-10-CM | POA: Diagnosis not present

## 2019-02-17 DIAGNOSIS — K648 Other hemorrhoids: Secondary | ICD-10-CM | POA: Diagnosis not present

## 2019-02-20 DIAGNOSIS — K635 Polyp of colon: Secondary | ICD-10-CM | POA: Diagnosis not present

## 2019-02-20 DIAGNOSIS — K6389 Other specified diseases of intestine: Secondary | ICD-10-CM | POA: Diagnosis not present

## 2019-03-18 ENCOUNTER — Ambulatory Visit: Payer: Medicare HMO | Attending: Internal Medicine

## 2019-03-18 DIAGNOSIS — Z20822 Contact with and (suspected) exposure to covid-19: Secondary | ICD-10-CM

## 2019-03-19 LAB — NOVEL CORONAVIRUS, NAA: SARS-CoV-2, NAA: NOT DETECTED

## 2019-06-18 ENCOUNTER — Other Ambulatory Visit: Payer: Self-pay | Admitting: Endocrinology

## 2019-06-18 DIAGNOSIS — Z1231 Encounter for screening mammogram for malignant neoplasm of breast: Secondary | ICD-10-CM

## 2019-06-19 ENCOUNTER — Other Ambulatory Visit: Payer: Self-pay

## 2019-06-19 ENCOUNTER — Ambulatory Visit
Admission: RE | Admit: 2019-06-19 | Discharge: 2019-06-19 | Disposition: A | Discharge status: Home / Self Care | Payer: Medicare HMO | Source: Ambulatory Visit | Attending: Endocrinology | Admitting: Endocrinology

## 2019-06-19 DIAGNOSIS — Z1231 Encounter for screening mammogram for malignant neoplasm of breast: Secondary | ICD-10-CM

## 2019-07-17 ENCOUNTER — Other Ambulatory Visit: Payer: Self-pay

## 2019-07-21 ENCOUNTER — Ambulatory Visit (INDEPENDENT_AMBULATORY_CARE_PROVIDER_SITE_OTHER): Payer: Medicare HMO | Admitting: Endocrinology

## 2019-07-21 ENCOUNTER — Other Ambulatory Visit: Payer: Self-pay

## 2019-07-21 ENCOUNTER — Encounter: Payer: Self-pay | Admitting: Endocrinology

## 2019-07-21 VITALS — BP 138/80 | HR 93 | Ht 67.0 in | Wt 258.0 lb

## 2019-07-21 DIAGNOSIS — E0590 Thyrotoxicosis, unspecified without thyrotoxic crisis or storm: Secondary | ICD-10-CM

## 2019-07-21 NOTE — Patient Instructions (Signed)
Blood tests are requested for you today.  We'll let you know about the results.  If ever you have fever while taking methimazole, stop it and call us, even if the reason is obvious, because of the risk of a rare side-effect. It is best to never miss the medication.  However, if you do miss it, next best is to double up the next time.   Please come back for a follow-up appointment in 6 months.      Get Your Vaccine - All Phases Now Eligible Everyone 80 and older who wants a COVID-19 vaccine can get one through [facility].  Please review our clinic sites below for vaccine type and associated age requirement before scheduling.  [place]  ([place]) [place] [employer] [place], [place] [address] Hours: Thu: 1p-5p Type: Moderna (18+)   [place]  ([place]) [place]. [place], [place] [address] Hours: Thu-Sat 10a-5p Type: [place] (16+)  [place]  [facility]) [address] [employer] [address] [place], [place] [address] Hours: Thu-Sat 8a-12p Type: Pfizer (16+)  [place]  [facility]) [employer]. [place],  [address] Hours: Thu-Fri 8a-12p Type: Moderna (18+)  Schedule Your FREE Vaccine Appointment at [email or web address]  WALK-INS PERMITTED: Please arrive two hours before clinic closing if you do not have an appointment.  Phone Assistance: Please call [phone], Monday through Friday between 7 a.m. and 7 p.m. Other Vaccine Locations: Find all vaccine locations throughout the state of [place] at [email or web address]  WALK-INS PERMITTED: Please arrive two hours before clinic closing if you do not have an appointment.  Phone Assistance: Please call [phone], Monday through Friday between 7 a.m. and 7 p.m. Other  Vaccine Locations: Find all vaccine locations throughout the state of [place] at [email or web address]  [employer] Vaccine Following federal and state guidance, [facility] plans to offer the [employer] vaccine in weeks ahead at select locations. [place] has no scheduled clinics where the [facility] vaccine will be offered at this time. The CDC's recommendation for vaccine providers to resume use of the [employer] vaccine is available at [email or web address]
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
## 2019-07-24 ENCOUNTER — Telehealth: Payer: Self-pay | Admitting: Endocrinology

## 2019-07-24 MED ORDER — METHIMAZOLE 5 MG PO TABS: 2.5000 mg | ORAL_TABLET | Freq: Every day | ORAL | 1 refills | Status: DC

## 2019-07-24 NOTE — Telephone Encounter (Signed)
please contact patient: Please have your thyroid blood tests checked

## 2019-07-27 ENCOUNTER — Other Ambulatory Visit (INDEPENDENT_AMBULATORY_CARE_PROVIDER_SITE_OTHER): Payer: Medicare HMO

## 2019-07-27 ENCOUNTER — Other Ambulatory Visit: Payer: Self-pay

## 2019-07-27 DIAGNOSIS — E0590 Thyrotoxicosis, unspecified without thyrotoxic crisis or storm: Secondary | ICD-10-CM | POA: Diagnosis not present

## 2019-07-27 LAB — TSH: TSH: 2.13 u[IU]/mL (ref 0.35–4.50)

## 2019-07-27 LAB — T4, FREE: Free T4: 0.74 ng/dL (ref 0.60–1.60)

## 2019-07-27 NOTE — Telephone Encounter (Signed)
Called pt to schedule lab appt. Scheduled to come in today @ 9:30am. Will await results.

## 2019-09-10 ENCOUNTER — Other Ambulatory Visit: Payer: Self-pay

## 2019-09-11 ENCOUNTER — Ambulatory Visit (INDEPENDENT_AMBULATORY_CARE_PROVIDER_SITE_OTHER): Payer: Medicare HMO | Admitting: Family Medicine

## 2019-09-11 ENCOUNTER — Encounter: Payer: Self-pay | Admitting: Family Medicine

## 2019-09-11 VITALS — BP 138/78 | HR 69 | Temp 97.1°F | Ht 67.0 in | Wt 261.4 lb

## 2019-09-11 DIAGNOSIS — I10 Essential (primary) hypertension: Secondary | ICD-10-CM | POA: Diagnosis not present

## 2019-09-11 DIAGNOSIS — I2510 Atherosclerotic heart disease of native coronary artery without angina pectoris: Secondary | ICD-10-CM

## 2019-09-11 DIAGNOSIS — Z720 Tobacco use: Secondary | ICD-10-CM

## 2019-09-11 DIAGNOSIS — E785 Hyperlipidemia, unspecified: Secondary | ICD-10-CM | POA: Diagnosis not present

## 2019-09-11 DIAGNOSIS — Z0000 Encounter for general adult medical examination without abnormal findings: Secondary | ICD-10-CM

## 2019-09-11 DIAGNOSIS — L280 Lichen simplex chronicus: Secondary | ICD-10-CM | POA: Diagnosis not present

## 2019-09-11 LAB — COMPREHENSIVE METABOLIC PANEL
ALT: 11 U/L (ref 0–35)
AST: 15 U/L (ref 0–37)
Albumin: 4.2 g/dL (ref 3.5–5.2)
Alkaline Phosphatase: 69 U/L (ref 39–117)
BUN: 15 mg/dL (ref 6–23)
CO2: 31 mEq/L (ref 19–32)
Calcium: 9.6 mg/dL (ref 8.4–10.5)
Chloride: 102 mEq/L (ref 96–112)
Creatinine, Ser: 1.05 mg/dL (ref 0.40–1.20)
GFR: 64.6 mL/min (ref >60.00)
Glucose, Bld: 89 mg/dL (ref 70–99)
Potassium: 3.9 mEq/L (ref 3.5–5.1)
Sodium: 141 mEq/L (ref 135–145)
Total Bilirubin: 0.5 mg/dL (ref 0.2–1.2)
Total Protein: 7.3 g/dL (ref 6.0–8.3)

## 2019-09-11 LAB — URINALYSIS, ROUTINE W REFLEX MICROSCOPIC
Bilirubin Urine: NEGATIVE
Ketones, ur: NEGATIVE
Leukocytes,Ua: NEGATIVE
Nitrite: NEGATIVE
Specific Gravity, Urine: 1.01 (ref 1.000–1.030)
Total Protein, Urine: NEGATIVE
Urine Glucose: NEGATIVE
Urobilinogen, UA: 1 (ref 0.0–1.0)
pH: 6.5 (ref 5.0–8.0)

## 2019-09-11 LAB — LIPID PANEL
Cholesterol: 152 mg/dL (ref 0–200)
HDL: 46.7 mg/dL (ref >39.00)
LDL Cholesterol: 79 mg/dL (ref 0–99)
NonHDL: 104.98
Total CHOL/HDL Ratio: 3
Triglycerides: 130 mg/dL (ref 0.0–149.0)
VLDL: 26 mg/dL (ref 0.0–40.0)

## 2019-09-11 LAB — CBC
HCT: 50.1 % — ABNORMAL HIGH (ref 36.0–46.0)
Hemoglobin: 16.7 g/dL — ABNORMAL HIGH (ref 12.0–15.0)
MCHC: 33.3 g/dL (ref 30.0–36.0)
MCV: 91.3 fl (ref 78.0–100.0)
Platelets: 183 10*3/uL (ref 150.0–400.0)
RBC: 5.49 Mil/uL — ABNORMAL HIGH (ref 3.87–5.11)
RDW: 14.9 % (ref 11.5–15.5)
WBC: 9.5 10*3/uL (ref 4.0–10.5)

## 2019-09-11 LAB — LDL CHOLESTEROL, DIRECT: Direct LDL: 81 mg/dL

## 2019-09-11 MED ORDER — TRIAMCINOLONE ACETONIDE 0.1 % EX OINT: TOPICAL_OINTMENT | CUTANEOUS | 1 refills | Status: DC
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
## 2019-09-11 NOTE — Patient Instructions (Addendum)
Health Maintenance, Female Adopting a healthy lifestyle and getting preventive care are important in promoting health and wellness. Ask your health care provider about:  The right schedule for you to have regular tests and exams.  Things you can do on your own to prevent diseases and keep yourself healthy. What should I know about diet, weight, and exercise? Eat a healthy diet   Eat a diet that includes plenty of vegetables, fruits, low-fat dairy products, and lean protein.  Do not eat a lot of foods that are high in solid fats, added sugars, or sodium. Maintain a healthy weight Body mass index (BMI) is used to identify weight problems. It estimates body fat based on height and weight. Your health care provider can help determine your BMI and help you achieve or maintain a healthy weight. Get regular exercise Get regular exercise. This is one of the most important things you can do for your health. Most adults should:  Exercise for at least 150 minutes each week. The exercise should increase your heart rate and make you sweat (moderate-intensity exercise).  Do strengthening exercises at least twice a week. This is in addition to the moderate-intensity exercise.  Spend less time sitting. Even light physical activity can be beneficial. Watch cholesterol and blood lipids Have your blood tested for lipids and cholesterol at 60 years of age, then have this test every 5 years. Have your cholesterol levels checked more often if:  Your lipid or cholesterol levels are high.  You are older than 60 years of age.  You are at high risk for heart disease. What should I know about cancer screening? Depending on your health history and family history, you may need to have cancer screening at various ages. This may include screening for:  Breast cancer.  Cervical cancer.  Colorectal cancer.  Skin cancer.  Lung cancer. What should I know about heart disease, diabetes, and high blood  pressure? Blood pressure and heart disease  High blood pressure causes heart disease and increases the risk of stroke. This is more likely to develop in people who have high blood pressure readings, are of African descent, or are overweight.  Have your blood pressure checked: ? Every 3-5 years if you are 54-60 years of age. ? Every year if you are 93 years old or older. Diabetes Have regular diabetes screenings. This checks your fasting blood sugar level. Have the screening done:  Once every three years after age 60 if you are at a normal weight and have a low risk for diabetes.  More often and at a younger age if you are overweight or have a high risk for diabetes. What should I know about preventing infection? Hepatitis B If you have a higher risk for hepatitis B, you should be screened for this virus. Talk with your health care provider to find out if you are at risk for hepatitis B infection. Hepatitis C Testing is recommended for:  Everyone born from [birth year] through 1966.  Anyone with known risk factors for hepatitis C. Sexually transmitted infections (STIs)  Get screened for STIs, including gonorrhea and chlamydia, if: ? You are sexually active and are younger than 60 years of age. ? You are older than 60 years of age and your health care provider tells you that you are at risk for this type of infection. ? Your sexual activity has changed since you were last screened, and you are at increased risk for chlamydia or gonorrhea. Ask your health care provider if  you are at risk. °· Ask your health care provider about whether you are at high risk for HIV. Your health care provider may recommend a prescription medicine to help prevent HIV infection. If you choose to take medicine to prevent HIV, you should first get tested for HIV. You should then be tested every 3 months for as long as you are taking the medicine. °Pregnancy °· If you are about to stop having your period (premenopausal) and  you may become pregnant, seek counseling before you get pregnant. °· Take 400 to 800 micrograms (mcg) of folic acid every day if you become pregnant. °· Ask for birth control (contraception) if you want to prevent pregnancy. °Osteoporosis and menopause °Osteoporosis is a disease in which the bones lose minerals and strength with aging. This can result in bone fractures. If you are 65 years old or older, or if you are at risk for osteoporosis and fractures, ask your health care provider if you should: °· Be screened for bone loss. °· Take a calcium or vitamin D supplement to lower your risk of fractures. °· Be given hormone replacement therapy (HRT) to treat symptoms of menopause. °Follow these instructions at home: °Lifestyle °· Do not use any products that contain nicotine or tobacco, such as cigarettes, e-cigarettes, and chewing tobacco. If you need help quitting, ask your health care provider. °· Do not use street drugs. °· Do not share needles. °· Ask your health care provider for help if you need support or information about quitting drugs. °Alcohol use °· Do not drink alcohol if: °? Your health care provider tells you not to drink. °? You are pregnant, may be pregnant, or are planning to become pregnant. °· If you drink alcohol: °? Limit how much you use to 0-1 drink a day. °? Limit intake if you are breastfeeding. °· Be aware of how much alcohol is in your drink. In the [place] one drink equals one 12 oz bottle of beer (355 mL), one 5 oz glass of wine (148 mL), or one 1½ oz glass of hard liquor (44 mL). °General instructions °· Schedule regular health, dental, and eye exams. °· Stay current with your vaccines. °· Tell your health care provider if: °? You often feel depressed. °? You have ever been abused or do not feel safe at home. °Summary °· Adopting a healthy lifestyle and getting preventive care are important in promoting health and wellness. °· Follow your health care provider's instructions about healthy  diet, exercising, and getting tested or screened for diseases. °· Follow your health care provider's instructions on monitoring your cholesterol and blood pressure. °This information is not intended to replace advice given to you by your health care provider. Make sure you discuss any questions you have with your health care provider. °Document Revised: 02/12/2018 Document Reviewed: 02/12/2018 °Elsevier Patient Education © 2020 [employer] Inc[employer] ° °Preventive Care 40-64 Years Old, Female °Preventive care refers to visits with your health care provider and lifestyle choices that can promote health and wellness. This includes: °· A yearly physical exam. This may also be called an annual well check. °· Regular dental visits and eye exams. °· Immunizations. °· Screening for certain conditions. °· Healthy lifestyle choices, such as eating a healthy diet, getting regular exercise, not using drugs or products that contain nicotine and tobacco, and limiting alcohol use. °What can I expect for my preventive care visit? °Physical exam °Your health care provider will check your: °· Height and weight. This may be used   to calculate body mass index (BMI), which tells if you are at a healthy weight. °· Heart rate and blood pressure. °· Skin for abnormal spots. °Counseling °Your health care provider may ask you questions about your: °· Alcohol, tobacco, and drug use. °· Emotional well-being. °· Home and relationship well-being. °· Sexual activity. °· Eating habits. °· Work and work environment. °· Method of birth control. °· Menstrual cycle. °· Pregnancy history. °What immunizations do I need? ° °Influenza (flu) vaccine °· This is recommended every year. °Tetanus, diphtheria, and pertussis (Tdap) vaccine °· You may need a Td booster every 10 years. °Varicella (chickenpox) vaccine °· You may need this if you have not been vaccinated. °Zoster (shingles) vaccine °· You may need this after age 60. °Measles, mumps, and rubella (MMR)  vaccine °· You may need at least one dose of MMR if you were born in 1957 or later. You may also need a second dose. °Pneumococcal conjugate (PCV13) vaccine °· You may need this if you have certain conditions and were not previously vaccinated. °Pneumococcal polysaccharide (PPSV23) vaccine °· You may need one or two doses if you smoke cigarettes or if you have certain conditions. °Meningococcal conjugate (MenACWY) vaccine °· You may need this if you have certain conditions. °Hepatitis A vaccine °· You may need this if you have certain conditions or if you travel or work in places where you may be exposed to hepatitis A. °Hepatitis B vaccine °· You may need this if you have certain conditions or if you travel or work in places where you may be exposed to hepatitis B. °Haemophilus influenzae type b (Hib) vaccine °· You may need this if you have certain conditions. °Human papillomavirus (HPV) vaccine °· If recommended by your health care provider, you may need three doses over 6 months. °You may receive vaccines as individual doses or as more than one vaccine together in one shot (combination vaccines). Talk with your health care provider about the risks and benefits of combination vaccines. °What tests do I need? °Blood tests °· Lipid and cholesterol levels. These may be checked every 5 years, or more frequently if you are over 50 years old. °· Hepatitis C test. °· Hepatitis B test. °Screening °· Lung cancer screening. You may have this screening every year starting at age 55 if you have a 30-pack-year history of smoking and currently smoke or have quit within the past 15 years. °· Colorectal cancer screening. All adults should have this screening starting at age 50 and continuing until age 75. Your health care provider may recommend screening at age 45 if you are at increased risk. You will have tests every 1-10 years, depending on your results and the type of screening test. °· Diabetes screening. This is done by  checking your blood sugar (glucose) after you have not eaten for a while (fasting). You may have this done every 1-3 years. °· Mammogram. This may be done every 1-2 years. Talk with your health care provider about when you should start having regular mammograms. This may depend on whether you have a family history of breast cancer. °· BRCA-related cancer screening. This may be done if you have a family history of breast, ovarian, tubal, or peritoneal cancers. °· Pelvic exam and Pap test. This may be done every 3 years starting at age 21. Starting at age 30, this may be done every 5 years if you have a Pap test in combination with an HPV test. °Other tests °· Sexually transmitted disease (  STD) testing.  Bone density scan. This is done to screen for osteoporosis. You may have this scan if you are at high risk for osteoporosis. Follow these instructions at home: Eating and drinking  Eat a diet that includes fresh fruits and vegetables, whole grains, lean protein, and low-fat dairy.  Take vitamin and mineral supplements as recommended by your health care provider.  Do not drink alcohol if: ? Your health care provider tells you not to drink. ? You are pregnant, may be pregnant, or are planning to become pregnant.  If you drink alcohol: ? Limit how much you have to 0-1 drink a day. ? Be aware of how much alcohol is in your drink. In the [place] one drink equals one 12 oz bottle of beer (355 mL), one 5 oz glass of wine (148 mL), or one 1 oz glass of hard liquor (44 mL). Lifestyle  Take daily care of your teeth and gums.  Stay active. Exercise for at least 30 minutes on 5 or more days each week.  Do not use any products that contain nicotine or tobacco, such as cigarettes, e-cigarettes, and chewing tobacco. If you need help quitting, ask your health care provider.  If you are sexually active, practice safe sex. Use a condom or other form of birth control (contraception) in order to prevent pregnancy  and STIs (sexually transmitted infections).  If told by your health care provider, take low-dose aspirin daily starting at age 51. What's next?  Visit your health care provider once a year for a well check visit.  Ask your health care provider how often you should have your eyes and teeth checked.  Stay up to date on all vaccines. This information is not intended to replace advice given to you by your health care provider. Make sure you discuss any questions you have with your health care provider. Document Revised: 10/31/2017 Document Reviewed: 10/31/2017 Elsevier Patient Education  2020 [employer].  Steps to Quit Smoking Smoking tobacco is the leading cause of preventable death. It can affect almost every organ in the body. Smoking puts you and people around you at risk for many serious, long-lasting (chronic) diseases. Quitting smoking can be hard, but it is one of the best things that you can do for your health. It is never too late to quit. How do I get ready to quit? When you decide to quit smoking, make a plan to help you succeed. Before you quit:  Pick a date to quit. Set a date within the next 2 weeks to give you time to prepare.  Write down the reasons why you are quitting. Keep this list in places where you will see it often.  Tell your family, friends, and co-workers that you are quitting. Their support is important.  Talk with your doctor about the choices that may help you quit.  Find out if your health insurance will pay for these treatments.  Know the people, places, things, and activities that make you want to smoke (triggers). Avoid them. What first steps can I take to quit smoking?  Throw away all cigarettes at home, at work, and in your car.  Throw away the things that you use when you smoke, such as ashtrays and lighters.  Clean your car. Make sure to empty the ashtray.  Clean your home, including curtains and carpets. What can I do to help me quit  smoking? Talk with your doctor about taking medicines and seeing a counselor at the same time.  You are more likely to succeed when you do both.  If you are pregnant or breastfeeding, talk with your doctor about counseling or other ways to quit smoking. Do not take medicine to help you quit smoking unless your doctor tells you to do so. To quit smoking: Quit right away  Quit smoking totally, instead of slowly cutting back on how much you smoke over a period of time.  Go to counseling. You are more likely to quit if you go to counseling sessions regularly. Take medicine You may take medicines to help you quit. Some medicines need a prescription, and some you can buy over-the-counter. Some medicines may contain a drug called nicotine to replace the nicotine in cigarettes. Medicines may:  Help you to stop having the desire to smoke (cravings).  Help to stop the problems that come when you stop smoking (withdrawal symptoms). Your doctor may ask you to use:  Nicotine patches, gum, or lozenges.  Nicotine inhalers or sprays.  Non-nicotine medicine that is taken by mouth. Find resources Find resources and other ways to help you quit smoking and remain smoke-free after you quit. These resources are most helpful when you use them often. They include:  Online chats with a Social worker.  Phone quitlines.  Printed Furniture conservator/restorer.  Support groups or group counseling.  Text messaging programs.  Mobile phone apps. Use apps on your mobile phone or tablet that can help you stick to your quit plan. There are many free apps for mobile phones and tablets as well as websites. Examples include Quit Guide from the [employer] and smokefree.gov  What things can I do to make it easier to quit?   Talk to your family and friends. Ask them to support and encourage you.  Call a phone quitline (1-800-QUIT-NOW), reach out to support groups, or work with a Social worker.  Ask people who smoke to not smoke around  you.  Avoid places that make you want to smoke, such as: ? Bars. ? Parties. ? Smoke-break areas at work.  Spend time with people who do not smoke.  Lower the stress in your life. Stress can make you want to smoke. Try these things to help your stress: ? Getting regular exercise. ? Doing deep-breathing exercises. ? Doing yoga. ? Meditating. ? Doing a body scan. To do this, close your eyes, focus on one area of your body at a time from head to toe. Notice which parts of your body are tense. Try to relax the muscles in those areas. How will I feel when I quit smoking? Day 1 to 3 weeks Within the first 24 hours, you may start to have some problems that come from quitting tobacco. These problems are very bad 2-3 days after you quit, but they do not often last for more than 2-3 weeks. You may get these symptoms:  Mood swings.  Feeling restless, nervous, angry, or annoyed.  Trouble concentrating.  Dizziness.  Strong desire for high-sugar foods and nicotine.  Weight gain.  Trouble pooping (constipation).  Feeling like you may vomit (nausea).  Coughing or a sore throat.  Changes in how the medicines that you take for other issues work in your body.  Depression.  Trouble sleeping (insomnia). Week 3 and afterward After the first 2-3 weeks of quitting, you may start to notice more positive results, such as:  Better sense of smell and taste.  Less coughing and sore throat.  Slower heart rate.  Lower blood pressure.  Clearer skin.  Better breathing.  Fewer sick days. Quitting smoking can be hard. Do not give up if you fail the first time. Some people need to try a few times before they succeed. Do your best to stick to your quit plan, and talk with your doctor if you have any questions or concerns. Summary  Smoking tobacco is the leading cause of preventable death. Quitting smoking can be hard, but it is one of the best things that you can do for your health.  When  you decide to quit smoking, make a plan to help you succeed.  Quit smoking right away, not slowly over a period of time.  When you start quitting, seek help from your doctor, family, or friends. This information is not intended to replace advice given to you by your health care provider. Make sure you discuss any questions you have with your health care provider. Document Revised: 11/14/2018 Document Reviewed: 05/10/2018 Elsevier Patient Education  [place].

## 2019-09-12 MED ORDER — ROSUVASTATIN CALCIUM 10 MG PO TABS: 10.0000 mg | ORAL_TABLET | Freq: Every day | ORAL | 3 refills | Status: DC

## 2019-09-12 NOTE — Addendum Note (Signed)
Addended by: Jon Billings on: 09/12/2019 04:59 PM   Modules accepted: Orders

## 2019-09-17 ENCOUNTER — Other Ambulatory Visit: Payer: Self-pay | Admitting: *Deleted

## 2019-09-17 DIAGNOSIS — Z87891 Personal history of nicotine dependence: Secondary | ICD-10-CM

## 2019-09-17 DIAGNOSIS — F17210 Nicotine dependence, cigarettes, uncomplicated: Secondary | ICD-10-CM

## 2019-09-23 ENCOUNTER — Encounter (INDEPENDENT_AMBULATORY_CARE_PROVIDER_SITE_OTHER): Payer: Self-pay | Admitting: Bariatrics

## 2019-09-23 ENCOUNTER — Other Ambulatory Visit: Payer: Self-pay

## 2019-09-23 ENCOUNTER — Ambulatory Visit (INDEPENDENT_AMBULATORY_CARE_PROVIDER_SITE_OTHER): Payer: Medicare HMO | Admitting: Bariatrics

## 2019-09-23 VITALS — BP 140/84 | HR 60 | Temp 98.6°F | Ht 66.0 in | Wt 251.0 lb

## 2019-09-23 DIAGNOSIS — E038 Other specified hypothyroidism: Secondary | ICD-10-CM | POA: Diagnosis not present

## 2019-09-23 DIAGNOSIS — Z6841 Body Mass Index (BMI) 40.0 and over, adult: Secondary | ICD-10-CM

## 2019-09-23 DIAGNOSIS — E66813 Obesity, class 3: Secondary | ICD-10-CM

## 2019-09-23 DIAGNOSIS — E7849 Other hyperlipidemia: Secondary | ICD-10-CM

## 2019-09-23 DIAGNOSIS — I2510 Atherosclerotic heart disease of native coronary artery without angina pectoris: Secondary | ICD-10-CM | POA: Diagnosis not present

## 2019-09-23 DIAGNOSIS — R7309 Other abnormal glucose: Secondary | ICD-10-CM | POA: Diagnosis not present

## 2019-09-23 DIAGNOSIS — Z9189 Other specified personal risk factors, not elsewhere classified: Secondary | ICD-10-CM

## 2019-09-23 DIAGNOSIS — I10 Essential (primary) hypertension: Secondary | ICD-10-CM | POA: Diagnosis not present

## 2019-09-23 DIAGNOSIS — Z1331 Encounter for screening for depression: Secondary | ICD-10-CM | POA: Diagnosis not present

## 2019-09-23 DIAGNOSIS — Z0289 Encounter for other administrative examinations: Secondary | ICD-10-CM

## 2019-09-23 DIAGNOSIS — R5383 Other fatigue: Secondary | ICD-10-CM

## 2019-09-23 DIAGNOSIS — E559 Vitamin D deficiency, unspecified: Secondary | ICD-10-CM

## 2019-09-23 DIAGNOSIS — R0602 Shortness of breath: Secondary | ICD-10-CM | POA: Diagnosis not present

## 2019-09-23 DIAGNOSIS — K50019 Crohn's disease of small intestine with unspecified complications: Secondary | ICD-10-CM

## 2019-09-23 NOTE — Progress Notes (Signed)
Dear Dr. Abelino Derrick,   Thank you for referring Arnisha Laffoon Spearman to our clinic. The following note includes my evaluation and treatment recommendations.  Chief Complaint:   Kathy Herrera (MR# [id number]) is a 59 y.o. female who presents for evaluation and treatment of obesity and related comorbidities. Current BMI is Body mass index is 40.51 kg/m.Marland Kitchen Letzy has been struggling with her weight for many years and has been unsuccessful in either losing weight, maintaining weight loss, or reaching her healthy weight goal.  Dawnisha is currently in the action stage of change and ready to dedicate time achieving and maintaining a healthier weight. Shonya is interested in becoming our patient and working on intensive lifestyle modifications including (but not limited to) diet and exercise for weight loss.  Delphia is lactose intolerant. She likes to cook. She craves salty/sweet foods.  Kamilah's habits were reviewed today and are as follows: Her family eats meals together, she thinks her family will eat healthier with her, her desired weight loss is 81 lbs, she has been heavy most of her life, she started gaining weight after the birth of her last child, her heaviest weight ever was 251 pounds, she craves salty/sweet foods, she snacks sometimes in the evenings, she skips lunch  daily, she is frequently drinking liquids with calories and she frequently makes poor food choices.  Depression Screen Jamonica's Food and Mood (modified PHQ-9) score was 6.  Depression screen [facility] 2/9 09/23/2019  Decreased Interest 3  Down, Depressed, Hopeless 1  PHQ - 2 Score 4  Altered sleeping 0  Tired, decreased energy 2  Change in appetite 0  Feeling bad or failure about yourself  0  Trouble concentrating 0  Moving slowly or fidgety/restless 0  Suicidal thoughts 0  PHQ-9 Score 6  Difficult doing work/chores Somewhat difficult   Subjective:   Other fatigue. Kennadi denies daytime somnolence and admits to waking  up still tired. Sequoia generally gets 6 hours of sleep per night, and states that she does not sleep well most nights. Snoring is not present. Apneic episodes are not present. Epworth Sleepiness Score is 6.  SOB (shortness of breath) on exertion. Ahliyah notes increasing shortness of breath with certain activities and seems to be worsening over time with weight gain. She notes getting out of breath sooner with activity than she used to. This has gotten worse recently. Lutricia denies shortness of breath at rest or orthopnea.  Other hyperlipidemia. Angelis is taking Crestor.   Lab Results  Component Value Date   CHOL 152 09/11/2019   HDL 46.70 09/11/2019   LDLCALC 79 09/11/2019   LDLDIRECT 81.0 09/11/2019   TRIG 130.0 09/11/2019   CHOLHDL 3 09/11/2019   Lab Results  Component Value Date   ALT 11 09/11/2019   AST 15 09/11/2019   ALKPHOS 69 09/11/2019   BILITOT 0.5 09/11/2019   The 10-year ASCVD risk score Mikey Bussing DC Jr., et al., 2013) is: 14.5%   Values used to calculate the score:     Age: 60 years     Sex: Female     Is Non-Hispanic African American: Yes     Diabetic: No     Tobacco smoker: Yes     Systolic Blood Pressure: 193 mmHg     Is BP treated: Yes     HDL Cholesterol: 46.7 mg/dL     Total Cholesterol: 152 mg/dL  Vitamin D deficiency. Kamalani is taking OTC Vitamin D 1,000 IU.   Ref. Range 12/29/2018  16:51  VITD Latest Ref Range: 30.00 - 100.00 ng/mL 28.82 (L)   Essential hypertension. Dalonda is taking atenolol, HCTZ, and lisinopril.  BP Readings from Last 3 Encounters:  09/23/19 140/84  09/11/19 138/78  07/21/19 138/80   Lab Results  Component Value Date   CREATININE 1.05 09/11/2019   CREATININE 1.18 12/29/2018   CREATININE 1.06 10/23/2018   ASCVD (arteriosclerotic cardiovascular disease). See medications for cholesterol and hypertension.  Crohn's disease of ileum with complication ([place]). Lillianah states Crohn's is in remission. She is on no medication.  Other specified  hypothyroidism. Symphanie is on Tapazole.  Lab Results  Component Value Date   TSH 2.13 07/27/2019   Elevated glucose. Glucose was elevated at 93 on 10/23/2018; decreased to 86 on 12/29/2018.  Depression screening. Tamica had a mildly positive depression screen with a PHQ-9 score of 6.  At risk for activity intolerance. Archie is at risk of exercise intolerance due to fatigue and shortness of breath.  Assessment/Plan:   Other fatigue. Tatiyanna does feel that her weight is causing her energy to be lower than it should be. Fatigue may be related to obesity, depression or many other causes. Labs will be ordered, and in the meanwhile, Alysha will focus on self care including making healthy food choices, increasing physical activity and focusing on stress reduction. EKG 12-Lead performed today.  SOB (shortness of breath) on exertion. Concetta does feel that she gets out of breath more easily that she used to when she exercises. Sarely's shortness of breath appears to be obesity related and exercise induced. She has agreed to work on weight loss and gradually increase exercise to treat her exercise induced shortness of breath. Will continue to monitor closely.  Other hyperlipidemia. Cardiovascular risk and specific lipid/LDL goals reviewed.  We discussed several lifestyle modifications today and Koraline will continue to work on diet, exercise and weight loss efforts. Orders and follow up as documented in patient record. She will continue her medication as directed.   Counseling Intensive lifestyle modifications are the first line treatment for this issue. . Dietary changes: Increase soluble fiber. Decrease simple carbohydrates. . Exercise changes: Moderate to vigorous-intensity aerobic activity 150 minutes per week if tolerated. . Lipid-lowering medications: see documented in medical record.  Vitamin D deficiency. Low Vitamin D level contributes to fatigue and are associated with obesity, breast, and colon  cancer. VITAMIN D 25 Hydroxy (Vit-D Deficiency, Fractures) level will be checked today.  Essential hypertension. Pegah is working on healthy weight loss and exercise to improve blood pressure control. We will watch for signs of hypotension as she continues her lifestyle modifications. She will continue her medications as directed.  ASCVD (arteriosclerotic cardiovascular disease). Kjirsten will follow-up with her PCP as scheduled and as directed.  Crohn's disease of ileum with complication ([place]). Beth will follow-up with her PCP as scheduled and as directed.  Other specified hypothyroidism. Patient with long-standing hypothyroidism, on levothyroxine therapy. She appears euthyroid. Orders and follow up as documented in patient record. Alina will follow-up with Endocrinology as scheduled and as directed.  Counseling . Good thyroid control is important for overall health. Supratherapeutic thyroid levels are dangerous and will not improve weight loss results. . The correct way to take levothyroxine is fasting, with water, separated by at least 30 minutes from breakfast, and separated by more than 4 hours from calcium, iron, multivitamins, acid reflux medications (PPIs).   Elevated glucose. Hemoglobin A1c, Insulin, random levels will be checked today.  Depression screening. Milanna had a positive depression  screening. Depression is commonly associated with obesity and often results in emotional eating behaviors. We will monitor this closely and work on CBT to help improve the non-hunger eating patterns. Referral to Psychology may be required if no improvement is seen as she continues in our clinic.  At risk for activity intolerance. Chloey was given approximately 15 minutes of exercise intolerance counseling today. She is 59 y.o. female and has risk factors exercise intolerance including obesity. We discussed intensive lifestyle modifications today with an emphasis on specific weight loss instructions and  strategies. Aydan will slowly increase activity as tolerated.  Repetitive spaced learning was employed today to elicit superior memory formation and behavioral change.  Class 3 severe obesity with serious comorbidity and body mass index (BMI) of 40.0 to 44.9 in adult, unspecified obesity type ([place]).  Derricka is currently in the action stage of change and her goal is to continue with weight loss efforts. I recommend Paylin begin the structured treatment plan as follows:  She has agreed to the Category 2 Plan.  She will work on meal planning, intentional eating, and will stop all sugary drinks.  We reviewed with the patient labs from 09/11/2019 including CMP, lipids, CBC, and glucose.  Exercise goals: All adults should avoid inactivity. Some physical activity is better than none, and adults who participate in any amount of physical activity gain some health benefits.   Behavioral modification strategies: increasing lean protein intake, decreasing simple carbohydrates, increasing vegetables, increasing water intake, decreasing eating out, no skipping meals, meal planning and cooking strategies, keeping healthy foods in the home and planning for success.  She was informed of the importance of frequent follow-up visits to maximize her success with intensive lifestyle modifications for her multiple health conditions. She was informed we would discuss her lab results at her next visit unless there is a critical issue that needs to be addressed sooner. Briggitte agreed to keep her next visit at the agreed upon time to discuss these results.  Objective:   Blood pressure 140/84, pulse 60, temperature 98.6 F (37 C), height 5\' 6"  (1.676 m), weight 251 lb (113.9 kg), SpO2 98 %. Body mass index is 40.51 kg/m.  EKG: Sinus  Rhythm with a rate of 66 BPM. Left axis - anterior fascicular block. Diffuse nonspecific T-abnormality. Otherwise normal.  Indirect Calorimeter completed today shows a VO2 of 228 and a REE  of 1590.  Her calculated basal metabolic rate is 0626 thus her basal metabolic rate is worse than expected.  General: Cooperative, alert, well developed, in no acute distress. HEENT: Conjunctivae and lids unremarkable. Cardiovascular: Regular rhythm.  Lungs: Normal work of breathing. Neurologic: No focal deficits.   Lab Results  Component Value Date   CREATININE 1.05 09/11/2019   BUN 15 09/11/2019   NA 141 09/11/2019   K 3.9 09/11/2019   CL 102 09/11/2019   CO2 31 09/11/2019   Lab Results  Component Value Date   ALT 11 09/11/2019   AST 15 09/11/2019   ALKPHOS 69 09/11/2019   BILITOT 0.5 09/11/2019   No results found for: HGBA1C No results found for: INSULIN Lab Results  Component Value Date   TSH 2.13 07/27/2019   Lab Results  Component Value Date   CHOL 152 09/11/2019   HDL 46.70 09/11/2019   LDLCALC 79 09/11/2019   LDLDIRECT 81.0 09/11/2019   TRIG 130.0 09/11/2019   CHOLHDL 3 09/11/2019   Lab Results  Component Value Date   WBC 9.5 09/11/2019   HGB 16.7 (  H) 09/11/2019   HCT 50.1 (H) 09/11/2019   MCV 91.3 09/11/2019   PLT 183.0 09/11/2019   No results found for: IRON, TIBC, FERRITIN  Attestation Statements:   Reviewed by clinician on day of visit: allergies, medications, problem list, medical history, surgical history, family history, social history, and previous encounter notes.  Migdalia Dk, am acting as Location manager for [employer], DO   I have reviewed the above documentation for accuracy and completeness, and I agree with the above. Jearld Lesch, DO

## 2019-09-24 ENCOUNTER — Encounter (INDEPENDENT_AMBULATORY_CARE_PROVIDER_SITE_OTHER): Payer: Self-pay | Admitting: Bariatrics

## 2019-09-24 LAB — HEMOGLOBIN A1C
Est. average glucose Bld gHb Est-mCnc: 111 mg/dL
Hgb A1c MFr Bld: 5.5 % (ref 4.8–5.6)

## 2019-09-24 LAB — VITAMIN D 25 HYDROXY (VIT D DEFICIENCY, FRACTURES): Vit D, 25-Hydroxy: 33.3 ng/mL (ref 30.0–100.0)

## 2019-09-24 LAB — INSULIN, RANDOM: INSULIN: 14.6 u[IU]/mL (ref 2.6–24.9)

## 2019-10-07 ENCOUNTER — Ambulatory Visit (INDEPENDENT_AMBULATORY_CARE_PROVIDER_SITE_OTHER): Payer: Commercial Managed Care - HMO | Admitting: Bariatrics

## 2019-10-20 NOTE — Progress Notes (Signed)
Shared Decision Making Visit Lung Cancer Screening Program [phone])   Eligibility:  Age 59 y.o.  Pack Years Smoking History Calculation 40 pack year smoking history (# packs/per year x # years smoked)  Recent History of coughing up blood  no  Unexplained weight loss? no ( >Than 15 pounds within the last 6 months )  Prior History Lung / other cancer no (Diagnosis within the last 5 years already requiring surveillance chest CT Scans).  Smoking Status Current Smoker  Former Smokers: Years since quit: NA  Quit Date: NA  Visit Components:  Discussion included one or more decision making aids. yes  Discussion included risk/benefits of screening. yes  Discussion included potential follow up diagnostic testing for abnormal scans. yes  Discussion included meaning and risk of over diagnosis. yes  Discussion included meaning and risk of False Positives. yes  Discussion included meaning of total radiation exposure. yes  Counseling Included:  Importance of adherence to annual lung cancer LDCT screening. yes  Impact of comorbidities on ability to participate in the program. yes  Ability and willingness to under diagnostic treatment. yes  Smoking Cessation Counseling:  Current Smokers:   Discussed importance of smoking cessation. yes  Information about tobacco cessation classes and interventions provided to patient. yes  Patient provided with "ticket" for LDCT Scan. yes  Symptomatic Patient. no  CounselingNA  Diagnosis Code: Tobacco Use Z72.0  Asymptomatic Patient yes  Counseling (Intermediate counseling: > three minutes counseling) Q1194  Former Smokers:   Discussed the importance of maintaining cigarette abstinence. yes  Diagnosis Code: Personal History of Nicotine Dependence. R74.081  Information about tobacco cessation classes and interventions provided to patient. Yes  Patient provided with "ticket" for LDCT Scan. yes  Written Order for Lung Cancer  Screening with LDCT placed in Epic. Yes (CT Chest Lung Cancer Screening Low Dose W/O CM) [id number] Z12.2-Screening of respiratory organs Z87.891-Personal history of nicotine dependence  I have spent 25 minutes of face to face time with Kathy Herrera  discussing the risks and benefits of lung cancer screening. We viewed a power point together that explained in detail the above noted topics. We paused at intervals to allow for questions to be asked and answered to ensure understanding.We discussed that the single most powerful action that she can take to decrease her risk of developing lung cancer is to quit smoking. We discussed whether or not she is ready to commit to setting a quit date. We discussed options for tools to aid in quitting smoking including nicotine replacement therapy, non-nicotine medications, support groups, Quit Smart classes, and behavior modification. We discussed that often times setting smaller, more achievable goals, such as eliminating 1 cigarette a day for a week and then 2 cigarettes a day for a week can be helpful in slowly decreasing the number of cigarettes smoked. This allows for a sense of accomplishment as well as providing a clinical benefit. I gave her the " Be Stronger Than Your Excuses" card with contact information for community resources, classes, free nicotine replacement therapy, and access to mobile apps, text messaging, and on-line smoking cessation help. I have also given her my card and contact information in the event she needs to contact me. We discussed the time and location of the scan, and that either Kathy Glassman RN or I will call with the results within 24-48 hours of receiving them. I have offered her  a copy of the power point we viewed  as a resource in the event they need reinforcement  of the concepts we discussed today in the office. The patient verbalized understanding of all of  the above and had no further questions upon leaving the office. They have my  contact information in the event they have any further questions.  I spent 3 minutes counseling on smoking cessation and the health risks of continued tobacco abuse.  I explained to the patient that there has been a high incidence of coronary artery disease noted on these exams. I explained that this is a non-gated exam therefore degree or severity cannot be determined. This patient is on statin therapy. I have asked the patient to follow-up with their PCP regarding any incidental finding of coronary artery disease and management with diet or medication as their PCP  feels is clinically indicated. The patient verbalized understanding of the above and had no further questions upon completion of the visit.   Pt. Does not want pharmacy appointment to discuss smoking cessation at this time. I have given her our number to call if she changes her mind.     Magdalen Spatz, NP 10/21/2019

## 2019-10-21 ENCOUNTER — Ambulatory Visit (INDEPENDENT_AMBULATORY_CARE_PROVIDER_SITE_OTHER): Payer: Medicare HMO | Admitting: Acute Care

## 2019-10-21 ENCOUNTER — Ambulatory Visit (INDEPENDENT_AMBULATORY_CARE_PROVIDER_SITE_OTHER)
Admission: RE | Admit: 2019-10-21 | Discharge: 2019-10-21 | Disposition: A | Discharge status: Home / Self Care | Payer: Medicare HMO | Source: Ambulatory Visit | Attending: Acute Care | Admitting: Acute Care

## 2019-10-21 ENCOUNTER — Encounter: Payer: Self-pay | Admitting: Acute Care

## 2019-10-21 ENCOUNTER — Other Ambulatory Visit: Payer: Self-pay

## 2019-10-21 VITALS — BP 130/70 | HR 79 | Temp 96.5°F | Ht 67.0 in | Wt 255.6 lb

## 2019-10-21 DIAGNOSIS — F17210 Nicotine dependence, cigarettes, uncomplicated: Secondary | ICD-10-CM | POA: Diagnosis not present

## 2019-10-21 DIAGNOSIS — Z87891 Personal history of nicotine dependence: Secondary | ICD-10-CM

## 2019-10-21 NOTE — Patient Instructions (Signed)
Thank you for participating in the [facility]. It was our pleasure to meet you today. We will call you with the results of your scan within the next few days. Your scan will be assigned a Lung RADS category score by the physicians reading the scans.  This Lung RADS score determines follow up scanning.  See below for description of categories, and follow up screening recommendations. We will be in touch to schedule your follow up screening annually or based on recommendations of our providers. We will fax a copy of your scan results to your Primary Care Physician, or the physician who referred you to the program, to ensure they have the results. Please call the office if you have any questions or concerns regarding your scanning experience or results.  Our office number is [phone]. Please speak with Denise Phelps, RN. She is our Lung Cancer Screening RN. If she is unavailable when you call, please have the office staff send her a message. She will return your call at her earliest convenience. Remember, if your scan is normal, we will scan you annually as long as you continue to meet the criteria for the program. (Age 55-77, Current smoker or smoker who has quit within the last 15 years). If you are a smoker, remember, quitting is the single most powerful action that you can take to decrease your risk of lung cancer and other pulmonary, breathing related problems. We know quitting is hard, and we are here to help.  Please let us know if there is anything we can do to help you meet your goal of quitting. If you are a former smoker, congratulations. We are proud of you! Remain smoke free! Remember you can refer friends or family members through the number above.  We will screen them to make sure they meet criteria for the program. Thank you for helping us take better care of you by participating in Lung Screening.  Lung RADS Categories:  Lung RADS 1: no nodules  or definitely non-concerning nodules.  Recommendation is for a repeat annual scan in 12 months.  Lung RADS 2:  nodules that are non-concerning in appearance and behavior with a very low likelihood of becoming an active cancer. Recommendation is for a repeat annual scan in 12 months.  Lung RADS 3: nodules that are probably non-concerning , includes nodules with a low likelihood of becoming an active cancer.  Recommendation is for a 6-month repeat screening scan. Often noted after an upper respiratory illness. We will be in touch to make sure you have no questions, and to schedule your 6-month scan.  Lung RADS 4 A: nodules with concerning findings, recommendation is most often for a follow up scan in 3 months or additional testing based on our provider's assessment of the scan. We will be in touch to make sure you have no questions and to schedule the recommended 3 month follow up scan.  Lung RADS 4 B:  indicates findings that are concerning. We will be in touch with you to schedule additional diagnostic testing based on our provider's  assessment of the scan.   

## 2019-10-22 NOTE — Progress Notes (Signed)
Please call patient and let them  know their  low dose Ct was read as a Lung RADS 2: nodules that are benign in appearance and behavior with a very low likelihood of becoming a clinically active cancer due to size or lack of growth. Recommendation per radiology is for a repeat LDCT in 12 months. .Please let them  know we will order and schedule their  annual screening scan for 10/2020. Please let them  know there was notation of CAD on their  scan.  Please remind the patient  that this is a non-gated exam therefore degree or severity of disease  cannot be determined. Please have them  follow up with their PCP regarding potential risk factor modification, dietary therapy or pharmacologic therapy if clinically indicated. Pt.  is  currently on statin therapy. Please place order for annual  screening scan for 10/2020 and fax results to PCP. Thanks so much.  Langley Gauss, this patient has CAD advanced for her age . She is followed by Dr. Acie Fredrickson, but has not been in awhile. She may need to follow up. Please just mention this to her. Thanks so much

## 2019-10-26 ENCOUNTER — Other Ambulatory Visit: Payer: Self-pay | Admitting: *Deleted

## 2019-10-26 DIAGNOSIS — Z87891 Personal history of nicotine dependence: Secondary | ICD-10-CM

## 2019-10-26 DIAGNOSIS — F17210 Nicotine dependence, cigarettes, uncomplicated: Secondary | ICD-10-CM
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
## 2019-10-28 ENCOUNTER — Other Ambulatory Visit: Payer: Self-pay

## 2019-10-28 ENCOUNTER — Telehealth: Payer: Self-pay

## 2019-10-28 ENCOUNTER — Ambulatory Visit (INDEPENDENT_AMBULATORY_CARE_PROVIDER_SITE_OTHER): Payer: Medicare HMO

## 2019-10-28 VITALS — Temp 97.7°F | Ht 67.0 in | Wt 257.4 lb

## 2019-10-28 DIAGNOSIS — Z0000 Encounter for general adult medical examination without abnormal findings: Secondary | ICD-10-CM | POA: Diagnosis not present

## 2019-10-28 NOTE — Patient Instructions (Signed)
Kathy Herrera , Thank you for taking time to come for your Medicare Wellness Visit. I appreciate your ongoing commitment to your health goals. Please review the following plan we discussed and let me know if I can assist you in the future.   Screening recommendations/referrals: Colonoscopy: Completed 02/17/2019-Due 02/16/2029 Mammogram: Completed 06/19/2019- Due 06/18/2020 Bone Density: Not yet indicated Recommended yearly ophthalmology/optometry visit for glaucoma screening and checkup Recommended yearly dental visit for hygiene and checkup  Vaccinations: Influenza vaccine: Due 11/2019 Pneumococcal vaccine: Not yet indicated Tdap vaccine: Discuss with pharmacy Shingles vaccine: Discuss with pharmacy Covid-19: Completed vacines  Advanced directives: Discussed. Information obtained previously.  Conditions/risks identified: See problem list  Next appointment: Follow up in one year for your annual wellness visit.   Preventive Care 40-64 Years, Female Preventive care refers to lifestyle choices and visits with your health care provider that can promote health and wellness. What does preventive care include?  A yearly physical exam. This is also called an annual well check.  Dental exams once or twice a year.  Routine eye exams. Ask your health care provider how often you should have your eyes checked.  Personal lifestyle choices, including:  Daily care of your teeth and gums.  Regular physical activity.  Eating a healthy diet.  Avoiding tobacco and drug use.  Limiting alcohol use.  Practicing safe sex.  Taking low-dose aspirin daily starting at age 45.  Taking vitamin and mineral supplements as recommended by your health care provider. What happens during an annual well check? The services and screenings done by your health care provider during your annual well check will depend on your age, overall health, lifestyle risk factors, and family history of disease. Counseling   Your health care provider may ask you questions about your:  Alcohol use.  Tobacco use.  Drug use.  Emotional well-being.  Home and relationship well-being.  Sexual activity.  Eating habits.  Work and work Statistician.  Method of birth control.  Menstrual cycle.  Pregnancy history. Screening  You may have the following tests or measurements:  Height, weight, and BMI.  Blood pressure.  Lipid and cholesterol levels. These may be checked every 5 years, or more frequently if you are over 35 years old.  Skin check.  Lung cancer screening. You may have this screening every year starting at age 68 if you have a 30-pack-year history of smoking and currently smoke or have quit within the past 15 years.  Fecal occult blood test (FOBT) of the stool. You may have this test every year starting at age 57.  Flexible sigmoidoscopy or colonoscopy. You may have a sigmoidoscopy every 5 years or a colonoscopy every 10 years starting at age 81.  Hepatitis C blood test.  Hepatitis B blood test.  Sexually transmitted disease (STD) testing.  Diabetes screening. This is done by checking your blood sugar (glucose) after you have not eaten for a while (fasting). You may have this done every 1-3 years.  Mammogram. This may be done every 1-2 years. Talk to your health care provider about when you should start having regular mammograms. This may depend on whether you have a family history of breast cancer.  BRCA-related cancer screening. This may be done if you have a family history of breast, ovarian, tubal, or peritoneal cancers.  Pelvic exam and Pap test. This may be done every 3 years starting at age 43. Starting at age 35, this may be done every 5 years if you have a Pap test  in combination with an HPV test.  Bone density scan. This is done to screen for osteoporosis. You may have this scan if you are at high risk for osteoporosis. Discuss your test results, treatment options, and if  necessary, the need for more tests with your health care provider. Vaccines  Your health care provider may recommend certain vaccines, such as:  Influenza vaccine. This is recommended every year.  Tetanus, diphtheria, and acellular pertussis (Tdap, Td) vaccine. You may need a Td booster every 10 years.  Zoster vaccine. You may need this after age 57.  Pneumococcal 13-valent conjugate (PCV13) vaccine. You may need this if you have certain conditions and were not previously vaccinated.  Pneumococcal polysaccharide (PPSV23) vaccine. You may need one or two doses if you smoke cigarettes or if you have certain conditions. Talk to your health care provider about which screenings and vaccines you need and how often you need them. This information is not intended to replace advice given to you by your health care provider. Make sure you discuss any questions you have with your health care provider. Document Released: 03/18/2015 Document Revised: 11/09/2015 Document Reviewed: 12/21/2014 Elsevier Interactive Patient Education  2017 [place] Prevention in the Home Falls can cause injuries. They can happen to people of all ages. There are many things you can do to make your home safe and to help prevent falls. What can I do on the outside of my home?  Regularly fix the edges of walkways and driveways and fix any cracks.  Remove anything that might make you trip as you walk through a door, such as a raised step or threshold.  Trim any bushes or trees on the path to your home.  Use bright outdoor lighting.  Clear any walking paths of anything that might make someone trip, such as rocks or tools.  Regularly check to see if handrails are loose or broken. Make sure that both sides of any steps have handrails.  Any raised decks and porches should have guardrails on the edges.  Have any leaves, snow, or ice cleared regularly.  Use sand or salt on walking paths during winter.   Clean up any spills in your garage right away. This includes oil or grease spills. What can I do in the bathroom?  Use night lights.  Install grab bars by the toilet and in the tub and shower. Do not use towel bars as grab bars.  Use non-skid mats or decals in the tub or shower.  If you need to sit down in the shower, use a plastic, non-slip stool.  Keep the floor dry. Clean up any water that spills on the floor as soon as it happens.  Remove soap buildup in the tub or shower regularly.  Attach bath mats securely with double-sided non-slip rug tape.  Do not have throw rugs and other things on the floor that can make you trip. What can I do in the bedroom?  Use night lights.  Make sure that you have a light by your bed that is easy to reach.  Do not use any sheets or blankets that are too big for your bed. They should not hang down onto the floor.  Have a firm chair that has side arms. You can use this for support while you get dressed.  Do not have throw rugs and other things on the floor that can make you trip. What can I do in the kitchen?  Clean up any  spills right away.  Avoid walking on wet floors.  Keep items that you use a lot in easy-to-reach places.  If you need to reach something above you, use a strong step stool that has a grab bar.  Keep electrical cords out of the way.  Do not use floor polish or wax that makes floors slippery. If you must use wax, use non-skid floor wax.  Do not have throw rugs and other things on the floor that can make you trip. What can I do with my stairs?  Do not leave any items on the stairs.  Make sure that there are handrails on both sides of the stairs and use them. Fix handrails that are broken or loose. Make sure that handrails are as long as the stairways.  Check any carpeting to make sure that it is firmly attached to the stairs. Fix any carpet that is loose or worn.  Avoid having throw rugs at the top or bottom of the  stairs. If you do have throw rugs, attach them to the floor with carpet tape.  Make sure that you have a light switch at the top of the stairs and the bottom of the stairs. If you do not have them, ask someone to add them for you. What else can I do to help prevent falls?  Wear shoes that:  Do not have high heels.  Have rubber bottoms.  Are comfortable and fit you well.  Are closed at the toe. Do not wear sandals.  If you use a stepladder:  Make sure that it is fully opened. Do not climb a closed stepladder.  Make sure that both sides of the stepladder are locked into place.  Ask someone to hold it for you, if possible.  Clearly mark and make sure that you can see:  Any grab bars or handrails.  First and last steps.  Where the edge of each step is.  Use tools that help you move around (mobility aids) if they are needed. These include:  Canes.  Walkers.  Scooters.  Crutches.  Turn on the lights when you go into a dark area. Replace any light bulbs as soon as they burn out.  Set up your furniture so you have a clear path. Avoid moving your furniture around.  If any of your floors are uneven, fix them.  If there are any pets around you, be aware of where they are.  Review your medicines with your doctor. Some medicines can make you feel dizzy. This can increase your chance of falling. Ask your doctor what other things that you can do to help prevent falls. This information is not intended to replace advice given to you by your health care provider. Make sure you discuss any questions you have with your health care provider. Document Released: 12/16/2008 Document Revised: 07/28/2015 Document Reviewed: 03/26/2014 Elsevier Interactive Patient Education  2017 [employer].

## 2019-10-28 NOTE — Telephone Encounter (Signed)
During Medicare Wellness visit, patient complained of insomnia. She is having trouble falling asleep & she wakes up very early also. She is wanting to know if something can be called in to help her sleep. She wants it sent to [facility] ([place] [place] - [address].

## 2019-10-29 ENCOUNTER — Encounter: Payer: Self-pay | Admitting: Obstetrics and Gynecology

## 2019-10-29 ENCOUNTER — Ambulatory Visit (INDEPENDENT_AMBULATORY_CARE_PROVIDER_SITE_OTHER): Payer: Medicare HMO | Admitting: Obstetrics and Gynecology

## 2019-10-29 VITALS — BP 126/82 | Ht 66.0 in | Wt 257.0 lb

## 2019-10-29 DIAGNOSIS — Z01419 Encounter for gynecological examination (general) (routine) without abnormal findings: Secondary | ICD-10-CM | POA: Diagnosis not present

## 2019-10-29 DIAGNOSIS — N6452 Nipple discharge: Secondary | ICD-10-CM | POA: Diagnosis not present

## 2019-10-29 DIAGNOSIS — Z13820 Encounter for screening for osteoporosis: Secondary | ICD-10-CM

## 2019-10-29 DIAGNOSIS — Z124 Encounter for screening for malignant neoplasm of cervix: Secondary | ICD-10-CM

## 2019-10-29 DIAGNOSIS — H5203 Hypermetropia, bilateral: Secondary | ICD-10-CM | POA: Diagnosis not present

## 2019-10-29 LAB — PROLACTIN: Prolactin: 4.4 ng/mL

## 2019-10-29 NOTE — Progress Notes (Signed)
[place] 25-Aug-1960 [id number]  SUBJECTIVE:  59 y.o. G74P4 female new patient here for a breast and pelvic exam and Pap smear.  She does report having bilateral whitish nipple discharge that she can express but no spontaneous drainage, no bloody discharge.  This has been noted since she had her last child almost 30 years ago.  She has no gynecologic concerns.  Current Outpatient Medications  Medication Sig Dispense Refill  . aspirin EC 81 MG tablet Take 81 mg by mouth daily.    Marland Kitchen atenolol (TENORMIN) 100 MG tablet TAKE 1 TABLET EVERY DAY 90 tablet 3  . hydrochlorothiazide (HYDRODIURIL) 25 MG tablet TAKE 1 TABLET EVERY DAY 90 tablet 3  . lisinopril (ZESTRIL) 40 MG tablet TAKE 1 TABLET EVERY DAY 90 tablet 3  . methimazole (TAPAZOLE) 5 MG tablet Take 0.5 tablets (2.5 mg total) by mouth daily. 45 tablet 1  . potassium chloride (K-DUR) 10 MEQ tablet TAKE 1 TABLET EVERY DAY 90 tablet 3  . rosuvastatin (CRESTOR) 10 MG tablet Take 1 tablet (10 mg total) by mouth daily. 90 tablet 3  . triamcinolone ointment (KENALOG) 0.1 % Apply daily to rash on nape only as needed. 30 g 1  . VITAMIN D PO Take by mouth.    . famotidine (PEPCID) 20 MG tablet Take 1 tablet (20 mg total) by mouth 2 (two) times daily as needed for heartburn. (Patient not taking: Reported on 10/29/2019) 30 tablet 0   No current facility-administered medications for this visit.   Allergies: Shrimp [shellfish allergy]  No LMP recorded. Patient has had a hysterectomy.  Past medical history,surgical history, problem list, medications, allergies, family history and social history were all reviewed and documented as reviewed in the EPIC chart.  GYN ROS: no abnormal bleeding, pelvic pain or discharge, no breast pain or new or enlarging lumps on self exam.  + Whitish nipple discharge on expression.  No dysuria, frequency, burning, pain with urination, cloudy/malodorous urine.   OBJECTIVE:  BP 126/82 (Cuff Size: Large)   Ht 5\' 6"  (1.676  m)   Wt 257 lb (116.6 kg)   BMI 41.48 kg/m  The patient appears well, alert, oriented, in no distress.  BREAST EXAM: pendulous breasts appear normal, no suspicious masses, no skin or nipple changes or axillary nodes  PELVIC EXAM: VULVA: normal appearing vulva with atrophic changes, no masses, tenderness or lesions, VAGINA: normal appearing vagina with trophic changes, normal color and discharge, no lesions, CERVIX: Atrophic changes, normal in appearance grossly, UTERUS: surgically absent, ADNEXA: no masses, nontender, PAP: Pap smear done today, thin-prep method  Chaperone: Aurora Mask (DNP student) present during the examination and performed the pelvic exam with me in attendance to confirm the exam findings.  Caryn Bee present during the examination.  ASSESSMENT:  59 y.o. G4P4 here for a breast and pelvic exam  PLAN:   1. Postmenopausal. Prior supracervical hysterectomy and BSO and 2001 elsewhere for heavy menstrual periods and what sounds to be small fibroids and ovarian torsion.  No HRT.  Hot flashes manageable.  No vaginal bleeding. 2. Pap smear 2018.  Still has a cervix.  No significant history of abnormal Pap smears.  Pap smear is collected today. 3. Mammogram 06/2019.  Normal breast exam today.  She will continue with annual mammograms.  She did bring up the nipple discharge today so we will check a prolactin.   4. Colonoscopy 2020.  She will follow up at the interval recommended by her GI specialist.   5.  DEXA never.  Recently had normal vitamin D level.  She says she has lost some height in the past few years.  We discussed the rationale for doing DEXA scan to screen for osteoporosis and she agrees to get this scheduled.  The test is ordered. 6. Health maintenance.  Routine screening labs were recently completed with her primary care doctor.  Will go to lab today to check prolactin as above.   Return annually or sooner, prn.  Joseph Pierini MD 10/29/19

## 2019-10-29 NOTE — Addendum Note (Signed)
Addended by: Nelva Nay on: 10/29/2019 10:53 AM   Modules accepted: Orders

## 2019-10-30 ENCOUNTER — Telehealth (INDEPENDENT_AMBULATORY_CARE_PROVIDER_SITE_OTHER): Payer: Medicare HMO | Admitting: Family Medicine

## 2019-10-30 ENCOUNTER — Encounter: Payer: Self-pay | Admitting: Family Medicine

## 2019-10-30 VITALS — Temp 97.0°F | Ht 66.0 in | Wt 257.0 lb

## 2019-10-30 DIAGNOSIS — F5101 Primary insomnia: Secondary | ICD-10-CM | POA: Diagnosis not present

## 2019-10-30 DIAGNOSIS — M069 Rheumatoid arthritis, unspecified: Secondary | ICD-10-CM | POA: Diagnosis not present

## 2019-10-30 DIAGNOSIS — E785 Hyperlipidemia, unspecified: Secondary | ICD-10-CM | POA: Diagnosis not present

## 2019-10-30 DIAGNOSIS — I10 Essential (primary) hypertension: Secondary | ICD-10-CM

## 2019-10-30 DIAGNOSIS — I2510 Atherosclerotic heart disease of native coronary artery without angina pectoris: Secondary | ICD-10-CM | POA: Diagnosis not present

## 2019-10-30 LAB — PAP IG W/ RFLX HPV ASCU

## 2019-10-30 MED ORDER — POTASSIUM CHLORIDE CRYS ER 10 MEQ PO TBCR: 10.0000 meq | EXTENDED_RELEASE_TABLET | Freq: Every day | ORAL | 0 refills | Status: DC

## 2019-10-30 MED ORDER — HYDROCHLOROTHIAZIDE 25 MG PO TABS: 25.0000 mg | ORAL_TABLET | Freq: Every day | ORAL | 0 refills | Status: DC

## 2019-10-30 MED ORDER — TRAZODONE HCL 50 MG PO TABS: 25.0000 mg | ORAL_TABLET | Freq: Every evening | ORAL | 1 refills | Status: DC | PRN

## 2019-10-30 MED ORDER — LISINOPRIL 40 MG PO TABS: 40.0000 mg | ORAL_TABLET | Freq: Every day | ORAL | 0 refills | Status: DC

## 2019-10-30 MED ORDER — ATENOLOL 100 MG PO TABS: 100.0000 mg | ORAL_TABLET | Freq: Every day | ORAL | 0 refills | Status: DC
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
## 2019-12-28 ENCOUNTER — Other Ambulatory Visit: Payer: Self-pay | Admitting: Family Medicine

## 2019-12-28 DIAGNOSIS — I10 Essential (primary) hypertension: Secondary | ICD-10-CM

## 2019-12-28 DIAGNOSIS — I2510 Atherosclerotic heart disease of native coronary artery without angina pectoris: Secondary | ICD-10-CM

## 2020-01-18 ENCOUNTER — Other Ambulatory Visit: Payer: Self-pay | Admitting: Family Medicine

## 2020-01-18 DIAGNOSIS — L280 Lichen simplex chronicus: Secondary | ICD-10-CM

## 2020-01-19 NOTE — Telephone Encounter (Signed)
Last VV 10/30/19 Last fill 09/11/19  #30g/1

## 2020-01-21 ENCOUNTER — Ambulatory Visit: Payer: Medicare HMO | Admitting: Endocrinology

## 2020-01-22 ENCOUNTER — Other Ambulatory Visit: Payer: Self-pay

## 2020-01-22 ENCOUNTER — Ambulatory Visit (INDEPENDENT_AMBULATORY_CARE_PROVIDER_SITE_OTHER): Payer: Medicare HMO | Admitting: Endocrinology

## 2020-01-22 ENCOUNTER — Encounter: Payer: Self-pay | Admitting: Endocrinology

## 2020-01-22 VITALS — BP 138/86 | HR 80 | Ht 67.0 in | Wt 259.0 lb

## 2020-01-22 DIAGNOSIS — E0590 Thyrotoxicosis, unspecified without thyrotoxic crisis or storm: Secondary | ICD-10-CM | POA: Diagnosis not present

## 2020-01-22 LAB — TSH: TSH: 6.81 u[IU]/mL — ABNORMAL HIGH (ref 0.35–4.50)

## 2020-01-22 LAB — T4, FREE: Free T4: 0.59 ng/dL — ABNORMAL LOW (ref 0.60–1.60)

## 2020-01-22 MED ORDER — METHIMAZOLE 5 MG PO TABS: 2.5000 mg | ORAL_TABLET | ORAL | 1 refills | Status: DC

## 2020-01-22 NOTE — Patient Instructions (Signed)
Blood tests are requested for you today.  We'll let you know about the results.  If ever you have fever while taking methimazole, stop it and call us, even if the reason is obvious, because of the risk of a rare side-effect. It is best to never miss the medication.  However, if you do miss it, next best is to double up the next time.   Please come back for a follow-up appointment in 6 months.
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
## 2020-03-09 ENCOUNTER — Other Ambulatory Visit: Payer: Self-pay | Admitting: Family Medicine

## 2020-03-09 DIAGNOSIS — L280 Lichen simplex chronicus: Secondary | ICD-10-CM

## 2020-04-15 ENCOUNTER — Telehealth: Payer: Self-pay | Admitting: Family Medicine

## 2020-04-15 ENCOUNTER — Other Ambulatory Visit: Payer: Self-pay

## 2020-04-15 NOTE — Telephone Encounter (Signed)
Patient is calling to speak to the nurse regarding her stomach pain. She has an appointment on 02/14, but wants to know what she can take over the weekend because the pain is so bad. She states it's from her Crohn's Disease. Please call her at [phone] and advise.

## 2020-04-15 NOTE — Telephone Encounter (Signed)
Spoke with patient who states that she have had stomach pains for a few days now not sure what to do about the pain. No other symptoms. Patient asking if she could take Tylenol OTC patient verbally understood this would be fine to take also advised patient if symptoms become worse she will need to go to nearest ED or urgent care.

## 2020-04-18 ENCOUNTER — Ambulatory Visit (INDEPENDENT_AMBULATORY_CARE_PROVIDER_SITE_OTHER): Payer: Medicare HMO | Admitting: Family Medicine

## 2020-04-18 ENCOUNTER — Encounter: Payer: Self-pay | Admitting: Family Medicine

## 2020-04-18 ENCOUNTER — Other Ambulatory Visit: Payer: Self-pay

## 2020-04-18 VITALS — BP 142/90 | HR 80 | Temp 97.8°F | Ht 67.0 in | Wt 265.6 lb

## 2020-04-18 DIAGNOSIS — K50019 Crohn's disease of small intestine with unspecified complications: Secondary | ICD-10-CM | POA: Diagnosis not present

## 2020-04-18 DIAGNOSIS — M1612 Unilateral primary osteoarthritis, left hip: Secondary | ICD-10-CM | POA: Diagnosis not present

## 2020-04-18 MED ORDER — PREDNISONE 10 MG PO TABS: ORAL_TABLET | ORAL | 0 refills | Status: DC

## 2020-04-18 MED ORDER — HYDROCODONE-ACETAMINOPHEN 10-325 MG PO TABS: 1.0000 | ORAL_TABLET | Freq: Three times a day (TID) | ORAL | 0 refills | Status: AC | PRN

## 2020-04-18 NOTE — Progress Notes (Signed)
[facility] [address] [place] [address] Dept: [phone] Dept Fax: [phone]  Acute Office Visit  Subjective:    Patient ID: Kathy Herrera, female    DOB: Dec 30, 1960, 60 y.o..   MRN: [id number]  Chief Complaint  Patient presents with  . Acute Visit    Pt c/o stomach pain x5 days. Denies any changes in diet, pt states she does have chromes and thinks this could be a flare up.     History of Present Illness:  Patient is in today with a 4-5 day history of left abdominal pain. She has a history of Crohn's disease. She is not currently on any specific treatment for this. Her last flare was 4 years ago and required hospitalization due to obstruction. She was recommended to start Humira, but was unable to afford this.  She notes her pain now is very similar to her last flare. She denies any fever. She had a bout of diarrhea and a single episode of vomiting this morning. Otherwise, she has been eating well. She notes she is UTD on her colonoscopy. She has not seen her GI specialist in some time.  Additionally, she notes increasing problems with her left hip. She states this frequently pops and clicks. She feels like it comes in and out of joint at times. She esp. Has issues getting out the car and occasionally while driving. She had this previously evaluated by Dr. Louanne Skye with [facility]. He had told her she would eventually needa hip joint replacement.  Past Medical History: Patient Active Problem List   Diagnosis Date Noted  . Arthritis of left hip 04/18/2020  . LSC (lichen simplex chronicus) 09/11/2019  . Vitamin D deficiency 12/28/2018  . Elevated hemoglobin ([place]) 10/29/2018  . Crohn's disease of ileum, unspecified complication ([place]) 67/89/3810  . ASCVD (arteriosclerotic cardiovascular disease) 10/28/2018  . Healthcare maintenance 08/21/2016  . Enterocolitis 07/14/2015  . Perforated viscus 07/14/2015  .  Hyperthyroidism 12/23/2014  . Spinal stenosis, lumbar region, with neurogenic claudication 02/02/2014    Class: Chronic  . Spondylolisthesis of lumbar region 02/02/2014    Class: Chronic  . Lumbar degenerative disc disease 02/02/2014    Class: Chronic  . Morbid obesity ([place]) 02/11/2013  . Tobacco abuse 09/17/2012  . Essential hypertension   . Hyperlipidemia     Past Surgical History:  Procedure Laterality Date  . ABDOMINAL HYSTERECTOMY     partial- still has cervix.  Marland Kitchen arm surgery Left   . CHOLECYSTECTOMY    . LUMBAR FUSION N/A 02/02/2014   Procedure: CENTRAL LAMINECTOMY L4-5,L5-S1, TRANSFORAMINAL LUMBAR INTERBODY FUSION L4-5,L5-S1, RODS AND SCREW FIX  AND L5-S1, LOCAL BONE GRAFT;  Surgeon: Jessy Oto, MD;  Location: [facility];  Service: Orthopedics;  Laterality: N/A;  . TONSILLECTOMY AND ADENOIDECTOMY      Family History  Problem Relation Age of Onset  . Cancer Mother 86       cervical  . CVA Mother   . Hypertension Mother   . Diabetes Mother   . Obesity Mother   . High Cholesterol Mother   . Thyroid disease Daughter   . Diabetes Daughter   . Breast cancer Neg Hx     Outpatient Medications Prior to Visit  Medication Sig Dispense Refill  . aspirin EC 81 MG tablet Take 81 mg by mouth daily.    Marland Kitchen atenolol (TENORMIN) 100 MG tablet TAKE 1 TABLET EVERY DAY 90 tablet 0  . hydrochlorothiazide (HYDRODIURIL) 25 MG  tablet TAKE 1 TABLET EVERY DAY 90 tablet 0  . lisinopril (ZESTRIL) 40 MG tablet TAKE 1 TABLET EVERY DAY 90 tablet 0  . methimazole (TAPAZOLE) 5 MG tablet Take 0.5 tablets (2.5 mg total) by mouth 3 (three) times a week. 20 tablet 1  . potassium chloride (KLOR-CON) 10 MEQ tablet TAKE 1 TABLET EVERY DAY 90 tablet 0  . rosuvastatin (CRESTOR) 10 MG tablet Take 1 tablet (10 mg total) by mouth daily. 90 tablet 3  . traZODone (DESYREL) 50 MG tablet Take 0.5-1 tablets (25-50 mg total) by mouth at bedtime as needed for sleep. 30 tablet 1  . triamcinolone ointment (KENALOG) 0.1 %  APPLY DAILY TO RASH ON NAPE ONLY AS NEEDED. 30 g 1  . VITAMIN D PO Take by mouth.    . famotidine (PEPCID) 20 MG tablet Take 1 tablet (20 mg total) by mouth 2 (two) times daily as needed for heartburn. (Patient not taking: Reported on 04/18/2020) 30 tablet 0   No facility-administered medications prior to visit.   Allergies  Allergen Reactions  . Shrimp [Shellfish Allergy] Hives    May be seasoning not always   Objective:   Today's Vitals   04/18/20 0811  BP: (!) 142/90  Pulse: 80  Temp: 97.8 F (36.6 C)  TempSrc: Temporal  SpO2: 96%  Weight: 265 lb 9.6 oz (120.5 kg)  Height: 5\' 7"  (1.702 m)   Body mass index is 41.6 kg/m.   General: Well developed, well nourished. No acute distress. Abdomen: Patient has difficulty lying down due to pain towards her left flank. He abdomen is soft. There is pain in the   LLQ and left lateral abdomen. No hepatosplenomegaly. No rebound or guarding. Extremities: Neg. SLR on the left. External rotation normal wihtotu pain. Patient indicates pain with abduction of the   hip. Psych: Alert and oriented. Normal mood and affect.  Health Maintenance Due  Topic Date Due  . PAP SMEAR-Modifier  08/22/2019  . INFLUENZA VACCINE  10/04/2019  . COVID-19 Vaccine (3 - Booster) 04/07/2020     Assessment & Plan:   1. Crohn's disease of ileum, unspecified complication ([place]) Kathy Herrera appears to be having a flare of her Crohn's disease. I will start her on a steroid taper and plan to reassess in 1 week. If symptoms of obstruction develop or her pain worsens, she should present to the ED. She likely needs follow-up with GI to discuss long-term management options.  - predniSONE (DELTASONE) 10 MG tablet; Take 4 tablets (40 mg total) by mouth daily with breakfast for 7 days, THEN 3 tablets (30 mg total) daily with breakfast for 7 days, THEN 2 tablets (20 mg total) daily with breakfast for 7 days, THEN 1 tablet (10 mg total) daily with breakfast for 7 days, THEN 0.5  tablets (5 mg total) daily with breakfast for 7 days.  Dispense: 73.5 tablet; Refill: 0 - HYDROcodone-acetaminophen (NORCO) 10-325 MG tablet; Take 1 tablet by mouth every 8 (eight) hours as needed for up to 5 days.  Dispense: 15 tablet; Refill: 0  2. Arthritis of left hip Ms. Ketner has left hip arthritis by history. I will refer her back to Dr. Louanne Skye for evaluation and to discuss potential left total hip joint replacement.  - Ambulatory referral to Orthopedic Surgery  Haydee Salter, MD

## 2020-04-18 NOTE — Patient Instructions (Signed)
Crohn's Disease  Crohn's disease is a long-lasting (chronic) disease that affects the gastrointestinal (GI) tract. Crohn's disease often causes irritation and inflammation in the small intestine and the beginning of the large intestine, but it can affect any part of the GI tract. Crohn's disease is part of a group of illnesses that are known as inflammatory bowel disease (IBD). Crohn's disease may start slowly and get worse over time. Symptoms may come and go. They may also go away for months or even years at a time (remission). What are the causes? The exact cause of this condition is not known. It may involve a response that causes your body's disease-fighting (immune) system to attack healthy cells and tissues (autoimmune response). Bacteria, genes, and your environment may also play a role. What increases the risk? The following factors may make you more likely to develop this condition:  Having a family member who has Crohn's disease, another IBD, or an autoimmune condition.  Using products that contain nicotine or tobacco, such as cigarettes and e-cigarettes.  Being in your 20s.  Having Eastern European ancestry. What are the signs or symptoms? The main symptoms of this condition involve your GI tract. These include:  Diarrhea.  Pain or cramping in the abdomen commonly felt in the lower right side of the abdomen.  Frequent watery or bloody stools.  Constipation. This may mean having: ? Fewer bowel movements in a week than normal. ? Difficulty having a bowel movement. ? Stools that are dry, hard, or larger than normal.  Rectal bleeding.  Rectal pain.  An urgent need to have a bowel movement.  The feeling that you are not finished having a bowel movement. Other symptoms may include:  Unexplained weight loss.  Fatigue.  Fever.  Nausea or appetite loss.  Joint pain.  Vision changes.  Red bumps or sores on the skin.  Sores inside the mouth. How is this  diagnosed? This condition may be diagnosed based on:  Your symptoms and medical history.  A physical exam.  Tests, which may include: ? Blood tests. ? Stool sample tests. ? Imaging tests, such as X-rays and CT scans. ? Tests to examine the inside of your intestines using a long, flexible tube that has a light and a camera on the end (colonoscopy). ? A procedure to remove tissue samples from inside your bowel for testing (biopsy). You may need to work with a health care provider who specializes in diseases of the digestive tract (gastroenterologist). How is this treated? There is no cure for this condition, and it affects each person differently. Treatment can help you manage your symptoms. Your treatment may include:  Resting your bowels. This involves having a period of healing time when your bowels are not passing stools. This may be done by: ? Drinking only clear liquids. These are liquids that you can see through, such as water, black coffee, fruit juice without pulp, broth, gelatin, and ice pops. ? Getting nutrition through an IV for a period of time.  Medicines. These may be used by themselves or with other treatments (combination therapy). You may be given medicines that help to: ? Reduce inflammation. ? Control your immune system activity. ? Fight infections. ? Relieve cramps and prevent diarrhea. ? Control your pain.  Surgery. You may need surgery if: ? Medicines and other treatments are not working anymore. ? You develop complications from severe Crohn's disease. ? A section of your intestine becomes so damaged that it needs to be removed. Follow these   instructions at home: Medicines  Take over-the-counter and prescription medicines only as told by your health care provider.  If you were prescribed an antibiotic, take it as told by your health care provider. Do not stop taking the antibiotic even if you start to feel better.  Avoid taking ibuprofen or other NSAID  medicines if possible, these can make Crohn's disease worse. Eating and drinking  Talk with your health care provider or a diet and nutrition specialist (registered dietitian) about what diet is best for you.  Drink enough fluid to keep your urine pale yellow.  If you are taking steroids to reduce inflammation, get plenty of calcium in your diet to help keep your bones healthy. You may also consider taking a calcium supplement with vitamin D.  Keep a food diary to identify foods that make your symptoms better or worse, and avoid foods that cause symptoms.  Follow instructions from your health care provider about eating or drinking restrictions if you have worsening symptoms (flare-up).  Limit alcohol intake to no more than 1 drink a day for nonpregnant women and 2 drinks a day for men. One drink equals 12 oz of beer, 5 oz of wine, or 1 oz of hard liquor. General instructions  Make sure you get all the vaccines that your health care provider recommends, especially pneumonia (pneumococcal) and flu (influenza) vaccines.  Do not use any products that contain nicotine or tobacco, such as cigarettes and e-cigarettes. If you need help quitting, ask your health care provider.  Exercise every day, or as often told by your health care provider.  Keep all follow-up visits as told by your health care provider. This is important. Contact a health care provider if:  You have diarrhea, cramps in your abdomen, and other GI problems that are present almost all the time.  Your symptoms do not improve with treatment.  You continue to lose weight.  You develop a rash or sores on your skin.  You develop eye problems.  You have a fever.  Your symptoms get worse or you develop new symptoms. Get help right away if:  You have bloody diarrhea.  You have severe pain in your abdomen.  You cannot pass stools. Summary  Crohn's disease affects each person differently. The cause of this condition is  not known, but it may involve a response that causes your body's immune system to attack healthy cells and tissues.  There are multiple treatment options that can help you manage the condition. Talk with your health care provider or diet and nutrition specialist (registered dietitian) about what diet is best for you.  Make sure you get all the vaccines that your health care provider recommends, especially pneumonia (pneumococcal) and flu (influenza) vaccines. This information is not intended to replace advice given to you by your health care provider. Make sure you discuss any questions you have with your health care provider. Document Revised: 10/22/2019 Document Reviewed: 10/22/2019 Elsevier Patient Education  2021 Elsevier Inc.  

## 2020-04-22 ENCOUNTER — Ambulatory Visit: Payer: Self-pay

## 2020-04-22 ENCOUNTER — Encounter: Payer: Self-pay | Admitting: Specialist

## 2020-04-22 ENCOUNTER — Other Ambulatory Visit: Payer: Self-pay | Admitting: Family Medicine

## 2020-04-22 ENCOUNTER — Ambulatory Visit: Payer: Medicare HMO | Admitting: Specialist

## 2020-04-22 ENCOUNTER — Other Ambulatory Visit: Payer: Self-pay

## 2020-04-22 VITALS — BP 153/85 | HR 79 | Ht 67.0 in | Wt 265.0 lb

## 2020-04-22 DIAGNOSIS — M5136 Other intervertebral disc degeneration, lumbar region: Secondary | ICD-10-CM | POA: Diagnosis not present

## 2020-04-22 DIAGNOSIS — M4807 Spinal stenosis, lumbosacral region: Secondary | ICD-10-CM

## 2020-04-22 DIAGNOSIS — M25552 Pain in left hip: Secondary | ICD-10-CM | POA: Diagnosis not present

## 2020-04-22 DIAGNOSIS — Z4889 Encounter for other specified surgical aftercare: Secondary | ICD-10-CM

## 2020-04-22 DIAGNOSIS — M4316 Spondylolisthesis, lumbar region: Secondary | ICD-10-CM | POA: Diagnosis not present

## 2020-04-22 DIAGNOSIS — M1612 Unilateral primary osteoarthritis, left hip: Secondary | ICD-10-CM

## 2020-04-22 DIAGNOSIS — I2510 Atherosclerotic heart disease of native coronary artery without angina pectoris: Secondary | ICD-10-CM

## 2020-04-22 DIAGNOSIS — I10 Essential (primary) hypertension: Secondary | ICD-10-CM

## 2020-04-22 MED ORDER — TRAMADOL-ACETAMINOPHEN 37.5-325 MG PO TABS: 1.0000 | ORAL_TABLET | Freq: Four times a day (QID) | ORAL | 0 refills | Status: DC | PRN

## 2020-04-22 NOTE — Patient Instructions (Addendum)
Avoid bending, stooping and avoid lifting weights greater than 10 lbs. Avoid prolong standing and walking. Avoid frequent bending and stooping  No lifting greater than 10 lbs. May use ice or moist heat for pain. Weight loss is of benefit. Handicap license is approved. Dr. Romona Curls secretary/Assistant will call to arrange for left hip for intraarticular injection to assess which is the major source of pain spine or hip.

## 2020-04-22 NOTE — Progress Notes (Signed)
Office Visit Note   Patient: Kathy Herrera           Date of Birth: Jul 16, 1960           MRN: [id number] Visit Date: 04/22/2020              Requested by: Libby Maw, MD [address] [place],  [place] [address] PCP: Libby Maw, MD   Assessment & Plan: Visit Diagnoses:  1. Pain in left hip   2. Lumbar degenerative disc disease     Plan: Avoid bending, stooping and avoid lifting weights greater than 10 lbs. Avoid prolong standing and walking. Avoid frequent bending and stooping  No lifting greater than 10 lbs. May use ice or moist heat for pain. Weight loss is of benefit. Handicap license is approved. Dr. Romona Curls secretary/Assistant will call to arrange for left hip for intraarticular injection to assess which is the major source of pain spine or hip.   Orders:  Orders Placed This Encounter  Procedures  . XR Lumbar Spine 2-3 Views  . XR HIP UNILAT W OR W/O PELVIS 2-3 VIEWS LEFT   No orders of the defined types were placed in this encounter.     Procedures: No procedures performed   Clinical Data: No additional findings.   Subjective: Chief Complaint  Patient presents with  . Lower Back - Pain  . Left Hip - Pain    59 year old female with 6 month history of left hip pain pain with getting out of the car, difficulty going up and down stairs, no difficulty reaching shoes or socks. Has history of Crohn's disease and NSAIDs tend to irritate the crohns. She is unable to walk a mile and has  Difficulty walking a 1/2 block. She has to use a buggy with grocery shopping and after getting to the first aisle she has to find a place to site. Just the one leg she feels a pop and she can be walking the left leg will give out.The left hip is popping and grinding.   Review of Systems  Constitutional: Negative.   HENT: Negative.   Eyes: Positive for visual disturbance (bifocals).  Respiratory: Positive for shortness of breath (with walking)  and wheezing. Negative for apnea, cough, choking, chest tightness and stridor.   Cardiovascular: Negative.  Negative for chest pain, palpitations and leg swelling.  Gastrointestinal: Negative.  Negative for abdominal distention, abdominal pain, anal bleeding, blood in stool, constipation, diarrhea, nausea and rectal pain.  Endocrine: Negative.   Genitourinary: Negative.   Musculoskeletal: Positive for back pain (it is doing pretty good, post op 2016 opr3) and gait problem. Negative for joint swelling, myalgias, neck pain and neck stiffness.  Skin: Negative.   Allergic/Immunologic: Negative for environmental allergies, food allergies and immunocompromised state.  Neurological: Positive for weakness. Negative for dizziness, tremors, seizures, syncope, facial asymmetry, speech difficulty, light-headedness, numbness and headaches.  Hematological: Negative.   Psychiatric/Behavioral: Negative for agitation, behavioral problems, confusion, decreased concentration, dysphoric mood, hallucinations, self-injury, sleep disturbance and suicidal ideas. The patient is not nervous/anxious and is not hyperactive.      Objective: Vital Signs: BP (!) 153/85   Pulse 79   Ht 5\' 7"  (1.702 m)   Wt 265 lb (120.2 kg)   BMI 41.50 kg/m   Physical Exam Constitutional:      Appearance: She is well-developed and well-nourished.  HENT:     Head: Normocephalic and atraumatic.  Eyes:     Extraocular Movements: EOM  normal.     Pupils: Pupils are equal, round, and reactive to light.  Pulmonary:     Effort: Pulmonary effort is normal.     Breath sounds: Normal breath sounds.  Abdominal:     General: Bowel sounds are normal.     Palpations: Abdomen is soft.  Musculoskeletal:        General: Normal range of motion.     Cervical back: Normal range of motion and neck supple.  Skin:    General: Skin is warm and dry.  Neurological:     Mental Status: She is alert and oriented to person, place, and time.   Psychiatric:        Mood and Affect: Mood and affect normal.        Behavior: Behavior normal.        Thought Content: Thought content normal.        Judgment: Judgment normal.     Ortho Exam  Specialty Comments:  No specialty comments available.  Imaging: No results found.   PMFS History: Patient Active Problem List   Diagnosis Date Noted  . Spinal stenosis, lumbar region, with neurogenic claudication 02/02/2014    Priority: High    Class: Chronic  . Spondylolisthesis of lumbar region 02/02/2014    Priority: High    Class: Chronic  . Lumbar degenerative disc disease 02/02/2014    Priority: Medium    Class: Chronic  . Arthritis of left hip 04/18/2020  . LSC (lichen simplex chronicus) 09/11/2019  . Vitamin D deficiency 12/28/2018  . Elevated hemoglobin ([place]) 10/29/2018  . Crohn's disease of ileum, unspecified complication ([place]) 35/36/1443  . ASCVD (arteriosclerotic cardiovascular disease) 10/28/2018  . Healthcare maintenance 08/21/2016  . Enterocolitis 07/14/2015  . Perforated viscus 07/14/2015  . Hyperthyroidism 12/23/2014  . Morbid obesity ([place]) 02/11/2013  . Tobacco abuse 09/17/2012  . Essential hypertension   . Hyperlipidemia    Past Medical History:  Diagnosis Date  . Crohn disease ([place])   . DJD (degenerative joint disease) of lumbar spine   . GERD (gastroesophageal reflux disease)    not everyday and uses tums when needed  . Heart murmur   . Hyperlipidemia   . Hypertension   . Lactose intolerance   . Perforated viscus 07/2015  . Rheumatoid arthritis ([place])   . STD (sexually transmitted disease)    Condyloma  . Thyroid disorder   . Vitamin D deficiency     Family History  Problem Relation Age of Onset  . Cancer Mother 1       cervical  . CVA Mother   . Hypertension Mother   . Diabetes Mother   . Obesity Mother   . High Cholesterol Mother   . Thyroid disease Daughter   . Diabetes Daughter   . Breast cancer Neg Hx     Past Surgical History:   Procedure Laterality Date  . ABDOMINAL HYSTERECTOMY     partial- still has cervix.  Marland Kitchen arm surgery Left   . CHOLECYSTECTOMY    . LUMBAR FUSION N/A 02/02/2014   Procedure: CENTRAL LAMINECTOMY L4-5,L5-S1, TRANSFORAMINAL LUMBAR INTERBODY FUSION L4-5,L5-S1, RODS AND SCREW FIX  AND L5-S1, LOCAL BONE GRAFT;  Surgeon: Jessy Oto, MD;  Location: [facility];  Service: Orthopedics;  Laterality: N/A;  . TONSILLECTOMY AND ADENOIDECTOMY     Social History   Occupational History  . Occupation: Retired Art therapist  Tobacco Use  . Smoking status: Current Every Day Smoker    Packs/day: 1.00    Years:  40.00    Pack years: 40.00    Types: Cigars  . Smokeless tobacco: Never Used  . Tobacco comment: 15 ciggs daily  Substance and Sexual Activity  . Alcohol use: Yes    Comment: occassional -  holidays  . Drug use: No  . Sexual activity: Not Currently    Comment: 1st intercourse 60 yo-Fewer than 5 partners

## 2020-04-27 ENCOUNTER — Telehealth: Payer: Self-pay | Admitting: Physical Medicine and Rehabilitation

## 2020-04-27 NOTE — Telephone Encounter (Signed)
Called patient to advise that she will not need a driver for her hip injection.

## 2020-04-27 NOTE — Telephone Encounter (Signed)
Pt called wanting to will she need a driver after her injection on 04/28/20

## 2020-04-28 ENCOUNTER — Encounter: Payer: Self-pay | Admitting: Physical Medicine and Rehabilitation

## 2020-04-28 ENCOUNTER — Ambulatory Visit (INDEPENDENT_AMBULATORY_CARE_PROVIDER_SITE_OTHER): Payer: Medicare HMO | Admitting: Physical Medicine and Rehabilitation

## 2020-04-28 ENCOUNTER — Other Ambulatory Visit: Payer: Self-pay

## 2020-04-28 ENCOUNTER — Ambulatory Visit: Payer: Self-pay

## 2020-04-28 DIAGNOSIS — M25552 Pain in left hip: Secondary | ICD-10-CM

## 2020-04-28 MED ORDER — TRIAMCINOLONE ACETONIDE 40 MG/ML IJ SUSP
60.0000 mg | INTRAMUSCULAR | Status: AC | PRN
Start: 2020-04-28 — End: 2020-04-28
  Administered 2020-04-28: 60 mg via INTRA_ARTICULAR

## 2020-04-28 MED ORDER — BUPIVACAINE HCL 0.25 % IJ SOLN
4.0000 mL | INTRAMUSCULAR | Status: AC | PRN
Start: 2020-04-28 — End: 2020-04-28
  Administered 2020-04-28: 4 mL via INTRA_ARTICULAR

## 2020-04-28 NOTE — Progress Notes (Signed)
Pt state left hip pain. Pt state walking and movement cane makes the pain worse. Pt state she takes pain meds to help ease the pain.  Numeric Pain Rating Scale and Functional Assessment Average Pain 0   In the last MONTH (on 0-10 scale) has pain interfered with the following?  1. General activity like being  able to carry out your everyday physical activities such as walking, climbing stairs, carrying groceries, or moving a chair?  Rating(10)

## 2020-04-28 NOTE — Progress Notes (Signed)
   [facility] - 60 y.o. female MRN [id number]  Date of birth: 07-16-1960  Office Visit Note: Visit Date: 04/28/2020 PCP: Libby Maw, MD Referred by: Libby Maw,*  Subjective: Chief Complaint  Patient presents with  . Left Hip - Pain   HPI:  SHARIN ALTIDOR is a 60 y.o. female who comes in today at the request of Dr. Basil Dess for planned Left anesthetic hip arthrogram with fluoroscopic guidance.  The patient has failed conservative care including home exercise, medications, time and activity modification.  This injection will be diagnostic and hopefully therapeutic.  Please see requesting physician notes for further details and justification.   ROS Otherwise per HPI.  Assessment & Plan: Visit Diagnoses:    ICD-10-CM   1. Pain in left hip  M25.552 XR C-ARM NO REPORT    Plan: No additional findings.   Meds & Orders: No orders of the defined types were placed in this encounter.   Orders Placed This Encounter  Procedures  . Large Joint Inj  . XR C-ARM NO REPORT    Follow-up: Return for Basil Dess, MD as scheduled.   Procedures: Large Joint Inj: L hip joint on 04/28/2020 3:23 PM Indications: pain and diagnostic evaluation Details: 22 G Needle length (in): 5 in. needle, anterior approach  Arthrogram: Yes  Medications: 4 mL bupivacaine 0.25 %; 60 mg triamcinolone acetonide 40 MG/ML Outcome: tolerated well, no immediate complications  Arthrogram demonstrated excellent flow of contrast throughout the joint surface without extravasation or obvious defect.  The patient had relief of symptoms during the anesthetic phase of the injection.  Procedure, treatment alternatives, risks and benefits explained, specific risks discussed. Consent was given by the patient. Immediately prior to procedure a time out was called to verify the correct patient, procedure, equipment, support staff and site/side marked as required. Patient was prepped and draped in the usual  sterile fashion.          Clinical History: No specialty comments available.     Objective:  VS:  HT:    WT:   BMI:     BP:   HR: bpm  TEMP: ( )  RESP:  Physical Exam   Imaging: No results found.

## 2020-05-02 ENCOUNTER — Ambulatory Visit: Payer: Medicare HMO | Admitting: Physical Medicine and Rehabilitation

## 2020-05-16 ENCOUNTER — Ambulatory Visit
Admission: RE | Admit: 2020-05-16 | Discharge: 2020-05-16 | Disposition: A | Discharge status: Home / Self Care | Payer: Medicare HMO | Source: Ambulatory Visit | Attending: Specialist | Admitting: Specialist

## 2020-05-16 ENCOUNTER — Other Ambulatory Visit: Payer: Self-pay

## 2020-05-16 DIAGNOSIS — Z4889 Encounter for other specified surgical aftercare: Secondary | ICD-10-CM

## 2020-05-16 DIAGNOSIS — M4316 Spondylolisthesis, lumbar region: Secondary | ICD-10-CM

## 2020-05-16 DIAGNOSIS — M5126 Other intervertebral disc displacement, lumbar region: Secondary | ICD-10-CM | POA: Diagnosis not present

## 2020-05-16 DIAGNOSIS — M4807 Spinal stenosis, lumbosacral region: Secondary | ICD-10-CM

## 2020-05-16 DIAGNOSIS — M5136 Other intervertebral disc degeneration, lumbar region: Secondary | ICD-10-CM | POA: Diagnosis not present

## 2020-05-16 DIAGNOSIS — M4326 Fusion of spine, lumbar region: Secondary | ICD-10-CM | POA: Diagnosis not present

## 2020-05-16 DIAGNOSIS — M48061 Spinal stenosis, lumbar region without neurogenic claudication: Secondary | ICD-10-CM | POA: Diagnosis not present

## 2020-05-16 MED ORDER — GADOBENATE DIMEGLUMINE 529 MG/ML IV SOLN
20.0000 mL | Freq: Once | INTRAVENOUS | Status: AC | PRN
  Administered 2020-05-16: 20 mL via INTRAVENOUS

## 2020-05-17 ENCOUNTER — Telehealth: Payer: Self-pay

## 2020-05-17 ENCOUNTER — Other Ambulatory Visit: Payer: Medicare HMO | Admitting: Specialist

## 2020-05-17 DIAGNOSIS — I713 Abdominal aortic aneurysm, ruptured, unspecified: Secondary | ICD-10-CM

## 2020-05-17 NOTE — Telephone Encounter (Signed)
Please advise 

## 2020-05-17 NOTE — Telephone Encounter (Signed)
FYI-  [facility] wanted to let Dr. Louanne Skye know about MRI results for Lumbar Spine and to pay close attention to number 1., under Impression.  Please advise.  Thank you.

## 2020-05-18 ENCOUNTER — Ambulatory Visit
Admission: RE | Admit: 2020-05-18 | Discharge: 2020-05-18 | Disposition: A | Discharge status: Home / Self Care | Payer: Medicare HMO | Source: Ambulatory Visit | Attending: Specialist | Admitting: Specialist

## 2020-05-18 DIAGNOSIS — I713 Abdominal aortic aneurysm, ruptured, unspecified: Secondary | ICD-10-CM

## 2020-05-18 DIAGNOSIS — I714 Abdominal aortic aneurysm, without rupture: Secondary | ICD-10-CM | POA: Diagnosis not present

## 2020-05-18 DIAGNOSIS — K429 Umbilical hernia without obstruction or gangrene: Secondary | ICD-10-CM | POA: Diagnosis not present

## 2020-05-18 DIAGNOSIS — K5730 Diverticulosis of large intestine without perforation or abscess without bleeding: Secondary | ICD-10-CM | POA: Diagnosis not present

## 2020-05-18 DIAGNOSIS — I723 Aneurysm of iliac artery: Secondary | ICD-10-CM | POA: Diagnosis not present

## 2020-05-18 MED ORDER — IOPAMIDOL (ISOVUE-370) INJECTION 76%
75.0000 mL | Freq: Once | INTRAVENOUS | Status: AC | PRN
  Administered 2020-05-18: 75 mL via INTRAVENOUS

## 2020-05-19 ENCOUNTER — Ambulatory Visit: Payer: Medicare HMO | Admitting: Specialist

## 2020-05-19 ENCOUNTER — Other Ambulatory Visit: Payer: Self-pay

## 2020-05-19 ENCOUNTER — Encounter: Payer: Self-pay | Admitting: Specialist

## 2020-05-19 VITALS — BP 127/79 | HR 74 | Ht 67.0 in | Wt 265.0 lb

## 2020-05-19 DIAGNOSIS — Z981 Arthrodesis status: Secondary | ICD-10-CM | POA: Diagnosis not present

## 2020-05-19 DIAGNOSIS — M5136 Other intervertebral disc degeneration, lumbar region: Secondary | ICD-10-CM

## 2020-05-19 DIAGNOSIS — M4316 Spondylolisthesis, lumbar region: Secondary | ICD-10-CM

## 2020-05-19 DIAGNOSIS — I713 Abdominal aortic aneurysm, ruptured, unspecified: Secondary | ICD-10-CM

## 2020-05-19 DIAGNOSIS — M4807 Spinal stenosis, lumbosacral region: Secondary | ICD-10-CM | POA: Diagnosis not present

## 2020-05-19 NOTE — Telephone Encounter (Signed)
Report discussed with Dr. Sherren Mocha [facility] and he called his office and had this patient scheduled. A CT angio of the abdomen and pelvis was ordered.

## 2020-05-19 NOTE — Progress Notes (Addendum)
Office Visit Note   Patient: Kathy Herrera           Date of Birth: [birth year]           MRN: [id number] Visit Date: 05/19/2020              Requested by: Libby Maw, MD [address] [place],  [place] [address] PCP: Libby Maw, MD   Assessment & Plan: Visit Diagnoses:  1. Spinal stenosis of lumbosacral region   2. Abdominal aortic aneurysm, ruptured ([place])   3. Lumbar degenerative disc disease   4. Spondylolisthesis of lumbar region   5. History of lumbar spinal fusion   60 year old female post lumbar decompression and fusion L4-5 and L5-S1 she has been experiencing pain into the left lower back with radiation into the left inguinal area. Clinically she does have findings suggesting that lumbar spinal stenosis at the next segment superior to her fusion is a cause for claudication symptoms. In evaluating the lumbar spinal stenosis an inferior renal aneurysm was seen to be increased In size compared with last MRI in 2017. She is overall improved with use of steroid dose pak and we will  Await the vascular consultation concerning her aneurysm before considering anything more concerning her Lumbar spine. The abdomenal and pelvis CT angiogram has been done.   Plan:Avoid bending, stooping and avoid lifting weights greater than 10 lbs. Avoid prolong standing and walking. Avoid frequent bending and stooping  No lifting greater than 10 lbs. May use ice or moist heat for pain. Weight loss is of benefit. Handicap license is approved. If you wish to consider a cortisone injection call us and  we will have Dr. Romona Curls secretary/Assistant will call to arrange for epidural steroid injection  Exercise with a stationary bike, or walking in a swimming pool. Follow-Up Instructions: No follow-ups on file.   Orders:  No orders of the defined types were placed in this encounter.  No orders of the defined types were placed in this encounter.     Procedures: No  procedures performed   Clinical Data: No additional findings.   Subjective: Chief Complaint  Patient presents with  . Lower Back - Follow-up    MRI Review  . Left Hip - Follow-up    Had a left hip injection on 04/28/20 with Dr. Ernestina Patches, states that it did help her, till she jerked her leg during a dream, but states that it is not as bad as it was before the injection.    60 year old female with back and left flank and inguinal pain. Pain is worse with standing and walking and she has intermittant pain left inguinal area. No bowel or bladder difficulty. Had recent MRI done and the results show moderate spinal stenosis about a 2 level L4-S1 fusion due to disc bulge and joint hypertrophy. There is a grade 1 anterolisthesis. An aneursym was noted and compared with previous Study the size of the aneurysm is greater 4.8cm compared with MRI from 2017 Radiology contacted [place] and we ordered a CT angiogram. I spoke with Dr. Curt Jews and she is scheduled to be seen 3/22 by vascular surgery for evaluation and treatment.    Review of Systems  Constitutional: Negative.   HENT: Negative.   Eyes: Negative.   Respiratory: Negative.   Cardiovascular: Negative.   Gastrointestinal: Negative.   Endocrine: Negative.   Genitourinary: Negative.   Musculoskeletal: Negative.   Skin: Negative.   Allergic/Immunologic: Negative.  Neurological: Negative.   Hematological: Negative.   Psychiatric/Behavioral: Negative.      Objective: Vital Signs: BP 127/79 (BP Location: Left Arm, Patient Position: Sitting)   Pulse 74   Ht 5\' 7"  (1.702 m)   Wt 265 lb (120.2 kg)   BMI 41.50 kg/m   Physical Exam Constitutional:      Appearance: She is well-developed.  HENT:     Head: Normocephalic and atraumatic.  Eyes:     Pupils: Pupils are equal, round, and reactive to light.  Pulmonary:     Effort: Pulmonary effort is normal.     Breath sounds: Normal breath sounds.  Abdominal:     General: Bowel sounds are  normal.     Palpations: Abdomen is soft.  Musculoskeletal:     Cervical back: Normal range of motion and neck supple.     Lumbar back: Negative right straight leg raise test.  Skin:    General: Skin is warm and dry.  Neurological:     Mental Status: She is alert and oriented to person, place, and time.  Psychiatric:        Behavior: Behavior normal.        Thought Content: Thought content normal.        Judgment: Judgment normal.     Back Exam   Tenderness  The patient is experiencing tenderness in the lumbar.  Range of Motion  Extension: abnormal  Flexion: abnormal  Lateral bend right: abnormal  Lateral bend left: abnormal  Rotation right: abnormal  Rotation left: abnormal   Muscle Strength  Right Quadriceps:  5/5  Left Quadriceps:  5/5  Right Hamstrings:  5/5  Left Hamstrings:  5/5   Tests  Straight leg raise right: negative  Other  Toe walk: normal Heel walk: normal Sensation: normal Erythema: no back redness Scars: absent      Specialty Comments:  No specialty comments available.  Imaging: No results found.   PMFS History: Patient Active Problem List   Diagnosis Date Noted  . Spinal stenosis, lumbar region, with neurogenic claudication 02/02/2014    Priority: High    Class: Chronic  . Spondylolisthesis of lumbar region 02/02/2014    Priority: High    Class: Chronic  . Lumbar degenerative disc disease 02/02/2014    Priority: Medium    Class: Chronic  . Arthritis of left hip 04/18/2020  . LSC (lichen simplex chronicus) 09/11/2019  . Vitamin D deficiency 12/28/2018  . Elevated hemoglobin ([place]) 10/29/2018  . Crohn's disease of ileum, unspecified complication ([place]) 78/29/5621  . ASCVD (arteriosclerotic cardiovascular disease) 10/28/2018  . Healthcare maintenance 08/21/2016  . Enterocolitis 07/14/2015  . Perforated viscus 07/14/2015  . Hyperthyroidism 12/23/2014  . Morbid obesity ([place]) 02/11/2013  . Tobacco abuse 09/17/2012  . Essential  hypertension   . Hyperlipidemia    Past Medical History:  Diagnosis Date  . Crohn disease ([place])   . DJD (degenerative joint disease) of lumbar spine   . GERD (gastroesophageal reflux disease)    not everyday and uses tums when needed  . Heart murmur   . Hyperlipidemia   . Hypertension   . Lactose intolerance   . Perforated viscus 07/2015  . Rheumatoid arthritis ([place])   . STD (sexually transmitted disease)    Condyloma  . Thyroid disorder   . Vitamin D deficiency     Family History  Problem Relation Age of Onset  . Cancer Mother 54       cervical  . CVA Mother   .  Hypertension Mother   . Diabetes Mother   . Obesity Mother   . High Cholesterol Mother   . Thyroid disease Daughter   . Diabetes Daughter   . Breast cancer Neg Hx     Past Surgical History:  Procedure Laterality Date  . ABDOMINAL HYSTERECTOMY     partial- still has cervix.  Marland Kitchen arm surgery Left   . CHOLECYSTECTOMY    . LUMBAR FUSION N/A 02/02/2014   Procedure: CENTRAL LAMINECTOMY L4-5,L5-S1, TRANSFORAMINAL LUMBAR INTERBODY FUSION L4-5,L5-S1, RODS AND SCREW FIX  AND L5-S1, LOCAL BONE GRAFT;  Surgeon: Jessy Oto, MD;  Location: [facility];  Service: Orthopedics;  Laterality: N/A;  . TONSILLECTOMY AND ADENOIDECTOMY     Social History   Occupational History  . Occupation: Retired Art therapist  Tobacco Use  . Smoking status: Current Every Day Smoker    Packs/day: 1.00    Years: 40.00    Pack years: 40.00    Types: Cigars  . Smokeless tobacco: Never Used  . Tobacco comment: 15 ciggs daily  Substance and Sexual Activity  . Alcohol use: Yes    Comment: occassional -  holidays  . Drug use: No  . Sexual activity: Not Currently    Comment: 1st intercourse 60 yo-Fewer than 5 partners

## 2020-05-19 NOTE — Patient Instructions (Addendum)
Plan:Avoid bending, stooping and avoid lifting weights greater than 10 lbs. Avoid prolong standing and walking. Avoid frequent bending and stooping  No lifting greater than 10 lbs. May use ice or moist heat for pain. Weight loss is of benefit. Handicap license is approved. If you wish to consider a cortisone injection call us and  we will have Dr. Romona Curls secretary/Assistant will call to arrange for epidural steroid injection  Exercise with a stationary bike, or walking in a swimming pool.

## 2020-05-24 ENCOUNTER — Other Ambulatory Visit: Payer: Self-pay

## 2020-05-24 ENCOUNTER — Encounter: Payer: Self-pay | Admitting: Vascular Surgery

## 2020-05-24 ENCOUNTER — Ambulatory Visit: Payer: Medicare HMO | Admitting: Vascular Surgery

## 2020-05-24 VITALS — BP 140/88 | HR 115 | Temp 98.2°F | Resp 20 | Ht 67.0 in | Wt 267.0 lb

## 2020-05-24 DIAGNOSIS — I714 Abdominal aortic aneurysm, without rupture, unspecified: Secondary | ICD-10-CM
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
## 2020-05-25 ENCOUNTER — Other Ambulatory Visit (HOSPITAL_COMMUNITY)
Admission: RE | Admit: 2020-05-25 | Discharge: 2020-05-25 | Disposition: A | Discharge status: Home / Self Care | Payer: Medicare HMO | Source: Ambulatory Visit | Attending: Vascular Surgery | Admitting: Vascular Surgery

## 2020-05-25 DIAGNOSIS — Z01812 Encounter for preprocedural laboratory examination: Secondary | ICD-10-CM | POA: Diagnosis not present

## 2020-05-25 DIAGNOSIS — Z20822 Contact with and (suspected) exposure to covid-19: Secondary | ICD-10-CM | POA: Insufficient documentation

## 2020-05-25 LAB — SARS CORONAVIRUS 2 (TAT 6-24 HRS): SARS Coronavirus 2: NEGATIVE

## 2020-05-26 ENCOUNTER — Encounter (HOSPITAL_COMMUNITY): Payer: Self-pay

## 2020-05-26 ENCOUNTER — Encounter (HOSPITAL_COMMUNITY): Payer: Self-pay | Admitting: Vascular Surgery

## 2020-05-26 ENCOUNTER — Ambulatory Visit (HOSPITAL_COMMUNITY)
Admission: RE | Admit: 2020-05-26 | Discharge: 2020-05-26 | Disposition: A | Discharge status: Home / Self Care | Payer: Medicare HMO | Source: Ambulatory Visit | Attending: Vascular Surgery | Admitting: Vascular Surgery

## 2020-05-26 ENCOUNTER — Encounter (HOSPITAL_COMMUNITY)
Admission: RE | Admit: 2020-05-26 | Discharge: 2020-05-26 | Disposition: A | Discharge status: Home / Self Care | Payer: Medicare HMO | Source: Ambulatory Visit | Attending: Vascular Surgery | Admitting: Vascular Surgery

## 2020-05-26 ENCOUNTER — Encounter (HOSPITAL_COMMUNITY): Payer: Self-pay | Admitting: Certified Registered"

## 2020-05-26 ENCOUNTER — Other Ambulatory Visit: Payer: Self-pay

## 2020-05-26 DIAGNOSIS — E785 Hyperlipidemia, unspecified: Secondary | ICD-10-CM | POA: Insufficient documentation

## 2020-05-26 DIAGNOSIS — I10 Essential (primary) hypertension: Secondary | ICD-10-CM | POA: Diagnosis not present

## 2020-05-26 DIAGNOSIS — Z01818 Encounter for other preprocedural examination: Secondary | ICD-10-CM | POA: Insufficient documentation

## 2020-05-26 DIAGNOSIS — E0590 Thyrotoxicosis, unspecified without thyrotoxic crisis or storm: Secondary | ICD-10-CM | POA: Diagnosis not present

## 2020-05-26 DIAGNOSIS — Z79899 Other long term (current) drug therapy: Secondary | ICD-10-CM | POA: Diagnosis not present

## 2020-05-26 DIAGNOSIS — Z7982 Long term (current) use of aspirin: Secondary | ICD-10-CM | POA: Diagnosis not present

## 2020-05-26 DIAGNOSIS — I714 Abdominal aortic aneurysm, without rupture: Secondary | ICD-10-CM | POA: Insufficient documentation

## 2020-05-26 LAB — URINALYSIS, ROUTINE W REFLEX MICROSCOPIC
Bacteria, UA: NONE SEEN
Bilirubin Urine: NEGATIVE
Glucose, UA: NEGATIVE mg/dL
Hgb urine dipstick: NEGATIVE
Ketones, ur: NEGATIVE mg/dL
Leukocytes,Ua: NEGATIVE
Nitrite: NEGATIVE
Protein, ur: 100 mg/dL — AB
Specific Gravity, Urine: 1.014 (ref 1.005–1.030)
pH: 7 (ref 5.0–8.0)

## 2020-05-26 LAB — SURGICAL PCR SCREEN
MRSA, PCR: NEGATIVE
Staphylococcus aureus: NEGATIVE

## 2020-05-26 LAB — APTT: aPTT: 32 seconds (ref 24–36)

## 2020-05-26 LAB — COMPREHENSIVE METABOLIC PANEL
ALT: 20 U/L (ref 0–44)
AST: 21 U/L (ref 15–41)
Albumin: 3.6 g/dL (ref 3.5–5.0)
Alkaline Phosphatase: 60 U/L (ref 38–126)
Anion gap: 9 (ref 5–15)
BUN: 13 mg/dL (ref 8–23)
CO2: 29 mmol/L (ref 22–32)
Calcium: 9.6 mg/dL (ref 8.9–10.3)
Chloride: 100 mmol/L (ref 98–111)
Creatinine, Ser: 1.24 mg/dL — ABNORMAL HIGH (ref 0.44–1.00)
GFR, Estimated: 50 mL/min — ABNORMAL LOW (ref >60)
Glucose, Bld: 99 mg/dL (ref 70–99)
Potassium: 3.6 mmol/L (ref 3.5–5.1)
Sodium: 138 mmol/L (ref 135–145)
Total Bilirubin: 0.5 mg/dL (ref 0.3–1.2)
Total Protein: 7.2 g/dL (ref 6.5–8.1)

## 2020-05-26 LAB — CBC
HCT: 51.7 % — ABNORMAL HIGH (ref 36.0–46.0)
Hemoglobin: 16.6 g/dL — ABNORMAL HIGH (ref 12.0–15.0)
MCH: 29.8 pg (ref 26.0–34.0)
MCHC: 32.1 g/dL (ref 30.0–36.0)
MCV: 92.8 fL (ref 80.0–100.0)
Platelets: 199 10*3/uL (ref 150–400)
RBC: 5.57 MIL/uL — ABNORMAL HIGH (ref 3.87–5.11)
RDW: 14.1 % (ref 11.5–15.5)
WBC: 10.1 10*3/uL (ref 4.0–10.5)
nRBC: 0 % (ref 0.0–0.2)

## 2020-05-26 LAB — PROTIME-INR
INR: 1.2 (ref 0.8–1.2)
Prothrombin Time: 14.3 seconds (ref 11.4–15.2)

## 2020-05-26 LAB — TYPE AND SCREEN
ABO/RH(D): AB POS
Antibody Screen: NEGATIVE

## 2020-05-26 MED ORDER — DEXTROSE 5 % IV SOLN
3.0000 g | INTRAVENOUS | Status: DC
  Filled 2020-05-26: qty 3000

## 2020-05-26 NOTE — Anesthesia Preprocedure Evaluation (Deleted)
Anesthesia Evaluation    Airway        Dental   Pulmonary Current Smoker,           Cardiovascular hypertension,      Neuro/Psych    GI/Hepatic   Endo/Other    Renal/GU      Musculoskeletal   Abdominal   Peds  Hematology   Anesthesia Other Findings   Reproductive/Obstetrics                             Anesthesia Physical Anesthesia Plan  ASA:   Anesthesia Plan:    Post-op Pain Management:    Induction:   PONV Risk Score and Plan:   Airway Management Planned:   Additional Equipment:   Intra-op Plan:   Post-operative Plan:   Informed Consent:   Plan Discussed with:   Anesthesia Plan Comments: (See PAT note written 05/26/2020 by Myra Gianotti, PA-C. )        Anesthesia Quick Evaluation

## 2020-05-26 NOTE — Progress Notes (Signed)
Surgical Instructions    Your procedure is scheduled on Friday 05/27/2020.  Report to [facility] Main Entrance "A" at 08:30 A.M., then check in with the Admitting office.  Call this number if you have problems the morning of surgery:  [phone]   If you have any questions prior to your surgery date call [phone]: Open Monday-Friday 8am-4pm    Remember:  Do not eat or drink after midnight the night before your surgery    Take these medicines the morning of surgery with A SIP OF WATER: Acetaminophen (Tylenol) - if needed Atenolol (Tenormin) Famotidine (Pepcid) - if needed Hydrocodone-acetaminophen (Norco) - if needed for severe pain Methimazole (Tapazole) Rosuvastatin (Crestor) Tramadol-acetaminophen (Ultracet) - if needed for moderate pain   As of today, STOP taking Aleve, Naproxen, Ibuprofen, Motrin, Advil, Goody's, BC's, all herbal medications, fish oil, and all vitamins.  Follow your surgeon's instructions on when to stop Aspirin.  If no instructions were given by your surgeon then you will need to call the office to get those instructions.                        Do not wear jewelry, make up, or nail polish            Do not wear lotions, powders, perfumes/colognes, or deodorant.            Do not shave 48 hours prior to surgery.  Men may shave face and neck.            Do not bring valuables to the hospital.            [facility] is not responsible for any belongings or valuables.  Do NOT Smoke (Tobacco/Vaping) or drink Alcohol 24 hours prior to your procedure  If you use a CPAP at night, you may bring all equipment for your overnight stay.   Contacts, glasses, hearing aids, dentures or partials may not be worn into surgery, please bring cases for these belongings   For patients admitted to the hospital, discharge time will be determined by your treatment team.   Patients discharged the day of surgery will not be allowed to drive home, and someone needs to stay  with them for 24 hours.    Special instructions:   [facility]- Preparing For Surgery  Before surgery, you can play an important role. Because skin is not sterile, your skin needs to be as free of germs as possible. You can reduce the number of germs on your skin by washing with CHG (chlorahexidine gluconate) Soap before surgery.  CHG is an antiseptic cleaner which kills germs and bonds with the skin to continue killing germs even after washing.    Oral Hygiene is also important to reduce your risk of infection.  Remember - BRUSH YOUR TEETH THE MORNING OF SURGERY WITH YOUR REGULAR TOOTHPASTE  Please do not use if you have an allergy to CHG or antibacterial soaps. If your skin becomes reddened/irritated stop using the CHG.  Do not shave (including legs and underarms) for at least 48 hours prior to first CHG shower. It is OK to shave your face.  Please follow these instructions carefully.   1. Shower the NIGHT BEFORE SURGERY and the MORNING OF SURGERY  2. If you chose to wash your hair, wash your hair first as usual with your normal shampoo.  3. After you shampoo, rinse your hair and body thoroughly to remove the shampoo.  4. Wash Face  and genitals (private parts) with your normal soap.  5. Shower the NIGHT BEFORE SURGERY and the MORNING OF SURGERY with CHG Soap.   6. Use CHG Soap as you would any other liquid soap. You can apply CHG directly to the skin and wash gently with a scrungie or a clean washcloth.   7. Apply the CHG Soap to your body ONLY FROM THE NECK DOWN.  Do not use on open wounds or open sores. Avoid contact with your eyes, ears, mouth and genitals (private parts). Wash Face and genitals (private parts)  with your normal soap.   8. Wash thoroughly, paying special attention to the area where your surgery will be performed.  9. Thoroughly rinse your body with warm water from the neck down.  10. DO NOT shower/wash with your normal soap after using and rinsing off the CHG  Soap.  11. Pat yourself dry with a CLEAN TOWEL.  12. Wear CLEAN PAJAMAS to bed the night before surgery  13. Place CLEAN SHEETS on your bed the night before your surgery  14. DO NOT SLEEP WITH PETS.   Day of Surgery: Shower with CHG soap as directed Wear Clean/Comfortable clothing the morning of surgery Do not apply any deodorants/lotions.   Remember to brush your teeth WITH YOUR REGULAR TOOTHPASTE.   Please read over the following fact sheets that you were given.

## 2020-05-26 NOTE — Progress Notes (Addendum)
Anesthesia PAT Evaluation:   Case: [id number] Date/Time: 05/27/20 1015   Procedure: ABDOMINAL AORTIC ENDOVASCULAR STENT GRAFT (N/A )   Anesthesia type: General   Pre-op diagnosis: AAA   Location: [facility] OR ROOM 16 / [place] OR   Surgeons: Cherre Robins, MD      DISCUSSION: Patient is a 60 year old female scheduled for the above procedure.  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   Hyperthyroidism followed by Dr. Loanne Drilling. TSH 6.81 (H) and Free T4 0.59 (L) on 01/22/20. Methimazole dose decreased. Says she has not been treated for RA since moving from [facility] to [place] in 2012.   Patient reported URI symptoms x 5 days. Initially started with sinus and nasal drainage, and moved into her chest 2 days ago. Now with intermittent congested cough. One earlier evening of nausea, but otherwise no vomiting, diarrhea, sore throat, body aches, fever. She has chronic DOE, but more pronounced since with symptoms. She hast been taking Benadryl and Mucinex. She feels like her symptoms are improving, although not yet at her baseline.  No chest pain or edema. No syncope. Gets occasional "charley horse" sensation in her upper back when she moves a certain way. No known CAD, CHF, afib history. Denied prior stress test or cardiac cath. She was sitting in a rolling wheelchair during evaluation, but no labored breathing noted. No conversational dyspnea. She did have an occasional congested cough. Faint bibasilar crackles with few scattered wheezes on the left, although improved with cough and deep breathing. She does not typically lie on her back, but feels she could. Reports her activity is limited due to severe back pain with prolonged walking. She may need back surgery in the  future, and in fact, lumbar MRI was how right CIA aneurysm was identified. O2 sat 91% with VS, but 94% on RA when I rechecked. She does not have an inhaler at home. Smokes 1 pack of cigars/day.  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 Recent URI symptoms as outlined above, felt to be improving and with negative COVID-19 test. Discussed above with anesthesiologist Nolon Nations, MD with recommendation for CXR to evaluate for pneumonia and to discuss further with Dr. Stanford Breed. CXR showed no active disease. WBC 10.1. COVID-19 test negative on 05/25/20. She feels she is improving. Discussed with Dr. Stanford Breed and with the patient. She will be re-evaluated on the day of surgery and definitive plan made at that time regarding proceeding as planned.   VS: BP 132/81   Pulse 74   Temp 37 C (Oral)   Resp 18   Ht 5\' 7"  (1.702 m)   Wt 117.1 kg   SpO2 91%   BMI 40.42 kg/m  Recheck O2 sat 94%. HR 69. Provider wore N95, universal face mask, face shield. Patient wore universal face mask. Heart RRR, no murmur noted. Lungs with bibasilar crackles, scattered wheezes on the left posteriorly, both with improvement after cough and deep breathing. No ankle edema.    PROVIDERS: Libby Maw, MD is PCP  Renato Shin, MD is endocrinologist. Last visit 01/22/20.  - She is not followed routinely by cardiology, but was evaluated by Mertie Moores, MD in 2014 for preoperative evaluation due to abnormal EKG (T wave inversion in lateral leads,  felt consistent with repolarization abnormality). Echo showed mild LVH with normal EF, grade 1 diastolic dysfunction. She was cleared for back surgery at that time.    LABS: Labs reviewed: Acceptable for surgery. (all labs ordered are listed, but only abnormal results are displayed)  Labs Reviewed  CBC - Abnormal; Notable for the following components:      Result Value   RBC 5.57 (*)    Hemoglobin 16.6 (*)    HCT 51.7  (*)    All other components within normal limits  COMPREHENSIVE METABOLIC PANEL - Abnormal; Notable for the following components:   Creatinine, Ser 1.24 (*)    GFR, Estimated 50 (*)    All other components within normal limits  URINALYSIS, ROUTINE W REFLEX MICROSCOPIC - Abnormal; Notable for the following components:   APPearance HAZY (*)    Protein, ur 100 (*)    All other components within normal limits  SURGICAL PCR SCREEN  PROTIME-INR  APTT  TYPE AND SCREEN    IMAGES: CXR 05/26/20: FINDINGS: Frontal and lateral views of the chest demonstrate a stable cardiac silhouette. Chronic elevation of the left hemidiaphragm. No airspace disease, effusion, or pneumothorax. No acute bony abnormalities. IMPRESSION: 1. No acute intrathoracic process.  CTA Abd/pelvis 05/18/20: IMPRESSION: 1. 4.8 cm infrarenal abdominal aortic aneurysm. Recommend follow-up CT/MR every 6 months and vascular consultation. This recommendation follows ACR consensus guidelines: White Paper of the ACR Incidental Findings Committee II on Vascular Findings. J Am Coll Radiol 2013; 10:789-794. 2. Bilateral common iliac artery aneurysms of 3.2 cm on the right and 1.6 cm on the left, and smaller internal iliac aneurysms as above. 3. Colonic diverticulosis. 4. Small paraumbilical hernia containing only mesenteric fat. - Aortic Atherosclerosis (ICD10-I70.0).  CT Chest lung cancer screen 10/21/19: IMPRESSION: 1. Lung-RADS 2, benign appearance or behavior. Continue annual screening with low-dose chest CT without contrast in 12 months. 2. Age advanced coronary artery atherosclerosis. Recommend assessment of coronary risk factors and consideration of medical therapy. 3. Aortic atherosclerosis (ICD10-I70.0) and emphysema (ICD10-J43.9). 4. Left renal lesion which was similar in size to 2017, favoring a complex cyst.  MRI L-spine 05/16/20: IMPRESSION: 1. Partially imaged infrarenal aortic aneurysm, which  is significantly increased in size in comparison to CT from September 16, 2015 (measuring up to 5 cm on this study). There is also increased aneurysmal dilation of the partially imaged right common iliac artery, measuring up to 3.1 cm on this study. Recommend CTA abdomen and pelvis to fully characterize. 2. Progression of degenerative disease at L3-L4 with moderate canal stenosis and moderate right greater than left foraminal stenosis. 3. L4-S1 PLIF without significant canal stenosis at these levels. Limited evaluation of the foramina due to metallic artifact. 4. Limited evaluation of the lower thoracic levels with mild canal stenosis at T10-T11 and T11-T12 and possibly moderate foraminal stenosis at T11-T12. An MRI of the thoracic spine could better characterize if clinically indicated.   EKG: 09/23/19: Sinus  Rhythm  -Left axis -anterior fascicular block.    -  Diffuse nonspecific T-abnormality.  Otherwise normal    CV: Echo 01/07/13: Study Conclusions  - Left ventricle: The cavity size was normal. Wall thickness  was increased in a pattern of mild LVH. Systolic function  was vigorous. The estimated ejection fraction was in the  range of 65% to 70%. Wall motion was normal; there were no  regional wall motion abnormalities. Doppler parameters are  consistent with abnormal left ventricular relaxation  (grade 1 diastolic dysfunction).  - Left atrium:  The atrium was mildly dilated.     Past Medical History:  Diagnosis Date  . Crohn disease ([place])   . DJD (degenerative joint disease) of lumbar spine   . GERD (gastroesophageal reflux disease)    not everyday and uses tums when needed  . Heart murmur    patient denies, states she was "diagnosed in [place], no follow up here"  . Hyperlipidemia   . Hypertension   . Lactose intolerance   . Perforated viscus 07/2015  . Rheumatoid arthritis ([place])   . STD (sexually transmitted disease)    Condyloma  . Thyroid disorder    . Vitamin D deficiency     Past Surgical History:  Procedure Laterality Date  . ABDOMINAL HYSTERECTOMY     partial- still has cervix.  Marland Kitchen arm surgery Left   . CHOLECYSTECTOMY    . LUMBAR FUSION N/A 02/02/2014   Procedure: CENTRAL LAMINECTOMY L4-5,L5-S1, TRANSFORAMINAL LUMBAR INTERBODY FUSION L4-5,L5-S1, RODS AND SCREW FIX  AND L5-S1, LOCAL BONE GRAFT;  Surgeon: Jessy Oto, MD;  Location: [place] [facility];  Service: Orthopedics;  Laterality: N/A;  . TONSILLECTOMY AND ADENOIDECTOMY      MEDICATIONS: . acetaminophen (TYLENOL) 500 MG tablet  . aspirin EC 81 MG tablet  . atenolol (TENORMIN) 100 MG tablet  . cholecalciferol (VITAMIN D) 25 MCG (1000 UNIT) tablet  . famotidine (PEPCID) 20 MG tablet  . hydrochlorothiazide (HYDRODIURIL) 25 MG tablet  . HYDROcodone-acetaminophen (NORCO) 10-325 MG tablet  . lisinopril (ZESTRIL) 40 MG tablet  . methimazole (TAPAZOLE) 5 MG tablet  . potassium chloride (KLOR-CON) 10 MEQ tablet  . rosuvastatin (CRESTOR) 10 MG tablet  . traMADol-acetaminophen (ULTRACET) 37.5-325 MG tablet  . traZODone (DESYREL) 50 MG tablet  . triamcinolone ointment (KENALOG) 0.1 %   No current facility-administered medications for this encounter.   Derrill Memo ON 05/27/2020] ceFAZolin (ANCEF) 3 g in dextrose 5 % 50 mL IVPB    Myra Gianotti, PA-C Surgical Short Stay/Anesthesiology [facility] Phone [phone] [facility] Phone [phone] 05/26/2020 5:19 PM

## 2020-05-26 NOTE — Progress Notes (Signed)
PCP - Dr. Ethelene Hal Cardiologist -   PPM/ICD -  Device Orders -  Rep Notified -   Chest x-ray - n/a EKG - 09/23/19 Stress Test - patient denies  ECHO - 2014 Cardiac Cath - patient denies  Sleep Study - n/a CPAP -   Fasting Blood Sugar -  n/a Checks Blood Sugar _____ times a day  Blood Thinner Instructions:  Aspirin Instructions: continue ASA per Dr. Stanford Breed  ERAS Protcol - n/a, npo p mn PRE-SURGERY Ensure or G2-   COVID TEST- negative on 05/25/20   Anesthesia review: yes, patient presents with productive cough, cold symptoms; abnormal EKG  Patient denies shortness of breath, fever, cough and chest pain at PAT appointment; patient stated she has had runny nose and watery eyes for the last 5 days, now in chest, productive cough.   All instructions explained to the patient, with a verbal understanding of the material. Patient agrees to go over the instructions while at home for a better understanding. Patient also instructed to self quarantine after being tested for COVID-19. The opportunity to ask questions was provided.

## 2020-05-27 ENCOUNTER — Ambulatory Visit (HOSPITAL_COMMUNITY)
Admission: RE | Admit: 2020-05-27 | Discharge: 2020-05-27 | Disposition: A | Discharge status: Home / Self Care | Payer: Medicare HMO | Attending: Vascular Surgery | Admitting: Vascular Surgery

## 2020-05-27 ENCOUNTER — Other Ambulatory Visit: Payer: Self-pay

## 2020-05-27 ENCOUNTER — Encounter (HOSPITAL_COMMUNITY): Payer: Self-pay | Admitting: Vascular Surgery

## 2020-05-27 ENCOUNTER — Encounter (HOSPITAL_COMMUNITY)
Admission: RE | Disposition: A | Discharge status: Home / Self Care | Payer: Self-pay | Source: Home / Self Care | Attending: Vascular Surgery

## 2020-05-27 DIAGNOSIS — R059 Cough, unspecified: Secondary | ICD-10-CM | POA: Diagnosis not present

## 2020-05-27 DIAGNOSIS — R0902 Hypoxemia: Secondary | ICD-10-CM | POA: Diagnosis not present

## 2020-05-27 DIAGNOSIS — R5381 Other malaise: Secondary | ICD-10-CM | POA: Insufficient documentation

## 2020-05-27 DIAGNOSIS — Z538 Procedure and treatment not carried out for other reasons: Secondary | ICD-10-CM | POA: Diagnosis not present

## 2020-05-27 SURGERY — INSERTION, ENDOVASCULAR STENT GRAFT, AORTA, ABDOMINAL
Anesthesia: General

## 2020-05-27 MED ORDER — PROPOFOL 10 MG/ML IV BOLUS
INTRAVENOUS | Status: AC
  Filled 2020-05-27: qty 20

## 2020-05-27 MED ORDER — CHLORHEXIDINE GLUCONATE 0.12 % MT SOLN
15.0000 mL | Freq: Once | OROMUCOSAL | Status: AC
  Administered 2020-05-27: 15 mL via OROMUCOSAL
  Filled 2020-05-27: qty 15

## 2020-05-27 MED ORDER — CHLORHEXIDINE GLUCONATE CLOTH 2 % EX PADS: 6.0000 | MEDICATED_PAD | Freq: Once | CUTANEOUS | Status: DC

## 2020-05-27 MED ORDER — FENTANYL CITRATE (PF) 250 MCG/5ML IJ SOLN
INTRAMUSCULAR | Status: AC
  Filled 2020-05-27: qty 5

## 2020-05-27 MED ORDER — LACTATED RINGERS IV SOLN: INTRAVENOUS | Status: DC

## 2020-05-27 MED ORDER — ORAL CARE MOUTH RINSE: 15.0000 mL | Freq: Once | OROMUCOSAL | Status: AC

## 2020-05-27 MED ORDER — CARVEDILOL 3.125 MG PO TABS
3.1250 mg | ORAL_TABLET | Freq: Once | ORAL | Status: DC
  Filled 2020-05-27: qty 1

## 2020-05-27 MED ORDER — SODIUM CHLORIDE 0.9 % IV SOLN: INTRAVENOUS | Status: DC

## 2020-05-27 SURGICAL SUPPLY — 46 items
BLADE CLIPPER SURG (BLADE) ×2 IMPLANT
CANISTER SUCT 3000ML PPV (MISCELLANEOUS) ×2 IMPLANT
CATH BEACON 5.038 65CM KMP-01 (CATHETERS) ×2 IMPLANT
CATH OMNI FLUSH .035X70CM (CATHETERS) IMPLANT
CHLORAPREP W/TINT 26 (MISCELLANEOUS) ×2 IMPLANT
COVER WAND RF STERILE (DRAPES) ×2 IMPLANT
DERMABOND ADVANCED (GAUZE/BANDAGES/DRESSINGS) ×1
DERMABOND ADVANCED .7 DNX12 (GAUZE/BANDAGES/DRESSINGS) ×1 IMPLANT
DEVICE CLOSURE PERCLS PRGLD 6F (VASCULAR PRODUCTS) IMPLANT
DEVICE TORQUE KENDALL .025-038 (MISCELLANEOUS) IMPLANT
DRSG TEGADERM 2-3/8X2-3/4 SM (GAUZE/BANDAGES/DRESSINGS) ×4 IMPLANT
ELECT REM PT RETURN 9FT ADLT (ELECTROSURGICAL) ×4
ELECTRODE REM PT RTRN 9FT ADLT (ELECTROSURGICAL) ×2 IMPLANT
GAUZE SPONGE 2X2 8PLY STRL LF (GAUZE/BANDAGES/DRESSINGS) ×2 IMPLANT
GLOVE SURG SS PI 8.0 STRL IVOR (GLOVE) ×2 IMPLANT
GOWN STRL REUS W/ TWL LRG LVL3 (GOWN DISPOSABLE) ×2 IMPLANT
GOWN STRL REUS W/ TWL XL LVL3 (GOWN DISPOSABLE) ×1 IMPLANT
GOWN STRL REUS W/TWL LRG LVL3 (GOWN DISPOSABLE) ×2
GOWN STRL REUS W/TWL XL LVL3 (GOWN DISPOSABLE) ×1
GRAFT BALLN CATH 65CM (STENTS) IMPLANT
GUIDEWIRE ANGLED .035X150CM (WIRE) IMPLANT
KIT BASIN OR (CUSTOM PROCEDURE TRAY) ×2 IMPLANT
KIT DRAIN CSF ACCUDRAIN (MISCELLANEOUS) IMPLANT
KIT TURNOVER KIT B (KITS) ×2 IMPLANT
NS IRRIG 1000ML POUR BTL (IV SOLUTION) ×2 IMPLANT
PACK ENDOVASCULAR (PACKS) IMPLANT
PAD ARMBOARD 7.5X6 YLW CONV (MISCELLANEOUS) ×4 IMPLANT
PERCLOSE PROGLIDE 6F (VASCULAR PRODUCTS)
SET MICROPUNCTURE 5F STIFF (MISCELLANEOUS) IMPLANT
SHEATH BRITE TIP 8FR 23CM (SHEATH) IMPLANT
SHEATH PINNACLE 8F 10CM (SHEATH) ×2 IMPLANT
SPONGE GAUZE 2X2 STER 10/PKG (GAUZE/BANDAGES/DRESSINGS) ×2
STENT GRAFT BALLN CATH 65CM (STENTS)
STOPCOCK MORSE 400PSI 3WAY (MISCELLANEOUS) ×2 IMPLANT
SUT MNCRL AB 4-0 PS2 18 (SUTURE) IMPLANT
SUT PROLENE 5 0 C 1 24 (SUTURE) IMPLANT
SUT VIC AB 2-0 CT1 27 (SUTURE)
SUT VIC AB 2-0 CT1 TAPERPNT 27 (SUTURE) IMPLANT
SUT VIC AB 3-0 SH 27 (SUTURE)
SUT VIC AB 3-0 SH 27X BRD (SUTURE) IMPLANT
SYR 20CC LL (SYRINGE) ×4 IMPLANT
TOWEL GREEN STERILE (TOWEL DISPOSABLE) ×2 IMPLANT
TRAY FOLEY MTR SLVR 16FR STAT (SET/KITS/TRAYS/PACK) ×2 IMPLANT
TUBING INJECTOR 48 (MISCELLANEOUS) ×2 IMPLANT
WIRE AMPLATZ SS-J .035X180CM (WIRE) IMPLANT
WIRE BENTSON .035X145CM (WIRE) ×4 IMPLANT

## 2020-05-27 NOTE — Progress Notes (Signed)
Kathy Herrera surgery being cancelled - see note by Dr. Stanford Breed. Anesthesiologist and Dr. Stanford Breed have spoken with Kathy Herrera, Kathy Herrera understands situation, IV's removed and family member notified by Kathy Herrera herself. Kathy Herrera will be picked up by family member. Kathy Herrera taken to vehicle via wheelchair, all personal belongings with patient.

## 2020-05-27 NOTE — H&P (Signed)
Patient presents to preop with worsening respiratory illness. This appears viral. She is negative for COVID infection. Both the anesthesia team and I agree risk of complication is outweighed by benefit of prophylactic repair. We will reschedule her for the near future. I encouraged her to see her primary care physician if she does not improve by Monday.  Kathy Herrera. Stanford Breed, MD [facility] Phone Number: [phone] 05/27/2020 10:44 AM

## 2020-05-30 NOTE — Discharge Summary (Signed)
Patient with cough, malaise, mild hypoxemia today in preop holding. Will reschedule EVAR. Follow up with primary care physician.

## 2020-05-31 ENCOUNTER — Encounter (HOSPITAL_COMMUNITY): Payer: Self-pay | Admitting: Vascular Surgery

## 2020-06-01 ENCOUNTER — Telehealth (INDEPENDENT_AMBULATORY_CARE_PROVIDER_SITE_OTHER): Payer: Medicare HMO | Admitting: Family Medicine

## 2020-06-01 ENCOUNTER — Encounter: Payer: Self-pay | Admitting: Family Medicine

## 2020-06-01 VITALS — Ht 67.0 in | Wt 256.0 lb

## 2020-06-01 DIAGNOSIS — I719 Aortic aneurysm of unspecified site, without rupture: Secondary | ICD-10-CM | POA: Insufficient documentation

## 2020-06-01 DIAGNOSIS — I723 Aneurysm of iliac artery: Secondary | ICD-10-CM | POA: Insufficient documentation

## 2020-06-01 DIAGNOSIS — I714 Abdominal aortic aneurysm, without rupture, unspecified: Secondary | ICD-10-CM

## 2020-06-01 DIAGNOSIS — D126 Benign neoplasm of colon, unspecified: Secondary | ICD-10-CM | POA: Insufficient documentation

## 2020-06-01 DIAGNOSIS — Z860100 Personal history of colon polyps, unspecified: Secondary | ICD-10-CM | POA: Insufficient documentation

## 2020-06-01 DIAGNOSIS — J40 Bronchitis, not specified as acute or chronic: Secondary | ICD-10-CM

## 2020-06-01 MED ORDER — ALBUTEROL SULFATE HFA 108 (90 BASE) MCG/ACT IN AERS: 2.0000 | INHALATION_SPRAY | Freq: Four times a day (QID) | RESPIRATORY_TRACT | 0 refills | Status: DC | PRN

## 2020-06-01 NOTE — Progress Notes (Signed)
[facility] [address] [place] [address] Dept: [phone] Dept Fax: [phone]  Virtual Video Visit  I connected with Kathy Herrera on 06/01/20 at  1:00 PM EDT by a video enabled telemedicine application and verified that I am speaking with the correct person using two identifiers.  Location patient: Home Location provider: Clinic Persons participating in the virtual visit: Patient, Provider  I discussed the limitations of evaluation and management by telemedicine and the availability of in person appointments. The patient expressed understanding and agreed to proceed.  Chief Complaint  Patient presents with  . Acute Visit    C/o head/chest congestion, cough, wheezing 7-8 days.  Has taken Nyquil and Benadryl with little relief.      SUBJECTIVE:  HPI: Kathy Herrera is a 60 y.o. female who presents with a 7-8 day history of illness that began with rhinorrhea and sneezing, but then settled into chest congestion, cough productive of phlegm, and wheezing. Kathy Herrera is a smoker. She admits that she has had occasional wheezing with illnesses int he past and more distantly did use an inhaler. She is currently using Nyquil and Mucinex and finds these have helped her with sleep. Kathy Herrera had gone to the hospital on 3/25 for surgery related to repair of a infrarenal abdominal aortic aneurysm and a right common iliac artery aneurysm. Because of her illness the surgery was deferred. She did have a negative COVID test on 3/23 and a normal chest x-ray on 3/24.  Patient Active Problem List   Diagnosis Date Noted  . Arthritis of left hip 04/18/2020  . LSC (lichen simplex chronicus) 09/11/2019  . Vitamin D deficiency 12/28/2018  . Elevated hemoglobin ([place]) 10/29/2018  . Crohn's disease of ileum, unspecified complication ([place]) 24/11/7351  . ASCVD (arteriosclerotic cardiovascular disease) 10/28/2018  . Healthcare maintenance  08/21/2016  . Enterocolitis 07/14/2015  . Perforated viscus 07/14/2015  . Hyperthyroidism 12/23/2014  . Spinal stenosis, lumbar region, with neurogenic claudication 02/02/2014    Class: Chronic  . Spondylolisthesis of lumbar region 02/02/2014    Class: Chronic  . Lumbar degenerative disc disease 02/02/2014    Class: Chronic  . Morbid obesity ([place]) 02/11/2013  . Tobacco abuse 09/17/2012  . Essential hypertension   . Hyperlipidemia    Past Surgical History:  Procedure Laterality Date  . ABDOMINAL HYSTERECTOMY     partial- still has cervix.  Marland Kitchen arm surgery Left   . CHOLECYSTECTOMY    . LUMBAR FUSION N/A 02/02/2014   Procedure: CENTRAL LAMINECTOMY L4-5,L5-S1, TRANSFORAMINAL LUMBAR INTERBODY FUSION L4-5,L5-S1, RODS AND SCREW FIX  AND L5-S1, LOCAL BONE GRAFT;  Surgeon: Jessy Oto, MD;  Location: [place];  Service: Orthopedics;  Laterality: N/A;  . TONSILLECTOMY AND ADENOIDECTOMY     Family History  Problem Relation Age of Onset  . Cancer Mother 55       cervical  . CVA Mother   . Hypertension Mother   . Diabetes Mother   . Obesity Mother   . High Cholesterol Mother   . Thyroid disease Daughter   . Diabetes Daughter   . Breast cancer Neg Hx     Social History   Tobacco Use  . Smoking status: Current Every Day Smoker    Packs/day: 1.00    Years: 40.00    Pack years: 40.00    Types: Cigars  . Smokeless tobacco: Never Used  . Tobacco comment: 15 ciggs daily  Vaping Use  . Vaping Use: Never used  Substance Use Topics  . Alcohol use: Yes    Comment: occassional -  holidays  . Drug use: No    Current Outpatient Medications:  .  acetaminophen (TYLENOL) 500 MG tablet, Take 1,000 mg by mouth every 6 (six) hours as needed., Disp: , Rfl:  .  albuterol (VENTOLIN HFA) 108 (90 Base) MCG/ACT inhaler, Inhale 2 puffs into the lungs every 6 (six) hours as needed for wheezing or shortness of breath., Disp: 8 g, Rfl: 0 .  aspirin EC 81 MG tablet, Take 81 mg by mouth daily., Disp: ,  Rfl:  .  atenolol (TENORMIN) 100 MG tablet, TAKE 1 TABLET EVERY DAY (Patient taking differently: Take 100 mg by mouth daily.), Disp: 90 tablet, Rfl: 0 .  cholecalciferol (VITAMIN D) 25 MCG (1000 UNIT) tablet, Take 1,000 Units by mouth daily., Disp: , Rfl:  .  famotidine (PEPCID) 20 MG tablet, Take 1 tablet (20 mg total) by mouth 2 (two) times daily as needed for heartburn., Disp: 30 tablet, Rfl: 0 .  hydrochlorothiazide (HYDRODIURIL) 25 MG tablet, TAKE 1 TABLET EVERY DAY (Patient taking differently: Take 25 mg by mouth daily.), Disp: 90 tablet, Rfl: 0 .  HYDROcodone-acetaminophen (NORCO) 10-325 MG tablet, Take 1 tablet by mouth every 6 (six) hours as needed for severe pain., Disp: , Rfl:  .  lisinopril (ZESTRIL) 40 MG tablet, TAKE 1 TABLET EVERY DAY (Patient taking differently: Take 40 mg by mouth daily.), Disp: 90 tablet, Rfl: 0 .  methimazole (TAPAZOLE) 5 MG tablet, Take 0.5 tablets (2.5 mg total) by mouth 3 (three) times a week., Disp: 20 tablet, Rfl: 1 .  potassium chloride (KLOR-CON) 10 MEQ tablet, TAKE 1 TABLET EVERY DAY (Patient taking differently: Take 10 mEq by mouth daily.), Disp: 90 tablet, Rfl: 0 .  rosuvastatin (CRESTOR) 10 MG tablet, Take 1 tablet (10 mg total) by mouth daily., Disp: 90 tablet, Rfl: 3 .  traMADol-acetaminophen (ULTRACET) 37.5-325 MG tablet, Take 1 tablet by mouth every 6 (six) hours as needed. (Patient taking differently: Take 1 tablet by mouth every 6 (six) hours as needed for moderate pain.), Disp: 30 tablet, Rfl: 0 .  triamcinolone ointment (KENALOG) 0.1 %, APPLY DAILY TO RASH ON NAPE ONLY AS NEEDED. (Patient taking differently: Apply 1 application topically daily as needed (Rash).), Disp: 30 g, Rfl: 1 .  traZODone (DESYREL) 50 MG tablet, Take 0.5-1 tablets (25-50 mg total) by mouth at bedtime as needed for sleep. (Patient not taking: Reported on 06/01/2020), Disp: 30 tablet, Rfl: 1  Allergies  Allergen Reactions  . Shrimp [Shellfish Allergy] Hives    May be  seasoning not always   ROS: See pertinent positives and negatives per HPI.  OBSERVATIONS/OBJECTIVE:  VITALS per patient if applicable: Today's Vitals   06/01/20 1305  Weight: 256 lb (116.1 kg)  Height: 5\' 7"  (1.702 m)   Body mass index is 40.1 kg/m.  GENERAL: Alert and oriented. Appears well and in no acute distress.  HEENT: Atraumatic. Conjunctiva clear. No obvious abnormalities on inspection of external nose and ears.  NECK: Normal movements of the head and neck.  LUNGS: On inspection, no signs of respiratory distress. Breathing rate appears normal. No obvious gross SOB, gasping or wheezing, and no conversational dyspnea. Occasional cough observed.  CV: No obvious cyanosis.  MS: Moves all visible extremities without noticeable abnormality.  PSYCH/NEURO: Pleasant and cooperative. No obvious depression or anxiety. Speech and thought processing grossly intact.  ASSESSMENT AND PLAN:  1. Bronchitis with acute wheezing I reviewed the CXR  report and the notes from the day her surgery was deferred. With the history of smoking and audible wheezing, I will prescribe an albuterol inhaler. I recommended she use this 2 puffs every 4 hours while awake for the first 24 hours, then every 4 hours as needed. If not improved by next week, she should come for an in-person exam.  - albuterol (VENTOLIN HFA) 108 (90 Base) MCG/ACT inhaler; Inhale 2 puffs into the lungs every 6 (six) hours as needed for wheezing or shortness of breath.  Dispense: 8 g; Refill: 0   I discussed the assessment and treatment plan with the patient. The patient was provided an opportunity to ask questions and all were answered. The patient agreed with the plan and demonstrated an understanding of the instructions.   The patient was advised to call back or seek an in-person evaluation if the symptoms worsen or if the condition fails to improve as anticipated.  Haydee Salter, MD

## 2020-06-16 ENCOUNTER — Ambulatory Visit: Payer: Medicare HMO | Admitting: Specialist

## 2020-06-17 ENCOUNTER — Other Ambulatory Visit: Payer: Self-pay | Admitting: Endocrinology

## 2020-06-20 ENCOUNTER — Other Ambulatory Visit: Payer: Self-pay

## 2020-06-21 ENCOUNTER — Encounter (HOSPITAL_COMMUNITY): Payer: Self-pay

## 2020-06-21 NOTE — Progress Notes (Signed)
Surgical Instructions    Your procedure is scheduled on Friday April 22nd.   Report to [facility] Main Entrance "A" at 0530 A.M., then check in with the Admitting office.  Call this number if you have problems the morning of surgery:  [phone]   If you have any questions prior to your surgery date call [phone]: Open Monday-Friday 8am-4pm    Remember:  Do not eat or drink after midnight the night before your surgery     Take these medicines the morning of surgery with A SIP OF WATER   aspirin EC   atenolol (TENORMIN)  methimazole (TAPAZOLE)  rosuvastatin (CRESTOR)    Take these medicines if needed:   acetaminophen (TYLENOL)  albuterol (VENTOLIN) (Please bring this with you on the day of surgery)   As of today, STOP taking any Aleve, Naproxen, Ibuprofen, Motrin, Advil, Goody's, BC's, all herbal medications, fish oil, and all vitamins.                     Do not wear jewelry, make up, or nail polish            Do not wear lotions, powders, perfumes, or deodorant.            Do not shave 48 hours prior to surgery.              Do not bring valuables to the hospital.            [facility] is not responsible for any belongings or valuables.  Do NOT Smoke (Tobacco/Vaping) or drink Alcohol 24 hours prior to your procedure If you use a CPAP at night, you may bring all equipment for your overnight stay.   Contacts, glasses, dentures or partials may not be worn into surgery, please bring cases for these belongings   For patients admitted to the hospital, discharge time will be determined by your treatment team.   Patients discharged the day of surgery will not be allowed to drive home, and someone needs to stay with them for 24 hours.    Special instructions:   [facility]- Preparing For Surgery  Before surgery, you can play an important role. Because skin is not sterile, your skin needs to be as free of germs as possible. You can reduce the number of germs on your skin  by washing with CHG (chlorahexidine gluconate) Soap before surgery.  CHG is an antiseptic cleaner which kills germs and bonds with the skin to continue killing germs even after washing.    Oral Hygiene is also important to reduce your risk of infection.  Remember - BRUSH YOUR TEETH THE MORNING OF SURGERY WITH YOUR REGULAR TOOTHPASTE  Please do not use if you have an allergy to CHG or antibacterial soaps. If your skin becomes reddened/irritated stop using the CHG.  Do not shave (including legs and underarms) for at least 48 hours prior to first CHG shower. It is OK to shave your face.  Please follow these instructions carefully.   1. Shower the NIGHT BEFORE SURGERY and the MORNING OF SURGERY  2. If you chose to wash your hair, wash your hair first as usual with your normal shampoo.  3. After you shampoo, rinse your hair and body thoroughly to remove the shampoo.  4. Wash Face and genitals (private parts) with your normal soap.   5.  Shower the NIGHT BEFORE SURGERY and the MORNING OF SURGERY with CHG Soap.   6. Use CHG Soap  as you would any other liquid soap. You can apply CHG directly to the skin and wash gently with a scrungie or a clean washcloth.   7. Apply the CHG Soap to your body ONLY FROM THE NECK DOWN.  Do not use on open wounds or open sores. Avoid contact with your eyes, ears, mouth and genitals (private parts). Wash Face and genitals (private parts)  with your normal soap.   8. Wash thoroughly, paying special attention to the area where your surgery will be performed.  9. Thoroughly rinse your body with warm water from the neck down.  10. DO NOT shower/wash with your normal soap after using and rinsing off the CHG Soap.  11. Pat yourself dry with a CLEAN TOWEL.  12. Wear CLEAN PAJAMAS to bed the night before surgery  13. Place CLEAN SHEETS on your bed the night before your surgery  14. DO NOT SLEEP WITH PETS.   Day of Surgery: Take a shower with CHG soap. Wear  Clean/Comfortable clothing the morning of surgery Do not apply any deodorants/lotions.   Remember to brush your teeth WITH YOUR REGULAR TOOTHPASTE.   Please read over the following fact sheets that you were given.

## 2020-06-21 NOTE — Progress Notes (Signed)
Error

## 2020-06-22 ENCOUNTER — Other Ambulatory Visit (HOSPITAL_COMMUNITY)
Admission: RE | Admit: 2020-06-22 | Discharge: 2020-06-22 | Disposition: A | Discharge status: Home / Self Care | Payer: Medicare HMO | Source: Ambulatory Visit | Attending: Vascular Surgery | Admitting: Vascular Surgery

## 2020-06-22 ENCOUNTER — Encounter (HOSPITAL_COMMUNITY): Payer: Self-pay

## 2020-06-22 ENCOUNTER — Encounter (HOSPITAL_COMMUNITY)
Admission: RE | Admit: 2020-06-22 | Discharge: 2020-06-22 | Disposition: A | Discharge status: Home / Self Care | Payer: Medicare HMO | Source: Ambulatory Visit | Attending: Vascular Surgery | Admitting: Vascular Surgery

## 2020-06-22 ENCOUNTER — Other Ambulatory Visit: Payer: Self-pay

## 2020-06-22 DIAGNOSIS — Z20822 Contact with and (suspected) exposure to covid-19: Secondary | ICD-10-CM | POA: Insufficient documentation

## 2020-06-22 DIAGNOSIS — K5000 Crohn's disease of small intestine without complications: Secondary | ICD-10-CM | POA: Diagnosis present

## 2020-06-22 DIAGNOSIS — R011 Cardiac murmur, unspecified: Secondary | ICD-10-CM | POA: Diagnosis present

## 2020-06-22 DIAGNOSIS — E559 Vitamin D deficiency, unspecified: Secondary | ICD-10-CM | POA: Diagnosis present

## 2020-06-22 DIAGNOSIS — K219 Gastro-esophageal reflux disease without esophagitis: Secondary | ICD-10-CM | POA: Diagnosis present

## 2020-06-22 DIAGNOSIS — Z79899 Other long term (current) drug therapy: Secondary | ICD-10-CM | POA: Insufficient documentation

## 2020-06-22 DIAGNOSIS — M069 Rheumatoid arthritis, unspecified: Secondary | ICD-10-CM | POA: Diagnosis present

## 2020-06-22 DIAGNOSIS — Z981 Arthrodesis status: Secondary | ICD-10-CM | POA: Diagnosis not present

## 2020-06-22 DIAGNOSIS — E739 Lactose intolerance, unspecified: Secondary | ICD-10-CM | POA: Diagnosis present

## 2020-06-22 DIAGNOSIS — Z83438 Family history of other disorder of lipoprotein metabolism and other lipidemia: Secondary | ICD-10-CM | POA: Diagnosis not present

## 2020-06-22 DIAGNOSIS — M1612 Unilateral primary osteoarthritis, left hip: Secondary | ICD-10-CM | POA: Diagnosis present

## 2020-06-22 DIAGNOSIS — Y712 Prosthetic and other implants, materials and accessory cardiovascular devices associated with adverse incidents: Secondary | ICD-10-CM | POA: Diagnosis present

## 2020-06-22 DIAGNOSIS — Y838 Other surgical procedures as the cause of abnormal reaction of the patient, or of later complication, without mention of misadventure at the time of the procedure: Secondary | ICD-10-CM | POA: Diagnosis not present

## 2020-06-22 DIAGNOSIS — Z8249 Family history of ischemic heart disease and other diseases of the circulatory system: Secondary | ICD-10-CM | POA: Diagnosis not present

## 2020-06-22 DIAGNOSIS — Z01812 Encounter for preprocedural laboratory examination: Secondary | ICD-10-CM | POA: Insufficient documentation

## 2020-06-22 DIAGNOSIS — Z7982 Long term (current) use of aspirin: Secondary | ICD-10-CM | POA: Insufficient documentation

## 2020-06-22 DIAGNOSIS — F17210 Nicotine dependence, cigarettes, uncomplicated: Secondary | ICD-10-CM | POA: Diagnosis present

## 2020-06-22 DIAGNOSIS — Z91013 Allergy to seafood: Secondary | ICD-10-CM | POA: Diagnosis not present

## 2020-06-22 DIAGNOSIS — E0590 Thyrotoxicosis, unspecified without thyrotoxic crisis or storm: Secondary | ICD-10-CM | POA: Diagnosis present

## 2020-06-22 DIAGNOSIS — I723 Aneurysm of iliac artery: Secondary | ICD-10-CM | POA: Diagnosis present

## 2020-06-22 DIAGNOSIS — Z6841 Body Mass Index (BMI) 40.0 and over, adult: Secondary | ICD-10-CM | POA: Diagnosis not present

## 2020-06-22 DIAGNOSIS — Z7901 Long term (current) use of anticoagulants: Secondary | ICD-10-CM | POA: Insufficient documentation

## 2020-06-22 DIAGNOSIS — I10 Essential (primary) hypertension: Secondary | ICD-10-CM | POA: Diagnosis present

## 2020-06-22 DIAGNOSIS — I714 Abdominal aortic aneurysm, without rupture: Secondary | ICD-10-CM | POA: Diagnosis present

## 2020-06-22 DIAGNOSIS — E785 Hyperlipidemia, unspecified: Secondary | ICD-10-CM | POA: Diagnosis present

## 2020-06-22 DIAGNOSIS — Z9049 Acquired absence of other specified parts of digestive tract: Secondary | ICD-10-CM | POA: Diagnosis not present

## 2020-06-22 DIAGNOSIS — I2510 Atherosclerotic heart disease of native coronary artery without angina pectoris: Secondary | ICD-10-CM | POA: Diagnosis present

## 2020-06-22 DIAGNOSIS — T82310A Breakdown (mechanical) of aortic (bifurcation) graft (replacement), initial encounter: Secondary | ICD-10-CM | POA: Diagnosis not present

## 2020-06-22 DIAGNOSIS — Z90711 Acquired absence of uterus with remaining cervical stump: Secondary | ICD-10-CM | POA: Diagnosis not present

## 2020-06-22 HISTORY — DX: Thyrotoxicosis, unspecified without thyrotoxic crisis or storm: E05.90

## 2020-06-22 HISTORY — DX: Depression, unspecified: F32.A

## 2020-06-22 HISTORY — DX: Dyspnea, unspecified: R06.00

## 2020-06-22 LAB — CBC
HCT: 49.8 % — ABNORMAL HIGH (ref 36.0–46.0)
Hemoglobin: 15.8 g/dL — ABNORMAL HIGH (ref 12.0–15.0)
MCH: 29.6 pg (ref 26.0–34.0)
MCHC: 31.7 g/dL (ref 30.0–36.0)
MCV: 93.3 fL (ref 80.0–100.0)
Platelets: 216 10*3/uL (ref 150–400)
RBC: 5.34 MIL/uL — ABNORMAL HIGH (ref 3.87–5.11)
RDW: 14.6 % (ref 11.5–15.5)
WBC: 11.7 10*3/uL — ABNORMAL HIGH (ref 4.0–10.5)
nRBC: 0.2 % (ref 0.0–0.2)

## 2020-06-22 LAB — APTT: aPTT: 32 seconds (ref 24–36)

## 2020-06-22 LAB — COMPREHENSIVE METABOLIC PANEL
ALT: 17 U/L (ref 0–44)
AST: 21 U/L (ref 15–41)
Albumin: 3.7 g/dL (ref 3.5–5.0)
Alkaline Phosphatase: 56 U/L (ref 38–126)
Anion gap: 11 (ref 5–15)
BUN: 17 mg/dL (ref 8–23)
CO2: 28 mmol/L (ref 22–32)
Calcium: 10.2 mg/dL (ref 8.9–10.3)
Chloride: 99 mmol/L (ref 98–111)
Creatinine, Ser: 1.11 mg/dL — ABNORMAL HIGH (ref 0.44–1.00)
GFR, Estimated: 57 mL/min — ABNORMAL LOW (ref >60)
Glucose, Bld: 95 mg/dL (ref 70–99)
Potassium: 3.7 mmol/L (ref 3.5–5.1)
Sodium: 138 mmol/L (ref 135–145)
Total Bilirubin: 0.5 mg/dL (ref 0.3–1.2)
Total Protein: 7.4 g/dL (ref 6.5–8.1)

## 2020-06-22 LAB — TYPE AND SCREEN
ABO/RH(D): AB POS
Antibody Screen: NEGATIVE

## 2020-06-22 LAB — PROTIME-INR
INR: 1.1 (ref 0.8–1.2)
Prothrombin Time: 14.4 seconds (ref 11.4–15.2)

## 2020-06-22 LAB — SARS CORONAVIRUS 2 (TAT 6-24 HRS): SARS Coronavirus 2: NEGATIVE

## 2020-06-22 LAB — URINALYSIS, ROUTINE W REFLEX MICROSCOPIC
Bilirubin Urine: NEGATIVE
Glucose, UA: NEGATIVE mg/dL
Hgb urine dipstick: NEGATIVE
Ketones, ur: NEGATIVE mg/dL
Leukocytes,Ua: NEGATIVE
Nitrite: NEGATIVE
Protein, ur: NEGATIVE mg/dL
Specific Gravity, Urine: 1.009 (ref 1.005–1.030)
pH: 7 (ref 5.0–8.0)

## 2020-06-22 LAB — BLOOD GAS, ARTERIAL
Acid-Base Excess: 5.6 mmol/L — ABNORMAL HIGH (ref 0.0–2.0)
Bicarbonate: 30.3 mmol/L — ABNORMAL HIGH (ref 20.0–28.0)
Drawn by: 58793
FIO2: 21
O2 Saturation: 95.9 %
Patient temperature: 37
pCO2 arterial: 50.6 mmHg — ABNORMAL HIGH (ref 32.0–48.0)
pH, Arterial: 7.395 (ref 7.350–7.450)
pO2, Arterial: 78.1 mmHg — ABNORMAL LOW (ref 83.0–108.0)

## 2020-06-22 LAB — SURGICAL PCR SCREEN
MRSA, PCR: NEGATIVE
Staphylococcus aureus: NEGATIVE

## 2020-06-22 NOTE — Progress Notes (Signed)
PCP - Dr Ethelene Hal Cardiologist - n/a  Chest x-ray - 05/26/20 (2V) EKG - 09/23/19 Stress Test - n/a ECHO - 01/07/13 Cardiac Cath - n/a  Anesthesia review: yes.   Coronavirus Screening Covid test 06/22/20- Pending Do you have any of the following symptoms:  Cough yes/no: No Fever (>100.34F)  yes/no: No Runny nose yes/no: No Sore throat yes/no: No Difficulty breathing/shortness of breath  yes/no: No  Have you traveled in the last 14 days and where? yes/no: No  Patient verbalized understanding of instructions that were given to them at the PAT appointment.

## 2020-06-23 MED ORDER — DEXTROSE 5 % IV SOLN
3.0000 g | INTRAVENOUS | Status: AC
  Administered 2020-06-24: 3 g via INTRAVENOUS
  Filled 2020-06-23: qty 3

## 2020-06-23 NOTE — Anesthesia Preprocedure Evaluation (Addendum)
Anesthesia Evaluation  Patient identified by MRN, date of birth, ID band Patient awake    Reviewed: Allergy & Precautions, NPO status , Patient's Chart, lab work & pertinent test results  Airway Mallampati: II  TM Distance: >3 FB Neck ROM: Full    Dental  (+) Partial Upper, Lower Dentures   Pulmonary shortness of breath and with exertion, Current Smoker,    Pulmonary exam normal        Cardiovascular hypertension, Pt. on medications and Pt. on home beta blockers + Peripheral Vascular Disease (4.8cm AAA)   Rhythm:Regular Rate:Normal     Neuro/Psych Depression negative neurological ROS     GI/Hepatic Neg liver ROS, GERD  ,  Endo/Other  Hyperthyroidism Morbid obesity  Renal/GU negative Renal ROS  negative genitourinary   Musculoskeletal  (+) Arthritis , Osteoarthritis,    Abdominal (+)  Abdomen: soft. Bowel sounds: normal.  Peds  Hematology negative hematology ROS (+)   Anesthesia Other Findings   Reproductive/Obstetrics                           Anesthesia Physical Anesthesia Plan  ASA: III  Anesthesia Plan: General   Post-op Pain Management:    Induction: Intravenous  PONV Risk Score and Plan: 2 and Ondansetron, Dexamethasone, Midazolam and Treatment may vary due to age or medical condition  Airway Management Planned: Mask and Oral ETT  Additional Equipment: Arterial line  Intra-op Plan:   Post-operative Plan: Extubation in OR  Informed Consent: I have reviewed the patients History and Physical, chart, labs and discussed the procedure including the risks, benefits and alternatives for the proposed anesthesia with the patient or authorized representative who has indicated his/her understanding and acceptance.     Dental advisory given  Plan Discussed with: CRNA  Anesthesia Plan Comments: (PAT note written 06/23/2020 by Myra Gianotti, PA-C. Lab Results      Component                 Value               Date                      WBC                      11.7 (H)            06/22/2020                HGB                      15.8 (H)            06/22/2020                HCT                      49.8 (H)            06/22/2020                MCV                      93.3                06/22/2020                PLT  216                 06/22/2020           Lab Results      Component                Value               Date                      NA                       138                 06/22/2020                K                        3.7                 06/22/2020                CO2                      28                  06/22/2020                GLUCOSE                  95                  06/22/2020                BUN                      17                  06/22/2020                CREATININE               1.11 (H)            06/22/2020                CALCIUM                  10.2                06/22/2020                GFRNONAA                 57 (L)              06/22/2020                GFRAA                    60 (L)              07/20/2015          )      Anesthesia Quick Evaluation

## 2020-06-23 NOTE — Progress Notes (Signed)
Anesthesia Chart Review:  Case: [id number] Date/Time: 06/24/20 0715   Procedure: ABDOMINAL AORTIC ENDOVASCULAR STENT GRAFT (N/A )   Anesthesia type: General   Pre-op diagnosis: AAA   Location: [facility] OR ROOM 16 / [place] [facility]   Surgeons: Cherre Robins, MD      DISCUSSION: Patient is a 60 year old female scheduled for the above procedure. Surgery was initially scheduled for 05/27/20; however, she presented with ongoing URI symptoms (negative COVID-19 test and CXR) and decision made to postpone surgery.   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 BMI is consistent with morbid obesity.  Hyperthyroidism followed by Dr. Loanne Drilling. TSH 6.81 (H) and Free T4 0.59 (L) on 01/22/20. Methimazole dose decreased. Says she has not been treated for RA since moving from [facility] to [place] in 2012.   06/22/20 presurgical COVID-19 test negative. Anesthesia team to evaluate on the day of surgery.   VS: BP 139/80   Pulse 64   Temp 37.1 C (Oral)   Resp 18   Ht 5\' 7"  (1.702 m)   Wt 122.7 kg   SpO2 96%   BMI 42.35 kg/m    PROVIDERS: Libby Maw, MD is PCP  Renato Shin, MD is endocrinologist. Last visit 01/22/20.  - She is not followed routinely by cardiology, but was evaluated by Mertie Moores, MD in 2014 for preoperative evaluation due to abnormal EKG (T wave inversion in lateral leads, felt consistent with repolarization abnormality). Echo showed mild LVH with normal EF, grade 1 diastolic dysfunction. She was cleared for back surgery at that time.    LABS: Preoperative labs noted. No comparison ABG, + smoker, morbid obesity. O2 sat 96%. (all labs ordered are listed, but only abnormal results are displayed)  Labs Reviewed  CBC - Abnormal; Notable  for the following components:      Result Value   WBC 11.7 (*)    RBC 5.34 (*)    Hemoglobin 15.8 (*)    HCT 49.8 (*)    All other components within normal limits  COMPREHENSIVE METABOLIC PANEL - Abnormal; Notable for the following components:   Creatinine, Ser 1.11 (*)    GFR, Estimated 57 (*)    All other components within normal limits  BLOOD GAS, ARTERIAL - Abnormal; Notable for the following components:   pCO2 arterial 50.6 (*)    pO2, Arterial 78.1 (*)    Bicarbonate 30.3 (*)    Acid-Base Excess 5.6 (*)    All other components within normal limits  SURGICAL PCR SCREEN  PROTIME-INR  APTT  URINALYSIS, ROUTINE W REFLEX MICROSCOPIC  TYPE AND SCREEN     IMAGES: CXR 05/26/20: FINDINGS: Frontal and lateral views of the chest demonstrate a stable cardiac silhouette. Chronic elevation of the left hemidiaphragm. No airspace disease, effusion, or pneumothorax. No acute bony abnormalities. IMPRESSION: 1. No acute intrathoracic process.  CTA Abd/pelvis 05/18/20: IMPRESSION: 1. 4.8 cm infrarenal abdominal aortic aneurysm. Recommend follow-up CT/MR every 6 months and vascular consultation. This recommendation follows ACR consensus guidelines: White Paper of the ACR Incidental Findings Committee II on Vascular Findings. J Am Coll Radiol 2013; 10:789-794. 2. Bilateral common iliac artery aneurysms of 3.2 cm on the right and 1.6 cm on the left, and smaller internal iliac aneurysms as above. 3. Colonic diverticulosis. 4. Small paraumbilical hernia containing only  mesenteric fat. - Aortic Atherosclerosis (ICD10-I70.0).  CT Chest lung cancer screen 10/21/19: IMPRESSION: 1. Lung-RADS 2, benign appearance or behavior. Continue annual screening with low-dose chest CT without contrast in 12 months. 2. Age advanced coronary artery atherosclerosis. Recommend assessment of coronary risk factors and consideration of medical therapy. 3. Aortic atherosclerosis (ICD10-I70.0) and emphysema  (ICD10-J43.9). 4. Left renal lesion which was similar in size to 2017, favoring a complex cyst.  MRI L-spine 05/16/20: IMPRESSION: 1. Partially imaged infrarenal aortic aneurysm, which is significantly increased in size in comparison to CT from September 16, 2015 (measuring up to 5 cm on this study). There is also increased aneurysmal dilation of the partially imaged right common iliac artery, measuring up to 3.1 cm on this study. Recommend CTA abdomen and pelvis to fully characterize. 2. Progression of degenerative disease at L3-L4 with moderate canal stenosis and moderate right greater than left foraminal stenosis. 3. L4-S1 PLIF without significant canal stenosis at these levels. Limited evaluation of the foramina due to metallic artifact. 4. Limited evaluation of the lower thoracic levels with mild canal stenosis at T10-T11 and T11-T12 and possibly moderate foraminal stenosis at T11-T12. An MRI of the thoracic spine could better characterize if clinically indicated.   EKG: 09/23/19: Sinus  Rhythm  -Left axis -anterior fascicular block.    -  Diffuse nonspecific T-abnormality.  Otherwise normal    CV: Echo 01/07/13: Study Conclusions  - Left ventricle: The cavity size was normal. Wall thickness  was increased in a pattern of mild LVH. Systolic function  was vigorous. The estimated ejection fraction was in the  range of 65% to 70%. Wall motion was normal; there were no  regional wall motion abnormalities. Doppler parameters are  consistent with abnormal left ventricular relaxation  (grade 1 diastolic dysfunction).  - Left atrium: The atrium was mildly dilated.    Past Medical History:  Diagnosis Date  . Crohn disease ([place])   . Depression    History. No current problems  . DJD (degenerative joint disease) of lumbar spine   . Dyspnea    With Activity  . GERD (gastroesophageal reflux disease)    no current problems  . Heart murmur    patient denies,  states she was "diagnosed in [place], no follow up here"  . Heart murmur    no current problems  . Hyperlipidemia   . Hypertension   . Hyperthyroidism   . Lactose intolerance   . Perforated viscus 07/2015  . Rheumatoid arthritis ([place])   . STD (sexually transmitted disease)    Condyloma  . Thyroid disorder   . Vitamin D deficiency     Past Surgical History:  Procedure Laterality Date  . ABDOMINAL HYSTERECTOMY     partial- still has cervix.  Marland Kitchen arm surgery Left   . CHOLECYSTECTOMY    . COLONOSCOPY    . LUMBAR FUSION N/A 02/02/2014   Procedure: CENTRAL LAMINECTOMY L4-5,L5-S1, TRANSFORAMINAL LUMBAR INTERBODY FUSION L4-5,L5-S1, RODS AND SCREW FIX  AND L5-S1, LOCAL BONE GRAFT;  Surgeon: Jessy Oto, MD;  Location: [place];  Service: Orthopedics;  Laterality: N/A;  . TONSILLECTOMY AND ADENOIDECTOMY      MEDICATIONS: . acetaminophen (TYLENOL) 500 MG tablet  . albuterol (VENTOLIN HFA) 108 (90 Base) MCG/ACT inhaler  . aspirin EC 81 MG tablet  . atenolol (TENORMIN) 100 MG tablet  . cholecalciferol (VITAMIN D) 25 MCG (1000 UNIT) tablet  . hydrochlorothiazide (HYDRODIURIL) 25 MG tablet  . lisinopril (ZESTRIL) 40 MG tablet  . methimazole (TAPAZOLE)  5 MG tablet  . Multiple Vitamins-Minerals (MULTIVITAMIN WITH MINERALS) tablet  . potassium chloride (KLOR-CON) 10 MEQ tablet  . rosuvastatin (CRESTOR) 10 MG tablet  . traMADol-acetaminophen (ULTRACET) 37.5-325 MG tablet  . traZODone (DESYREL) 50 MG tablet  . triamcinolone ointment (KENALOG) 0.1 %   No current facility-administered medications for this encounter.    Myra Gianotti, PA-C Surgical Short Stay/Anesthesiology [facility] Phone [phone] [facility] Phone [phone] 06/23/2020 9:26 AM

## 2020-06-24 ENCOUNTER — Inpatient Hospital Stay (HOSPITAL_COMMUNITY): Payer: Medicare HMO | Admitting: Anesthesiology

## 2020-06-24 ENCOUNTER — Inpatient Hospital Stay (HOSPITAL_COMMUNITY)
Admission: RE | Admit: 2020-06-24 | Discharge: 2020-06-25 | DRG: 269 | Disposition: A | Discharge status: Home / Self Care | Payer: Medicare HMO | Attending: Vascular Surgery | Admitting: Vascular Surgery

## 2020-06-24 ENCOUNTER — Other Ambulatory Visit: Payer: Self-pay

## 2020-06-24 ENCOUNTER — Encounter (HOSPITAL_COMMUNITY)
Admission: RE | Disposition: A | Discharge status: Home / Self Care | Payer: Self-pay | Source: Home / Self Care | Attending: Vascular Surgery

## 2020-06-24 ENCOUNTER — Inpatient Hospital Stay (HOSPITAL_COMMUNITY): Payer: Medicare HMO | Admitting: Physician Assistant

## 2020-06-24 ENCOUNTER — Inpatient Hospital Stay (HOSPITAL_COMMUNITY): Payer: Medicare HMO

## 2020-06-24 ENCOUNTER — Encounter (HOSPITAL_COMMUNITY): Payer: Self-pay | Admitting: Vascular Surgery

## 2020-06-24 ENCOUNTER — Other Ambulatory Visit: Payer: Self-pay | Admitting: *Deleted

## 2020-06-24 DIAGNOSIS — E0590 Thyrotoxicosis, unspecified without thyrotoxic crisis or storm: Secondary | ICD-10-CM | POA: Diagnosis present

## 2020-06-24 DIAGNOSIS — I10 Essential (primary) hypertension: Secondary | ICD-10-CM | POA: Diagnosis present

## 2020-06-24 DIAGNOSIS — I714 Abdominal aortic aneurysm, without rupture, unspecified: Secondary | ICD-10-CM | POA: Diagnosis present

## 2020-06-24 DIAGNOSIS — R011 Cardiac murmur, unspecified: Secondary | ICD-10-CM | POA: Diagnosis present

## 2020-06-24 DIAGNOSIS — Y838 Other surgical procedures as the cause of abnormal reaction of the patient, or of later complication, without mention of misadventure at the time of the procedure: Secondary | ICD-10-CM | POA: Diagnosis not present

## 2020-06-24 DIAGNOSIS — T82310A Breakdown (mechanical) of aortic (bifurcation) graft (replacement), initial encounter: Secondary | ICD-10-CM | POA: Diagnosis not present

## 2020-06-24 DIAGNOSIS — F17210 Nicotine dependence, cigarettes, uncomplicated: Secondary | ICD-10-CM | POA: Diagnosis present

## 2020-06-24 DIAGNOSIS — K219 Gastro-esophageal reflux disease without esophagitis: Secondary | ICD-10-CM | POA: Diagnosis present

## 2020-06-24 DIAGNOSIS — Z9049 Acquired absence of other specified parts of digestive tract: Secondary | ICD-10-CM

## 2020-06-24 DIAGNOSIS — Z83438 Family history of other disorder of lipoprotein metabolism and other lipidemia: Secondary | ICD-10-CM

## 2020-06-24 DIAGNOSIS — M069 Rheumatoid arthritis, unspecified: Secondary | ICD-10-CM | POA: Diagnosis present

## 2020-06-24 DIAGNOSIS — Z981 Arthrodesis status: Secondary | ICD-10-CM | POA: Diagnosis not present

## 2020-06-24 DIAGNOSIS — Z20822 Contact with and (suspected) exposure to covid-19: Secondary | ICD-10-CM | POA: Diagnosis present

## 2020-06-24 DIAGNOSIS — I2510 Atherosclerotic heart disease of native coronary artery without angina pectoris: Secondary | ICD-10-CM | POA: Diagnosis present

## 2020-06-24 DIAGNOSIS — I723 Aneurysm of iliac artery: Secondary | ICD-10-CM

## 2020-06-24 DIAGNOSIS — E559 Vitamin D deficiency, unspecified: Secondary | ICD-10-CM | POA: Diagnosis present

## 2020-06-24 DIAGNOSIS — Z8249 Family history of ischemic heart disease and other diseases of the circulatory system: Secondary | ICD-10-CM

## 2020-06-24 DIAGNOSIS — E785 Hyperlipidemia, unspecified: Secondary | ICD-10-CM | POA: Diagnosis present

## 2020-06-24 DIAGNOSIS — Z91013 Allergy to seafood: Secondary | ICD-10-CM | POA: Diagnosis not present

## 2020-06-24 DIAGNOSIS — Y712 Prosthetic and other implants, materials and accessory cardiovascular devices associated with adverse incidents: Secondary | ICD-10-CM | POA: Diagnosis present

## 2020-06-24 DIAGNOSIS — M1612 Unilateral primary osteoarthritis, left hip: Secondary | ICD-10-CM | POA: Diagnosis present

## 2020-06-24 DIAGNOSIS — Z90711 Acquired absence of uterus with remaining cervical stump: Secondary | ICD-10-CM

## 2020-06-24 DIAGNOSIS — K5000 Crohn's disease of small intestine without complications: Secondary | ICD-10-CM | POA: Diagnosis present

## 2020-06-24 DIAGNOSIS — E739 Lactose intolerance, unspecified: Secondary | ICD-10-CM | POA: Diagnosis present

## 2020-06-24 DIAGNOSIS — Z6841 Body Mass Index (BMI) 40.0 and over, adult: Secondary | ICD-10-CM

## 2020-06-24 HISTORY — PX: EMBOLIZATION: SHX5507

## 2020-06-24 HISTORY — PX: ULTRASOUND GUIDANCE FOR VASCULAR ACCESS: SHX6516

## 2020-06-24 HISTORY — PX: ABDOMINAL AORTIC ENDOVASCULAR STENT GRAFT: SHX5707

## 2020-06-24 LAB — CBC
HCT: 43.8 % (ref 36.0–46.0)
Hemoglobin: 13.8 g/dL (ref 12.0–15.0)
MCH: 29.8 pg (ref 26.0–34.0)
MCHC: 31.5 g/dL (ref 30.0–36.0)
MCV: 94.6 fL (ref 80.0–100.0)
Platelets: 176 10*3/uL (ref 150–400)
RBC: 4.63 MIL/uL (ref 3.87–5.11)
RDW: 14.6 % (ref 11.5–15.5)
WBC: 13.6 10*3/uL — ABNORMAL HIGH (ref 4.0–10.5)
nRBC: 0 % (ref 0.0–0.2)

## 2020-06-24 LAB — BASIC METABOLIC PANEL
Anion gap: 6 (ref 5–15)
BUN: 12 mg/dL (ref 8–23)
CO2: 31 mmol/L (ref 22–32)
Calcium: 8.8 mg/dL — ABNORMAL LOW (ref 8.9–10.3)
Chloride: 100 mmol/L (ref 98–111)
Creatinine, Ser: 1.07 mg/dL — ABNORMAL HIGH (ref 0.44–1.00)
GFR, Estimated: 59 mL/min — ABNORMAL LOW (ref >60)
Glucose, Bld: 114 mg/dL — ABNORMAL HIGH (ref 70–99)
Potassium: 3.1 mmol/L — ABNORMAL LOW (ref 3.5–5.1)
Sodium: 137 mmol/L (ref 135–145)

## 2020-06-24 LAB — PROTIME-INR
INR: 1.3 — ABNORMAL HIGH (ref 0.8–1.2)
Prothrombin Time: 16 seconds — ABNORMAL HIGH (ref 11.4–15.2)

## 2020-06-24 LAB — APTT: aPTT: 34 seconds (ref 24–36)

## 2020-06-24 LAB — MAGNESIUM: Magnesium: 1.6 mg/dL — ABNORMAL LOW (ref 1.7–2.4)

## 2020-06-24 SURGERY — INSERTION, ENDOVASCULAR STENT GRAFT, AORTA, ABDOMINAL
Anesthesia: General | Site: Groin | Laterality: Right

## 2020-06-24 MED ORDER — SODIUM CHLORIDE 0.9 % IV SOLN: INTRAVENOUS | Status: DC

## 2020-06-24 MED ORDER — PHENOL 1.4 % MT LIQD: 1.0000 | OROMUCOSAL | Status: DC | PRN

## 2020-06-24 MED ORDER — FENTANYL CITRATE (PF) 100 MCG/2ML IJ SOLN
INTRAMUSCULAR | Status: AC
  Administered 2020-06-24: 25 ug via INTRAVENOUS
  Filled 2020-06-24: qty 2

## 2020-06-24 MED ORDER — FENTANYL CITRATE (PF) 100 MCG/2ML IJ SOLN
INTRAMUSCULAR | Status: DC | PRN
  Administered 2020-06-24 (×2): 50 ug via INTRAVENOUS
  Administered 2020-06-24 (×3): 25 ug via INTRAVENOUS

## 2020-06-24 MED ORDER — SODIUM CHLORIDE 0.9 % IV SOLN
INTRAVENOUS | Status: DC | PRN
  Administered 2020-06-24: 500 mL

## 2020-06-24 MED ORDER — SUGAMMADEX SODIUM 200 MG/2ML IV SOLN
INTRAVENOUS | Status: DC | PRN
  Administered 2020-06-24: 200 mg via INTRAVENOUS

## 2020-06-24 MED ORDER — ONDANSETRON HCL 4 MG/2ML IJ SOLN
4.0000 mg | Freq: Four times a day (QID) | INTRAMUSCULAR | Status: DC | PRN
  Administered 2020-06-24: 4 mg via INTRAVENOUS
  Filled 2020-06-24: qty 2

## 2020-06-24 MED ORDER — HYDROMORPHONE HCL 1 MG/ML IJ SOLN
0.5000 mg | INTRAMUSCULAR | Status: DC | PRN
  Administered 2020-06-24: 0.5 mg via INTRAVENOUS

## 2020-06-24 MED ORDER — LISINOPRIL 40 MG PO TABS
40.0000 mg | ORAL_TABLET | Freq: Every day | ORAL | Status: DC
  Administered 2020-06-24 – 2020-06-25 (×2): 40 mg via ORAL
  Filled 2020-06-24 (×2): qty 1

## 2020-06-24 MED ORDER — MAGNESIUM SULFATE 2 GM/50ML IV SOLN: 2.0000 g | Freq: Every day | INTRAVENOUS | Status: DC | PRN

## 2020-06-24 MED ORDER — PHENYLEPHRINE HCL-NACL 10-0.9 MG/250ML-% IV SOLN
INTRAVENOUS | Status: DC | PRN
  Administered 2020-06-24: 20 ug/min via INTRAVENOUS

## 2020-06-24 MED ORDER — FENTANYL CITRATE (PF) 100 MCG/2ML IJ SOLN
25.0000 ug | INTRAMUSCULAR | Status: DC | PRN
  Administered 2020-06-24: 50 ug via INTRAVENOUS
  Administered 2020-06-24 (×2): 25 ug via INTRAVENOUS

## 2020-06-24 MED ORDER — LACTATED RINGERS IV SOLN: INTRAVENOUS | Status: DC | PRN

## 2020-06-24 MED ORDER — HEPARIN SODIUM (PORCINE) 1000 UNIT/ML IJ SOLN
INTRAMUSCULAR | Status: DC | PRN
  Administered 2020-06-24: 10000 [IU] via INTRAVENOUS

## 2020-06-24 MED ORDER — MIDAZOLAM HCL 5 MG/5ML IJ SOLN
INTRAMUSCULAR | Status: DC | PRN
  Administered 2020-06-24: 2 mg via INTRAVENOUS

## 2020-06-24 MED ORDER — ROSUVASTATIN CALCIUM 5 MG PO TABS
10.0000 mg | ORAL_TABLET | Freq: Every day | ORAL | Status: DC
  Administered 2020-06-25: 10 mg via ORAL
  Filled 2020-06-24: qty 2

## 2020-06-24 MED ORDER — ACETAMINOPHEN 325 MG PO TABS
325.0000 mg | ORAL_TABLET | ORAL | Status: DC | PRN
Start: 2020-06-24 — End: 2020-06-25

## 2020-06-24 MED ORDER — ACETAMINOPHEN 10 MG/ML IV SOLN
INTRAVENOUS | Status: AC
  Filled 2020-06-24: qty 100

## 2020-06-24 MED ORDER — CHLORHEXIDINE GLUCONATE 0.12 % MT SOLN
OROMUCOSAL | Status: AC
  Administered 2020-06-24: 15 mL
  Filled 2020-06-24: qty 15

## 2020-06-24 MED ORDER — BISACODYL 5 MG PO TBEC: 5.0000 mg | DELAYED_RELEASE_TABLET | Freq: Every day | ORAL | Status: DC | PRN

## 2020-06-24 MED ORDER — OXYCODONE HCL 5 MG PO TABS: 5.0000 mg | ORAL_TABLET | Freq: Once | ORAL | Status: DC | PRN

## 2020-06-24 MED ORDER — IODIXANOL 320 MG/ML IV SOLN
INTRAVENOUS | Status: DC | PRN
  Administered 2020-06-24: 256 mL via INTRA_ARTERIAL

## 2020-06-24 MED ORDER — ASPIRIN EC 81 MG PO TBEC
81.0000 mg | DELAYED_RELEASE_TABLET | Freq: Every day | ORAL | Status: DC
  Administered 2020-06-25: 81 mg via ORAL
  Filled 2020-06-24: qty 1

## 2020-06-24 MED ORDER — ACETAMINOPHEN 10 MG/ML IV SOLN
1000.0000 mg | Freq: Once | INTRAVENOUS | Status: DC | PRN
  Administered 2020-06-24: 1000 mg via INTRAVENOUS

## 2020-06-24 MED ORDER — HYDROCHLOROTHIAZIDE 25 MG PO TABS
25.0000 mg | ORAL_TABLET | Freq: Every day | ORAL | Status: DC
  Administered 2020-06-24 – 2020-06-25 (×2): 25 mg via ORAL
  Filled 2020-06-24 (×2): qty 1

## 2020-06-24 MED ORDER — HYDRALAZINE HCL 20 MG/ML IJ SOLN: 5.0000 mg | INTRAMUSCULAR | Status: DC | PRN

## 2020-06-24 MED ORDER — CEFAZOLIN SODIUM-DEXTROSE 2-4 GM/100ML-% IV SOLN
2.0000 g | Freq: Three times a day (TID) | INTRAVENOUS | Status: AC
  Administered 2020-06-24 – 2020-06-25 (×2): 2 g via INTRAVENOUS
  Filled 2020-06-24 (×3): qty 100

## 2020-06-24 MED ORDER — PROTAMINE SULFATE 10 MG/ML IV SOLN
INTRAVENOUS | Status: DC | PRN
  Administered 2020-06-24: 20 mg via INTRAVENOUS
  Administered 2020-06-24: 10 mg via INTRAVENOUS
  Administered 2020-06-24: 20 mg via INTRAVENOUS

## 2020-06-24 MED ORDER — GUAIFENESIN-DM 100-10 MG/5ML PO SYRP: 15.0000 mL | ORAL_SOLUTION | ORAL | Status: DC | PRN

## 2020-06-24 MED ORDER — MIDAZOLAM HCL 2 MG/2ML IJ SOLN
INTRAMUSCULAR | Status: AC
  Filled 2020-06-24: qty 2

## 2020-06-24 MED ORDER — DOCUSATE SODIUM 100 MG PO CAPS
100.0000 mg | ORAL_CAPSULE | Freq: Every day | ORAL | Status: DC
  Administered 2020-06-25: 100 mg via ORAL
  Filled 2020-06-24: qty 1

## 2020-06-24 MED ORDER — PROPOFOL 10 MG/ML IV BOLUS
INTRAVENOUS | Status: AC
  Filled 2020-06-24: qty 20

## 2020-06-24 MED ORDER — ROCURONIUM BROMIDE 10 MG/ML (PF) SYRINGE
PREFILLED_SYRINGE | INTRAVENOUS | Status: DC | PRN
  Administered 2020-06-24: 60 mg via INTRAVENOUS
  Administered 2020-06-24: 40 mg via INTRAVENOUS

## 2020-06-24 MED ORDER — ALUM & MAG HYDROXIDE-SIMETH 200-200-20 MG/5ML PO SUSP: 15.0000 mL | ORAL | Status: DC | PRN

## 2020-06-24 MED ORDER — FENTANYL CITRATE (PF) 250 MCG/5ML IJ SOLN
INTRAMUSCULAR | Status: AC
  Filled 2020-06-24: qty 5

## 2020-06-24 MED ORDER — ATENOLOL 25 MG PO TABS
100.0000 mg | ORAL_TABLET | Freq: Every day | ORAL | Status: DC
  Administered 2020-06-25: 100 mg via ORAL
  Filled 2020-06-24: qty 4

## 2020-06-24 MED ORDER — ACETAMINOPHEN 650 MG RE SUPP: 325.0000 mg | RECTAL | Status: DC | PRN

## 2020-06-24 MED ORDER — ALBUTEROL SULFATE (2.5 MG/3ML) 0.083% IN NEBU: 2.5000 mg | INHALATION_SOLUTION | Freq: Four times a day (QID) | RESPIRATORY_TRACT | Status: DC | PRN

## 2020-06-24 MED ORDER — LIDOCAINE 2% (20 MG/ML) 5 ML SYRINGE
INTRAMUSCULAR | Status: DC | PRN
  Administered 2020-06-24: 60 mg via INTRAVENOUS

## 2020-06-24 MED ORDER — LABETALOL HCL 5 MG/ML IV SOLN: 10.0000 mg | INTRAVENOUS | Status: DC | PRN

## 2020-06-24 MED ORDER — OXYCODONE HCL 5 MG/5ML PO SOLN
5.0000 mg | Freq: Once | ORAL | Status: DC | PRN
Start: 2020-06-24 — End: 2020-06-24

## 2020-06-24 MED ORDER — ONDANSETRON HCL 4 MG/2ML IJ SOLN
INTRAMUSCULAR | Status: AC
  Filled 2020-06-24: qty 2

## 2020-06-24 MED ORDER — SODIUM CHLORIDE 0.9 % IV SOLN: 500.0000 mL | Freq: Once | INTRAVENOUS | Status: DC | PRN

## 2020-06-24 MED ORDER — CHLORHEXIDINE GLUCONATE CLOTH 2 % EX PADS: 6.0000 | MEDICATED_PAD | Freq: Once | CUTANEOUS | Status: DC

## 2020-06-24 MED ORDER — PHENYLEPHRINE 40 MCG/ML (10ML) SYRINGE FOR IV PUSH (FOR BLOOD PRESSURE SUPPORT)
PREFILLED_SYRINGE | INTRAVENOUS | Status: DC | PRN
  Administered 2020-06-24: 40 ug via INTRAVENOUS

## 2020-06-24 MED ORDER — PANTOPRAZOLE SODIUM 40 MG PO TBEC
40.0000 mg | DELAYED_RELEASE_TABLET | Freq: Every day | ORAL | Status: DC
  Administered 2020-06-24 – 2020-06-25 (×2): 40 mg via ORAL
  Filled 2020-06-24 (×2): qty 1

## 2020-06-24 MED ORDER — METOPROLOL TARTRATE 5 MG/5ML IV SOLN: 2.0000 mg | INTRAVENOUS | Status: DC | PRN

## 2020-06-24 MED ORDER — POTASSIUM CHLORIDE CRYS ER 10 MEQ PO TBCR
10.0000 meq | EXTENDED_RELEASE_TABLET | Freq: Every day | ORAL | Status: DC
  Administered 2020-06-24 – 2020-06-25 (×2): 10 meq via ORAL
  Filled 2020-06-24 (×2): qty 1

## 2020-06-24 MED ORDER — FLEET ENEMA 7-19 GM/118ML RE ENEM: 1.0000 | ENEMA | Freq: Once | RECTAL | Status: DC | PRN

## 2020-06-24 MED ORDER — HYDROMORPHONE HCL 1 MG/ML IJ SOLN
INTRAMUSCULAR | Status: AC
  Filled 2020-06-24: qty 1

## 2020-06-24 MED ORDER — PROPOFOL 10 MG/ML IV BOLUS
INTRAVENOUS | Status: DC | PRN
  Administered 2020-06-24: 120 mg via INTRAVENOUS

## 2020-06-24 MED ORDER — ONDANSETRON HCL 4 MG/2ML IJ SOLN
INTRAMUSCULAR | Status: DC | PRN
  Administered 2020-06-24: 4 mg via INTRAVENOUS

## 2020-06-24 MED ORDER — OXYCODONE-ACETAMINOPHEN 5-325 MG PO TABS
1.0000 | ORAL_TABLET | ORAL | Status: DC | PRN
  Administered 2020-06-24: 2 via ORAL
  Filled 2020-06-24: qty 2

## 2020-06-24 MED ORDER — POTASSIUM CHLORIDE CRYS ER 20 MEQ PO TBCR: 20.0000 meq | EXTENDED_RELEASE_TABLET | Freq: Every day | ORAL | Status: DC | PRN

## 2020-06-24 MED ORDER — PROMETHAZINE HCL 25 MG/ML IJ SOLN: 6.2500 mg | INTRAMUSCULAR | Status: DC | PRN

## 2020-06-24 MED ORDER — SENNOSIDES-DOCUSATE SODIUM 8.6-50 MG PO TABS: 1.0000 | ORAL_TABLET | Freq: Every evening | ORAL | Status: DC | PRN

## 2020-06-24 MED ORDER — HEPARIN SODIUM (PORCINE) 5000 UNIT/ML IJ SOLN
5000.0000 [IU] | Freq: Three times a day (TID) | INTRAMUSCULAR | Status: DC
  Administered 2020-06-25: 5000 [IU] via SUBCUTANEOUS
  Filled 2020-06-24: qty 1

## 2020-06-24 MED ORDER — 0.9 % SODIUM CHLORIDE (POUR BTL) OPTIME
TOPICAL | Status: DC | PRN
  Administered 2020-06-24: 1000 mL

## 2020-06-24 MED ORDER — HEPARIN SODIUM (PORCINE) 1000 UNIT/ML IJ SOLN
INTRAMUSCULAR | Status: AC
  Filled 2020-06-24: qty 1

## 2020-06-24 SURGICAL SUPPLY — 74 items
BALLN MUSTANG 12X20X75 (BALLOONS) ×3
BALLOON MUSTANG 12X20X75 (BALLOONS) ×2 IMPLANT
BLADE CLIPPER SURG (BLADE) ×3 IMPLANT
CANISTER SUCT 3000ML PPV (MISCELLANEOUS) ×3 IMPLANT
CATH BEACON 5.038 65CM KMP-01 (CATHETERS) ×3 IMPLANT
CATH OMNI FLUSH .035X70CM (CATHETERS) ×3 IMPLANT
CATH STRAIGHT 5FR 65CM (CATHETERS) ×3 IMPLANT
CATH VANSCH 5FR 6CM (CATHETERS) ×3 IMPLANT
CHLORAPREP W/TINT 26 (MISCELLANEOUS) ×3 IMPLANT
COIL NESTER 14X12 (Embolic) ×18 IMPLANT
COVER WAND RF STERILE (DRAPES) ×3 IMPLANT
DERMABOND ADVANCED (GAUZE/BANDAGES/DRESSINGS) ×1
DERMABOND ADVANCED .7 DNX12 (GAUZE/BANDAGES/DRESSINGS) ×2 IMPLANT
DEVICE CLOSURE PERCLS PRGLD 6F (VASCULAR PRODUCTS) IMPLANT
DEVICE TORQUE H2O (MISCELLANEOUS) ×3 IMPLANT
DEVICE TORQUE KENDALL .025-038 (MISCELLANEOUS) IMPLANT
DRAPE FEMORAL ANGIO 80X135IN (DRAPES) ×3 IMPLANT
DRSG TEGADERM 2-3/8X2-3/4 SM (GAUZE/BANDAGES/DRESSINGS) ×6 IMPLANT
DRYSEAL FLEXSHEATH 12FR 33CM (SHEATH) ×1
DRYSEAL FLEXSHEATH 18FR 33CM (SHEATH) ×1
ELECT CAUTERY BLADE 6.4 (BLADE) ×3 IMPLANT
ELECT REM PT RETURN 9FT ADLT (ELECTROSURGICAL) ×6
ELECTRODE REM PT RTRN 9FT ADLT (ELECTROSURGICAL) ×4 IMPLANT
EXCLUDER TNK 28X14.5MMX12CM (Endovascular Graft) ×2 IMPLANT
EXCLUDER TRUNK 28X14.5MMX12CM (Endovascular Graft) ×3 IMPLANT
GAUZE SPONGE 2X2 8PLY STRL LF (GAUZE/BANDAGES/DRESSINGS) ×4 IMPLANT
GLOVE SURG SS PI 8.0 STRL IVOR (GLOVE) ×3 IMPLANT
GOWN STRL REUS W/ TWL LRG LVL3 (GOWN DISPOSABLE) ×4 IMPLANT
GOWN STRL REUS W/ TWL XL LVL3 (GOWN DISPOSABLE) ×2 IMPLANT
GOWN STRL REUS W/TWL LRG LVL3 (GOWN DISPOSABLE) ×2
GOWN STRL REUS W/TWL XL LVL3 (GOWN DISPOSABLE) ×1
GRAFT BALLN CATH 65CM (STENTS) ×2 IMPLANT
GRAFT EXCLUDER AORTIC 28X3.3CM (Endovascular Graft) ×3 IMPLANT
GUIDEWIRE ANGLED .035X150CM (WIRE) ×3 IMPLANT
KIT BASIN OR (CUSTOM PROCEDURE TRAY) ×3 IMPLANT
KIT DRAIN CSF ACCUDRAIN (MISCELLANEOUS) IMPLANT
KIT ENCORE 26 ADVANTAGE (KITS) ×3 IMPLANT
KIT TURNOVER KIT B (KITS) ×3 IMPLANT
LEG CONTRALATERAL 16X12X10 (Vascular Products) ×1 IMPLANT
LEG CONTRALATERAL 20X11.5 (Vascular Products) ×1 IMPLANT
NS IRRIG 1000ML POUR BTL (IV SOLUTION) ×3 IMPLANT
PACK ENDOVASCULAR (PACKS) ×3 IMPLANT
PAD ARMBOARD 7.5X6 YLW CONV (MISCELLANEOUS) ×6 IMPLANT
PENCIL BUTTON HOLSTER BLD 10FT (ELECTRODE) ×3 IMPLANT
PERCLOSE PROGLIDE 6F (VASCULAR PRODUCTS)
SET MICROPUNCTURE 5F STIFF (MISCELLANEOUS) ×3 IMPLANT
SHEATH BRITE TIP 7FR 35CM (SHEATH) ×3 IMPLANT
SHEATH BRITE TIP 8FR 23CM (SHEATH) ×3 IMPLANT
SHEATH DRYSEAL FLEX 12FR 33CM (SHEATH) ×2 IMPLANT
SHEATH DRYSEAL FLEX 18FR 33CM (SHEATH) ×2 IMPLANT
SHEATH PINNACLE 5F 10CM (SHEATH) ×3 IMPLANT
SHEATH PINNACLE 8F 10CM (SHEATH) ×3 IMPLANT
SPONGE GAUZE 2X2 STER 10/PKG (GAUZE/BANDAGES/DRESSINGS) ×2
STENT GRAFT BALLN CATH 65CM (STENTS) ×1
STENT GRAFT CONTRALAT 16X12X10 (Vascular Products) ×2 IMPLANT
STENT GRAFT CONTRALAT 20X11.5 (Vascular Products) ×2 IMPLANT
STENT VIABAHN VBX 11X39X135 (Permanent Stent) ×3 IMPLANT
STENT VIABAHN VBX 7X59X135 (Permanent Stent) ×3 IMPLANT
STOPCOCK MORSE 400PSI 3WAY (MISCELLANEOUS) ×3 IMPLANT
SUT MNCRL AB 4-0 PS2 18 (SUTURE) ×9 IMPLANT
SUT PROLENE 5 0 C 1 24 (SUTURE) IMPLANT
SUT VIC AB 2-0 CT1 27 (SUTURE)
SUT VIC AB 2-0 CT1 TAPERPNT 27 (SUTURE) IMPLANT
SUT VIC AB 3-0 SH 27 (SUTURE)
SUT VIC AB 3-0 SH 27X BRD (SUTURE) IMPLANT
SYR 20CC LL (SYRINGE) ×6 IMPLANT
TOWEL GREEN STERILE (TOWEL DISPOSABLE) ×3 IMPLANT
TRAY FOLEY MTR SLVR 16FR STAT (SET/KITS/TRAYS/PACK) ×3 IMPLANT
TUBING INJECTOR 48 (MISCELLANEOUS) ×3 IMPLANT
WIRE AMPLATZ SS-J .035X180CM (WIRE) ×6 IMPLANT
WIRE AMPLATZ SS-J .035X260CM (WIRE) ×3 IMPLANT
WIRE BENTSON .035X145CM (WIRE) ×6 IMPLANT
WIRE LUNDERQUIST .035X180CM (WIRE) ×6 IMPLANT
WIRE ROSEN 145CM (WIRE) ×3 IMPLANT

## 2020-06-24 NOTE — Transfer of Care (Signed)
Immediate Anesthesia Transfer of Care Note  Patient: [place]  Procedure(s) Performed: ABDOMINAL AORTIC ENDOVASCULAR STENT GRAFT (Bilateral Groin) ULTRASOUND GUIDANCE FOR VASCULAR ACCESS (Bilateral Groin) COIL EMBOLIZATION RIGHT INTERNAL ILIAC ARTERY (Right Groin)  Patient Location: PACU  Anesthesia Type:General  Level of Consciousness: drowsy and patient cooperative  Airway & Oxygen Therapy: Patient Spontanous Breathing and Patient connected to face mask oxygen  Post-op Assessment: Report given to RN and Post -op Vital signs reviewed and stable  Post vital signs: Reviewed and stable  Last Vitals:  Vitals Value Taken Time  BP 138/76 06/24/20 1202  Temp 36.1 C 06/24/20 1205  Pulse 62 06/24/20 1207  Resp 25 06/24/20 1207  SpO2 100 % 06/24/20 1207  Vitals shown include unvalidated device data.  Last Pain:  Vitals:   06/24/20 1205  TempSrc:   PainSc: 0-No pain         Complications: No complications documented.

## 2020-06-24 NOTE — Progress Notes (Signed)
  Day of Surgery Note    Subjective:  Pt seen about an hour ago in recovery and she was resting comfortably in pacu  Vitals:   06/24/20 1430 06/24/20 1500  BP: 118/72 (!) 147/88  Pulse: (!) 56 (!) 58  Resp: 19 19  Temp:  (!) 97.1 F (36.2 C)  SpO2: 100% 100%    Incisions:   Bilateral groins are soft without hematoma Extremities:  Palpable right DP and left PT pulses Cardiac:  regular Lungs:  Non labored Abdomen:  Soft, NT   Assessment/Plan:  This is a 60 y.o. female who is s/p  1) bilateral ultrasound guided common femoral artery access and large bore closure 2) coil embolization of right internal iliac artery aneurysm 3) endovascular aortic aneurysm repair 4) right external iliac artery extension with balloon expandable stenting 5) proximal aortic cuff extension for 1A endoleak   -pt doing well in recovery with palpable pedal pulses -to 4 east later today-anticipate discharge tomorrow if uneventful evening.    Leontine Locket, PA-C 06/24/2020 3:28 PM [phone]

## 2020-06-24 NOTE — Anesthesia Postprocedure Evaluation (Signed)
Anesthesia Post Note  Patient: [place]  Procedure(s) Performed: ABDOMINAL AORTIC ENDOVASCULAR STENT GRAFT (Bilateral Groin) ULTRASOUND GUIDANCE FOR VASCULAR ACCESS (Bilateral Groin) COIL EMBOLIZATION RIGHT INTERNAL ILIAC ARTERY (Right Groin)     Patient location during evaluation: PACU Anesthesia Type: General Level of consciousness: awake and alert Pain management: pain level controlled Vital Signs Assessment: post-procedure vital signs reviewed and stable Respiratory status: spontaneous breathing, nonlabored ventilation, respiratory function stable and patient connected to nasal cannula oxygen Cardiovascular status: blood pressure returned to baseline and stable Postop Assessment: no apparent nausea or vomiting Anesthetic complications: no   No complications documented.  Last Vitals:  Vitals:   06/24/20 1330 06/24/20 1400  BP: 116/72 (!) 141/80  Pulse: (!) 57 63  Resp: 16 16  Temp: (!) 36.1 C   SpO2: 98% 93%    Last Pain:  Vitals:   06/24/20 1400  TempSrc:   PainSc: 5                  Stacie Knutzen P Deandria Klute

## 2020-06-24 NOTE — Anesthesia Procedure Notes (Signed)
Arterial Line Insertion Start/End4/22/2022 7:00 AM, 06/24/2020 7:05 AM Performed by: Darral Dash, DO, Georgia Duff, CRNA, CRNA  Patient location: Pre-op. Preanesthetic checklist: patient identified, IV checked, site marked, risks and benefits discussed, surgical consent, monitors and equipment checked, pre-op evaluation, timeout performed and anesthesia consent Lidocaine 1% used for infiltration radial was placed Catheter size: 20 Fr Hand hygiene performed  and maximum sterile barriers used   Attempts: 1 Procedure performed without using ultrasound guided technique. Following insertion, dressing applied. Post procedure assessment: normal and unchanged  Patient tolerated the procedure well with no immediate complications.

## 2020-06-24 NOTE — Anesthesia Procedure Notes (Signed)
Procedure Name: Intubation Date/Time: 06/24/2020 8:02 AM Performed by: Georgia Duff, CRNA Pre-anesthesia Checklist: Patient identified, Emergency Drugs available, Suction available and Patient being monitored Patient Re-evaluated:Patient Re-evaluated prior to induction Oxygen Delivery Method: Circle System Utilized Preoxygenation: Pre-oxygenation with 100% oxygen Induction Type: IV induction Ventilation: Mask ventilation without difficulty Laryngoscope Size: Miller and 2 Grade View: Grade I Tube type: Oral Tube size: 7.0 mm Number of attempts: 1 Airway Equipment and Method: Stylet and Oral airway Placement Confirmation: ETT inserted through vocal cords under direct vision,  positive ETCO2 and breath sounds checked- equal and bilateral Secured at: 22 cm Tube secured with: Tape Dental Injury: Teeth and Oropharynx as per pre-operative assessment
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
## 2020-06-24 NOTE — Discharge Instructions (Signed)
  [facility]    Discharge Instructions  Endovascular Aortic Aneurysm Repair  Please refer to the following instructions for your post-procedure care. Your surgeon or Physician Assistant will discuss any changes with you.  Activity  You are encouraged to walk as much as you can. You can slowly return to normal activities but must avoid strenuous activity and heavy lifting until your doctor tells you it's OK. Avoid activities such as vacuuming or swinging a gold club. It is normal to feel tired for several weeks after your surgery. Do not drive until your doctor gives the OK and you are no longer taking prescription pain medications. It is also normal to have difficulty with sleep habits, eating, and bowel movements after surgery. These will go away with time.  Bathing/Showering  Shower daily after you go home.  Do not soak in a bathtub, hot tub, or swim until the incision heals completely.  If you have incisions in your groin, wash the groin wounds with soap and water daily and pat dry. (No tub bath-only shower)  Then put a dry gauze or washcloth there to keep this area dry to help prevent wound infection daily and as needed.  Do not use Vaseline or neosporin on your incisions.  Only use soap and water on your incisions and then protect and keep dry.  Incision Care  Shower every day. Clean your incision with mild soap and water. Pat the area dry with a clean towel. You do not need a bandage unless otherwise instructed. Do not apply any ointments or creams to your incision. If you clothing is irritating, you may cover your incision with a dry gauze pad.  Diet  Resume your normal diet. There are no special food restrictions following this procedure. A low fat/low cholesterol diet is recommended for all patients with vascular disease. In order to heal from your surgery, it is CRITICAL to get adequate nutrition. Your body requires vitamins, minerals, and protein.  Vegetables are the best source of vitamins and minerals. Vegetables also provide the perfect balance of protein. Processed food has little nutritional value, so try to avoid this.  Medications  Resume taking all of your medications unless your doctor or nurse practitioner tells you not to. If your incision is causing pain, you may take over-the-counter pain relievers such as acetaminophen (Tylenol). If you were prescribed a stronger pain medication, please be aware these medications can cause nausea and constipation. Prevent nausea by taking the medication with a snack or meal. Avoid constipation by drinking plenty of fluids and eating foods with a high amount of fiber, such as fruits, vegetables, and grains.  Do not take Tylenol if you are taking prescription pain medications.   Follow up  Our office will schedule a follow-up appointment with a CT scan 3-4 weeks after your surgery.  Please call us immediately for any of the following conditions  Severe or worsening pain in your legs or feet or in your abdomen back or chest. Increased pain, redness, drainage (pus) from your incision site. Increased abdominal pain, bloating, nausea, vomiting or persistent diarrhea. Fever of 101 degrees or higher. Swelling in your leg (s),  Reduce your risk of vascular disease  Stop smoking. If you would like help call [facility] at 1-800-QUIT-NOW (1-800-784-8669) or [place] at [phone]. Manage your cholesterol Maintain a desired weight Control your diabetes Keep your blood pressure down  If you have questions, please call the office at [phone].  

## 2020-06-24 NOTE — Progress Notes (Signed)
Pt received from PACU. VSS. Bilateral groins level 0. CHG complete. Pt oriented to room and unit. Call light in reach.  Clyde Canterbury, RN

## 2020-06-24 NOTE — Op Note (Signed)
DATE OF SERVICE: 06/24/2020  PATIENT:  Kathy Herrera  60 y.o. female  PRE-OPERATIVE DIAGNOSIS:  55mm infrarenal abdominal aortic aneurysm; 56mm right common iliac artery aneurysm, 69mm right internal iliac artery aneurysm  POST-OPERATIVE DIAGNOSIS:  Same  PROCEDURE:   1) bilateral ultrasound guided common femoral artery access and large bore closure 2) coil embolization of right internal iliac artery aneurysm 3) endovascular aortic aneurysm repair 4) right external iliac artery extension with balloon expandable stenting 5) proximal aortic cuff extension for 1A endoleak  SURGEON:  Surgeon(s) and Role:    * Cherre Robins, MD - Primary    * Angelia Mould, MD - Assisting  ASSISTANT: Deitra Mayo, MD  Paulo Fruit, PA-C  An assistant was required to facilitate exposure and expedite the case.  ANESTHESIA:   general  EBL: 1106mL  BLOOD ADMINISTERED:none  DRAINS: none   LOCAL MEDICATIONS USED:  NONE  SPECIMEN:  none  COUNTS: confirmed correct.  TOURNIQUET:  None  PATIENT DISPOSITION:  PACU - hemodynamically stable.   Delay start of Pharmacological VTE agent (>24hrs) due to surgical blood loss or risk of bleeding: no  INDICATION FOR PROCEDURE: Kathy Herrera is a 60 y.o. female with complex infrarenal abdominal aortic, and right iliac artery aneurysms. After careful discussion of risks, benefits, and alternatives the patient was offered endovascular repair. The patient understood and wished to proceed.  OPERATIVE FINDINGS:  Unremarkable coil embolization of right internal iliac artery (52mm Nestor coils x 7) 28.5 x 14.5 x 155mm Excluder main body delivered via RCFA access Deployed with anatomic gate position Vanche catheter required to select gate 16 x 20 x 135mm Gore left iliac extension limb deployed preserving the left hypo 16 x 12 x 141mm Gore right iliac extension limb deployed to previous coil embolization Right limb extended on to right external  iliac with 7x29mm VBX and 11x88mm VBX Completion angiogram revealed 1A endoleak which did not resolve with repeat angioplasty Extended proximally with 28.35mm Gore cuff with resolution of endoleak   DESCRIPTION OF PROCEDURE: After identification of the patient in the pre-operative holding area, the patient was transferred to the operating room. The patient was positioned supine on the operating room table. Anesthesia was induced. The groins were prepped and draped in standard fashion. A surgical pause was performed confirming correct patient, procedure, and operative location.  Using ultrasound guidance the common femoral arteries were accessed with micropuncture technique bilaterally.  The arteriotomy was dilated with a 6 French dilator and 2 Perclose sutures were placed into each common femoral artery at the 2:00 and 10 o'clock position.  The sutures were secured with clamps.  Access was upsized to 8 [place] by, and a 7 [place] by 35 cm sheath was introduced on the left.  Using an Omni Flush catheter aortogram was performed.  This revealed anatomy as expected by the preoperative CTA.  The right common iliac artery was selected with a floppy Glidewire.  The wire was then directed into the internal iliac artery.  Over the wire catheter was advanced.  The wire was exchanged for a Rosen wire.  The sheath was advanced into the origin of the right internal iliac artery.  A straight flush catheter was advanced over the wire.  Through the catheter seven 12 cm Nester coils were deployed into the right internal iliac artery aneurysm.  There is marked reduction in flow in the branches of the internal iliac artery which I felt with likely once antegrade flow was interrupted repeated endovascular aortic  repair.  The patient systemically heparinized with 10,000 units of IV heparin.  Clotting time measurements were used throughout the case to confirm adequate anticoagulation.  The left sheath was withdrawn.  Using  catheter and wire exchanges, a Lunderquist wire was advanced into the thoracic aorta from bilateral femoral access.  18 French dry seal sheath was introduced in the right.  12 French dry seal sheath was introduced on the left.  Omni Flush catheter was positioned at L2.  25 degree left anterior oblique projection and 5 degree cranial projection were obtained.  This 28.5 mm Gore excluder main body device was introduced from the right common femoral artery access positioned a refill of the takeoff of the renal arteries was likely to be.  Aorta was performed.  Renal arteries were marked.  The main body was partially deployed.  From the left femoral access I cannulated the gate of the main body using a banshee for catheter and floppy Glidewire.  A Omni Flush catheter was spun in the main body of the device to confirm proper cannulation.  We then exchanged for a Lunderquist wire.  Retrograde angiogram was performed of the left iliac arteries the iliac bifurcation.  The hypogastric origin was marked.  The distance from the main body to the hypogastric artery was measured.  A 16 x 20 x 115 mm Gore iliac limb was deployed in standard fashion to seal distally in the left common iliac artery.  We then completed deployment of the main body on the right.  Again a retrograde angiogram was performed of the right iliac arteries to define the iliac bifurcation.  By extended the right iliac limb with a 16 x 12 x 130mm Gore right iliac extension limb deployed to the area of previous coil embolization.  I then deployed a 7x36mm VBX in the external iliac artery and postdilated the portion overlapping the iliac limb with a 12 mm angioplasty balloon.  This did not achieve adequate seal and a type III endoleak was noted.  I bridged the leak with a 11x86mm VBX device.  Completion angiography revealed markedly improved flow.  There was still evidence of a very small type III leak.  I angioplastied this and made note to ensure there was  no leak on completion angiography.  Areas of proximal distal seal as well as overlap points were angioplastied with a 37 mm compliant MOB balloon.  Completion angiography was performed.  This revealed evidence of a 1A endoleak.  The type III endoleak had resolved.  There is no evidence of the type II endoleak.  I repeated angioplasty of the proximal seal point.  This did not resolve the 1 endoleak.  Because I had room to extend the repair I brought in a 28.5 mm Gore extension cuff and deployed this in standard fashion Julli below the origin of the renal vessels.  Patient angiography revealed preservation of flow through the renal vessels, the superior mesenteric artery, and resolution of the 1A endoleak.  Patient unfortunately received a large radiation dose of about 6500 mGy.  Suspect this is due to her central obesity and multiple fluoroscopic rents needed to seal too difficult to feel points. She will need to be inspected for dermatitis.  256 cc of contrast were administered.  Sheaths were withdrawn and manual pressure held to the groins.  The previously placed Perclose sutures were secured using standard technique.  This rendered the arteriotomy was hemostatic.  The wires were removed.  The sutures were secured.  Manual pressure  was held for 5 minutes.  Hemostasis was assured.  A Monocryl suture was used to secure the incisions in the groin.  Dermabond was applied.  Doppler flow was confirmed in the feet before heparin was reversed with protamine.  Upon completion of the case instrument and sharps counts were confirmed correct. The patient was transferred to the PACU in good condition. I was present for all portions of the procedure.  Rande Brunt. Lenell Antu, MD [facility] Phone Number: [phone] 06/24/2020 11:52 AM

## 2020-06-25 LAB — CBC
HCT: 40.8 % (ref 36.0–46.0)
Hemoglobin: 12.5 g/dL (ref 12.0–15.0)
MCH: 29.3 pg (ref 26.0–34.0)
MCHC: 30.6 g/dL (ref 30.0–36.0)
MCV: 95.6 fL (ref 80.0–100.0)
Platelets: 160 10*3/uL (ref 150–400)
RBC: 4.27 MIL/uL (ref 3.87–5.11)
RDW: 14.7 % (ref 11.5–15.5)
WBC: 12.8 10*3/uL — ABNORMAL HIGH (ref 4.0–10.5)
nRBC: 0 % (ref 0.0–0.2)

## 2020-06-25 LAB — BASIC METABOLIC PANEL
Anion gap: 4 — ABNORMAL LOW (ref 5–15)
BUN: 13 mg/dL (ref 8–23)
CO2: 32 mmol/L (ref 22–32)
Calcium: 8.3 mg/dL — ABNORMAL LOW (ref 8.9–10.3)
Chloride: 100 mmol/L (ref 98–111)
Creatinine, Ser: 1.14 mg/dL — ABNORMAL HIGH (ref 0.44–1.00)
GFR, Estimated: 55 mL/min — ABNORMAL LOW (ref >60)
Glucose, Bld: 116 mg/dL — ABNORMAL HIGH (ref 70–99)
Potassium: 3.3 mmol/L — ABNORMAL LOW (ref 3.5–5.1)
Sodium: 136 mmol/L (ref 135–145)

## 2020-06-25 MED ORDER — TRAMADOL HCL 50 MG PO TABS: 50.0000 mg | ORAL_TABLET | Freq: Four times a day (QID) | ORAL | Status: DC | PRN

## 2020-06-25 MED ORDER — TRAMADOL HCL 50 MG PO TABS: 50.0000 mg | ORAL_TABLET | Freq: Four times a day (QID) | ORAL | 0 refills | Status: DC | PRN

## 2020-06-25 NOTE — Progress Notes (Addendum)
  Progress Note    06/25/2020 7:43 AM 1 Day Post-Op  Subjective:  Says she is surprised she hasn't had much pain.  Says she hasn't voided as they removed the catheter this morning. Hasn't been out of bed yet.  Says she was nauseated yesterday-says she ate [employer] chicken.   Tm 99.7 HR 60's-80's NSR 009'F-818'E systolic 99% RA  Vitals:   06/25/20 0300 06/25/20 0700  BP: 112/67 130/79  Pulse: 83 86  Resp: 20 20  Temp: 98 F (36.7 C) 99.7 F (37.6 C)  SpO2: 97% 97%    Physical Exam: Cardiac:  regular Lungs:  Non labored Incisions:  Bilateral groins are soft without hematoma Extremities:  Bilateral feet are warm and well perfused with palpable right DP and left PT Abdomen:  Soft, NT/ND  CBC    Component Value Date/Time   WBC 12.8 (H) 06/25/2020 0522   RBC 4.27 06/25/2020 0522   HGB 12.5 06/25/2020 0522   HCT 40.8 06/25/2020 0522   PLT 160 06/25/2020 0522   MCV 95.6 06/25/2020 0522   MCH 29.3 06/25/2020 0522   MCHC 30.6 06/25/2020 0522   RDW 14.7 06/25/2020 0522   LYMPHSABS 3.8 12/29/2018 0803   MONOABS 0.7 12/29/2018 0803   EOSABS 0.3 12/29/2018 0803   BASOSABS 0.0 12/29/2018 0803    BMET    Component Value Date/Time   NA 136 06/25/2020 0522   K 3.3 (L) 06/25/2020 0522   CL 100 06/25/2020 0522   CO2 32 06/25/2020 0522   GLUCOSE 116 (H) 06/25/2020 0522   BUN 13 06/25/2020 0522   CREATININE 1.14 (H) 06/25/2020 0522   CALCIUM 8.3 (L) 06/25/2020 0522   GFRNONAA 55 (L) 06/25/2020 0522   GFRAA 60 (L) 07/20/2015 0520    INR    Component Value Date/Time   INR 1.3 (H) 06/24/2020 1300     Intake/Output Summary (Last 24 hours) at 06/25/2020 0743 Last data filed at 06/25/2020 0515 Gross per 24 hour  Intake 3145.83 ml  Output 1700 ml  Net 1445.83 ml     Assessment:  60 y.o. 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  She has palpable pedal pulses.  -she did have some nausea yesterday but this is improved this morning.  -foley removed this morning-pt needs to void prior to discharge -needs to mobilize and walk prior to discharge -pt does not want percocet for discharge as it makes her nauseated-discussed we will give her Tramadol. -f/u in 4 weeks with CTA protocol.   Leontine Locket, PA-C [facility] [phone] 06/25/2020 7:43 AM  VASCULAR STAFF ADDENDUM: I have independently interviewed and examined the patient. I agree with the above.  Ready for DC s/p EVAR No evidence of radiation dermatitis on exam today Follow up with me in 4 weeks with CTA A/P.  Yevonne Aline. Stanford Breed, MD [facility] Phone Number: [phone] 06/25/2020 10:31 AM

## 2020-06-25 NOTE — Progress Notes (Signed)
Patient given instructions on use of incentive spirometer.  Ambulated in hall for approximately 100 ft.  Tol. fair with frequent rest periods and becomes easily dyspneic.  Ambulated with walker, though uses cane at home.  Patient discharged at 1150 to private residence with daughter.  Escorted to exit via wheelchair by nurse tech.

## 2020-06-25 NOTE — Discharge Summary (Signed)
EVAR Discharge Summary   Kathy Herrera [birth year] 60 y.o. female  MRN: [id number]  Admission Date: 06/24/2020  Discharge Date: 06/25/2020  Physician: Cherre Robins, MD  Admission Diagnosis: Abdominal aortic aneurysm (AAA) [facility]) [I71.4]   HPI:   This is a 60 y.o. female with a infrarenal abdominal aortic aneurysm (53mm) and right common iliac artery aneurysm (27mm).  A statement from the [place] [employer] and Society for Vascular Surgery estimated the annual rupture risk according to AAA diameter to be the following: 4.0 cm to 4.9 cm in diameter - 0.5% to 5%  The patient is a candidate for elective repair of the aneurysm to prevent rupture.  I explained the risks / benefits / alternatives to different approaches to aortic reconstruction.   I explained the specific benefits of open repair including improved durability, limited requirement for surveillance, less need for secondary intervention. I explained the specific risks from open repair including higher physiologic stress from aortic cross clamping, higher risk of perioperative complication (including stroke, MI, pneumonia, renal insufficiency and failure, etc.), higher risk of abdominal wall complications, risk of anastomotic pseudoaneurysm.  I explained the specific benefits of endovascular repair including limited physiologic stress and less recovery time, lower risk of serious perioperative complication, zero risk of abdominal wall complication.  I explained the specific risks from endovascular repair including need for lifetime surveillance, risk of large-bore arterial access, risk of requiring secondary intervention to maintain seal or patency. I explained that not all patients are candidates for endovascular repair based on their unique anatomy.  After detailed discussion the patient and I agree that the best option for the patient is endovascular repair.   Recommend  the following to reduce the risk of major adverse cardiac / limb events.  Complete cessation from all tobacco products. Excellent blood glucose control with goal A1c < 7%. Blood pressure control with goal blood pressure < 140/90 mmHg. Excellent lipid reduction therapy with goal LDL-C <100 mg/dL. Aspirin 81mg  PO QD.  Atorvastatin 40-80mg  PO QD (or other "high intensity" statin therapy).  Plan coil embolization of right internal iliac artery, EVAR with REIA extension 05/27/20.  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 3) endovascular aortic aneurysm repair 4) right external iliac artery extension with balloon expandable stenting 5) proximal aortic cuff extension for 1A endoleak    Findings: Unremarkable coil embolization of right internal iliac artery (79mm Nestor coils x 7) 28.5 x 14.5 x 147mm Excluder main body delivered via RCFA access Deployed with anatomic gate position Vanche catheter required to select gate 16 x 20 x 114mm Gore left iliac extension limb deployed preserving the left hypo 16 x 12 x 147mm Gore right iliac extension limb deployed to previous coil embolization Right limb extended on to right external iliac with 7x30mm VBX and 11x39mm VBX Completion angiogram revealed 1A endoleak which did not resolve with repeat angioplasty Extended proximally with 28.4mm Gore cuff with resolution of endoleak  The pt tolerated the procedure well and was transported to the PACU in good condition.  By POD 1, pt was doing well with palpable pedal pulses.  There was no evidence of radiation dermatitis on exam.   She was able to void and ambulate and pain was well controlled.  She was discharged home  later that day.    CBC    Component Value Date/Time   WBC 12.8  (H) 06/25/2020 0522   RBC 4.27 06/25/2020 0522   HGB 12.5 06/25/2020 0522   HCT 40.8 06/25/2020 0522   PLT 160 06/25/2020 0522   MCV 95.6 06/25/2020 0522   MCH 29.3 06/25/2020 0522   MCHC 30.6 06/25/2020 0522   RDW 14.7 06/25/2020 0522   LYMPHSABS 3.8 12/29/2018 0803   MONOABS 0.7 12/29/2018 0803   EOSABS 0.3 12/29/2018 0803   BASOSABS 0.0 12/29/2018 0803    BMET    Component Value Date/Time   NA 136 06/25/2020 0522   K 3.3 (L) 06/25/2020 0522   CL 100 06/25/2020 0522   CO2 32 06/25/2020 0522   GLUCOSE 116 (H) 06/25/2020 0522   BUN 13 06/25/2020 0522   CREATININE 1.14 (H) 06/25/2020 0522   CALCIUM 8.3 (L) 06/25/2020 0522   GFRNONAA 55 (L) 06/25/2020 0522   GFRAA 60 (L) 07/20/2015 0520       Discharge Instructions    Discharge patient   Complete by: As directed    Discharge pt once she has voided and walked.   Discharge disposition: 01-Home or Self Care   Discharge patient date: 06/25/2020      Discharge Diagnosis:  Abdominal aortic aneurysm (AAA) [facility]) [I71.4]  Secondary Diagnosis: Patient Active Problem List   Diagnosis Date Noted  . Abdominal aortic aneurysm (AAA) ([place]) 06/24/2020  . Personal history of colonic polyps 06/01/2020  . Benign neoplasm of colon 06/01/2020  . Aortic aneurysm ([place]) 06/01/2020  . 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[place]) 06/01/2020  . Arthritis of left hip 04/18/2020  . LSC (lichen simplex chronicus) 09/11/2019  . Vitamin D deficiency 12/28/2018  . Elevated hemoglobin ([place]) 10/29/2018  . Crohn's disease of ileum, unspecified complication ([place]) 29/79/8921  . ASCVD (arteriosclerotic cardiovascular disease) 10/28/2018  . Healthcare maintenance 08/21/2016  . Enterocolitis 07/14/2015  . Perforated viscus 07/14/2015  . Hyperthyroidism 12/23/2014  . Spinal stenosis, lumbar region, with neurogenic claudication 02/02/2014    Class: Chronic  . Spondylolisthesis of lumbar region 02/02/2014    Class: Chronic  . Lumbar degenerative  disc disease 02/02/2014    Class: Chronic  . Morbid obesity ([place]) 02/11/2013  . Tobacco abuse 09/17/2012  . Essential hypertension   . Hyperlipidemia    Past Medical History:  Diagnosis Date  . Crohn disease ([place])   . Depression    History. No current problems  . DJD (degenerative joint disease) of lumbar spine   . Dyspnea    With Activity  . GERD (gastroesophageal reflux disease)    no current problems  . Heart murmur    patient denies, states she was "diagnosed in [place], no follow up here"  . Heart murmur    no current problems  . Hyperlipidemia   . Hypertension   . Hyperthyroidism   . Lactose intolerance   . Perforated viscus 07/2015  . Rheumatoid arthritis ([place])   . STD (sexually transmitted disease)    Condyloma  . Thyroid disorder   . Vitamin D deficiency      Allergies as of 06/25/2020      Reactions   Shrimp [shellfish Allergy] Hives   May be seasoning not always   Percocet [oxycodone-acetaminophen] Nausea Only      Medication List    STOP taking these medications   traMADol-acetaminophen 37.5-325 MG tablet Commonly known as: ULTRACET     TAKE these medications   acetaminophen 500  MG tablet Commonly known as: TYLENOL Take 1,000 mg by mouth every 6 (six) hours as needed (Arthritis pain).   albuterol 108 (90 Base) MCG/ACT inhaler Commonly known as: VENTOLIN HFA Inhale 2 puffs into the lungs every 6 (six) hours as needed for wheezing or shortness of breath. What changed: reasons to take this   aspirin EC 81 MG tablet Take 81 mg by mouth daily.   atenolol 100 MG tablet Commonly known as: TENORMIN TAKE 1 TABLET EVERY DAY   cholecalciferol 25 MCG (1000 UNIT) tablet Commonly known as: VITAMIN D Take 1,000 Units by mouth daily.   hydrochlorothiazide 25 MG tablet Commonly known as: HYDRODIURIL TAKE 1 TABLET EVERY DAY   lisinopril 40 MG tablet Commonly known as: ZESTRIL TAKE 1 TABLET EVERY DAY   methimazole 5 MG tablet Commonly known as:  TAPAZOLE TAKE 1/2 TABLET THREE TIMES WEEKLY   multivitamin with minerals tablet Take 1 tablet by mouth daily.   potassium chloride 10 MEQ tablet Commonly known as: KLOR-CON TAKE 1 TABLET EVERY DAY   rosuvastatin 10 MG tablet Commonly known as: Crestor Take 1 tablet (10 mg total) by mouth daily.   traMADol 50 MG tablet Commonly known as: Ultram Take 1 tablet (50 mg total) by mouth every 6 (six) hours as needed.   traZODone 50 MG tablet Commonly known as: DESYREL Take 0.5-1 tablets (25-50 mg total) by mouth at bedtime as needed for sleep.   triamcinolone ointment 0.1 % Commonly known as: KENALOG APPLY DAILY TO RASH ON NAPE ONLY AS NEEDED.       Discharge Instructions:  [facility]  Discharge Instructions Endovascular Aortic Aneurysm Repair  Please refer to the following instructions for your post-procedure care. Your surgeon or Physician Assistant will discuss any changes with you.  Activity  You are encouraged to walk as much as you can. You can slowly return to normal activities but must avoid strenuous activity and heavy lifting until your doctor tells you it's OK. Avoid activities such as vacuuming or swinging a gold club. It is normal to feel tired for several weeks after your surgery. Do not drive until your doctor gives the OK and you are no longer taking prescription pain medications. It is also normal to have difficulty with sleep habits, eating, and bowel movements after surgery. These will go away with time.  Bathing/Showering  You may shower after you go home. If you have an incision, do not soak in a bathtub, hot tub, or swim until the incision heals completely.  Incision Care  Shower every day. Clean your incision with mild soap and water. Pat the area dry with a clean towel. You do not need a bandage unless otherwise instructed. Do not apply any ointments or creams to your incision. If you clothing is irritating, you may cover  your incision with a dry gauze pad.  Diet  Resume your normal diet. There are no special food restrictions following this procedure. A low fat/low cholesterol diet is recommended for all patients with vascular disease. In order to heal from your surgery, it is CRITICAL to get adequate nutrition. Your body requires vitamins, minerals, and protein. Vegetables are the best source of vitamins and minerals. Vegetables also provide the perfect balance of protein. Processed food has little nutritional value, so try to avoid this.  Medications  Resume taking all of your medications unless your doctor or Physician Assistnat tells you not to. If your incision is causing pain, you may take over-the-counter  pain relievers such as acetaminophen (Tylenol). If you were prescribed a stronger pain medication, please be aware these medications can cause nausea and constipation. Prevent nausea by taking the medication with a snack or meal. Avoid constipation by drinking plenty of fluids and eating foods with a high amount of fiber, such as fruits, vegetables, and grains.  Do not take Tylenol if you are taking prescription pain medications.   Follow up  [place] office will schedule a follow-up appointment with a C.T. scan 3-4 weeks after your surgery.  Please call us immediately for any of the following conditions  . Severe or worsening pain in your legs or feet or in your abdomen back or chest. . Increased pain, redness, drainage (pus) from your incision sit. . Increased abdominal pain, bloating, nausea, vomiting or persistent diarrhea. . Fever of 101 degrees or higher. . Swelling in your leg (s), .  Reduce your risk of vascular disease  .Stop smoking. If you would like help call [facility] at 1-800-QUIT-NOW (515) 023-4645) or [place] at 2068641426. .Manage your cholesterol .Maintain a desired weight .Control your diabetes .Keep your blood pressure down  If you have questions, please call the office  at [phone].   Prescriptions given: 1.  Tramadol #8 No Refill  Disposition: home  Patient's condition: is Good  Follow up: 1. Dr. Stanford Breed in 4 weeks with CTA protocol   Leontine Locket, PA-C [facility] [phone] 06/25/2020  10:25 AM   - For VQI Registry use - Post-op:  Time to Extubation: [x]  In OR, [ ]  < 12 hrs, [ ]  12-24 hrs, [ ]  >=24 hrs Vasopressors Req. Post-op: No MI: No., [ ]  Troponin only, [ ]  EKG or Clinical New Arrhythmia: No CHF: No ICU Stay: 1 day in progressive Transfusion: No     If yes, n/a units given  Complications: Resp failure: No., [ ]  Pneumonia, [ ]  Ventilator Chg in renal function: No., [ ]  Inc. Cr > 0.5, [ ]  Temp. Dialysis,  [ ]  Permanent dialysis Leg ischemia: No., no Surgery needed, [ ]  Yes, Surgery needed,  [ ]  Amputation Bowel ischemia: No., [ ]  Medical Rx, [ ]  Surgical Rx Wound complication: No., [ ]  Superficial separation/infection, [ ]  Return to OR Return to OR: No  Return to OR for bleeding: No Stroke: No., [ ]  Minor, [ ]  Major  Discharge medications: Statin use:  Yes  ASA use:  Yes  Plavix use:  No  Beta blocker use:  Yes  ARB use:  No ACEI use:  Yes CCB use:  No

## 2020-06-25 NOTE — Plan of Care (Signed)
  Problem: Education: Goal: Knowledge of General Education information will improve Description: Including pain rating scale, medication(s)/side effects and non-pharmacologic comfort measures Outcome: Completed/Met   Problem: Health Behavior/Discharge Planning: Goal: Ability to manage health-related needs will improve Outcome: Completed/Met   Problem: Clinical Measurements: Goal: Ability to maintain clinical measurements within normal limits will improve Outcome: Completed/Met Goal: Will remain free from infection Outcome: Completed/Met Goal: Diagnostic test results will improve Outcome: Completed/Met Goal: Respiratory complications will improve Outcome: Completed/Met Goal: Cardiovascular complication will be avoided Outcome: Completed/Met   Problem: Activity: Goal: Risk for activity intolerance will decrease Outcome: Completed/Met   Problem: Nutrition: Goal: Adequate nutrition will be maintained Outcome: Completed/Met   Problem: Coping: Goal: Level of anxiety will decrease Outcome: Completed/Met   Problem: Elimination: Goal: Will not experience complications related to bowel motility Outcome: Completed/Met Goal: Will not experience complications related to urinary retention Outcome: Completed/Met   Problem: Pain Managment: Goal: General experience of comfort will improve Outcome: Completed/Met   Problem: Safety: Goal: Ability to remain free from injury will improve Outcome: Completed/Met   Problem: Skin Integrity: Goal: Risk for impaired skin integrity will decrease Outcome: Completed/Met   Discharge instructions reviewed with patient.  These included, but were not limited to, the following:  discharge medications, groin site care, when to call the MD, activity restrictions, smoking cessation recommendations, prescriptions, etc.

## 2020-06-27 ENCOUNTER — Other Ambulatory Visit: Payer: Self-pay | Admitting: Family

## 2020-06-27 ENCOUNTER — Encounter (HOSPITAL_COMMUNITY): Payer: Self-pay | Admitting: Vascular Surgery

## 2020-06-27 DIAGNOSIS — I2510 Atherosclerotic heart disease of native coronary artery without angina pectoris: Secondary | ICD-10-CM

## 2020-06-27 DIAGNOSIS — I10 Essential (primary) hypertension: Secondary | ICD-10-CM

## 2020-06-27 LAB — POCT ACTIVATED CLOTTING TIME
Activated Clotting Time: 285 seconds
Activated Clotting Time: 315 seconds
Activated Clotting Time: 243 seconds
Activated Clotting Time: 273 seconds

## 2020-06-28 ENCOUNTER — Telehealth: Payer: Self-pay

## 2020-06-28 ENCOUNTER — Other Ambulatory Visit: Payer: Self-pay

## 2020-06-28 DIAGNOSIS — J40 Bronchitis, not specified as acute or chronic: Secondary | ICD-10-CM

## 2020-06-28 MED ORDER — ALBUTEROL SULFATE HFA 108 (90 BASE) MCG/ACT IN AERS: 2.0000 | INHALATION_SPRAY | Freq: Four times a day (QID) | RESPIRATORY_TRACT | 0 refills | Status: DC | PRN

## 2020-06-28 NOTE — Telephone Encounter (Signed)
Kennitra from [facility] is requesting a refill for pt. albuterol (VENTOLIN HFA) 108 (90 Base) MCG/ACT inhaler   Send med to [facility]  Thanks

## 2020-06-28 NOTE — Telephone Encounter (Signed)
Refill sent in

## 2020-07-08 ENCOUNTER — Telehealth: Payer: Self-pay

## 2020-07-08 NOTE — Telephone Encounter (Signed)
Pt called to ask if she could drive s/p EVAR [birth year]. She confirmed she is no longer on pain medications and has been advised she can drive. No further questions/concers at this time.

## 2020-07-13 ENCOUNTER — Ambulatory Visit: Payer: Medicare HMO | Admitting: Specialist

## 2020-07-13 ENCOUNTER — Encounter: Payer: Self-pay | Admitting: Specialist

## 2020-07-13 VITALS — BP 108/69 | HR 72 | Ht 67.0 in | Wt 270.0 lb

## 2020-07-13 DIAGNOSIS — G5602 Carpal tunnel syndrome, left upper limb: Secondary | ICD-10-CM | POA: Diagnosis not present

## 2020-07-13 DIAGNOSIS — M5136 Other intervertebral disc degeneration, lumbar region: Secondary | ICD-10-CM

## 2020-07-13 DIAGNOSIS — G5601 Carpal tunnel syndrome, right upper limb: Secondary | ICD-10-CM | POA: Diagnosis not present

## 2020-07-13 DIAGNOSIS — G5603 Carpal tunnel syndrome, bilateral upper limbs: Secondary | ICD-10-CM

## 2020-07-13 DIAGNOSIS — I713 Abdominal aortic aneurysm, ruptured, unspecified: Secondary | ICD-10-CM

## 2020-07-13 DIAGNOSIS — Z981 Arthrodesis status: Secondary | ICD-10-CM | POA: Diagnosis not present

## 2020-07-13 DIAGNOSIS — M4316 Spondylolisthesis, lumbar region: Secondary | ICD-10-CM

## 2020-07-13 DIAGNOSIS — M1612 Unilateral primary osteoarthritis, left hip: Secondary | ICD-10-CM

## 2020-07-13 DIAGNOSIS — M4807 Spinal stenosis, lumbosacral region: Secondary | ICD-10-CM

## 2020-07-13 DIAGNOSIS — M25552 Pain in left hip: Secondary | ICD-10-CM | POA: Diagnosis not present

## 2020-07-13 DIAGNOSIS — K50019 Crohn's disease of small intestine with unspecified complications: Secondary | ICD-10-CM | POA: Diagnosis not present

## 2020-07-13 NOTE — Patient Instructions (Addendum)
Plan: Avoid bending, stooping and avoid lifting weights greater than 10 lbs. Avoid prolong standing and walking. Order for a new walker with wheels. You are recovering from aortic aneurysm treatment and a follow up CT of the abdomen is to be done  To evaluate yours healing. After this CT scan we can consider scheduling your back surgery. Surgery scheduling secretary Kandice Hams, will call you in the next week to schedule for surgery.  Surgery recommended is a one level lumbar fusion L3-4 extension from previous L4-S1 fusion this would be done with rods, screws and cages with local bone graft and allograft (donor bone graft). Take hydrocodone for for pain. Risk of surgery includes risk of infection 1 in 200 patients, bleeding 1/2% chance you would need a transfusion.   Risk to the nerves is one in 10,000. You will need to use a brace for 3 months and wean from the brace on the 4th month. Expect improved walking and standing tolerance. Expect relief of leg pain but numbness may persist depending on the length and degree of pressure that has been present. EMG/NCV of the wrists is ordered to assess for CTS. Evaluation of left hip for Osteoarthritis and possible need for eventual replacement.    Carpal Tunnel Syndrome  Carpal tunnel syndrome is a condition that causes pain in your hand and arm. The carpal tunnel is a narrow area located on the palm side of your wrist. Repeated wrist motion or certain diseases may cause swelling within the tunnel. This swelling pinches the main nerve in the wrist (median nerve). What are the causes? This condition may be caused by:  Repeated wrist motions.  Wrist injuries.  Arthritis.  A cyst or tumor in the carpal tunnel.  Fluid buildup during pregnancy. Sometimes the cause of this condition is not known. What increases the risk? This condition is more likely to develop in:  People who have jobs that cause them to repeatedly move their wrists in the  same motion, such as Art gallery manager.  Women.  People with certain conditions, such as: ? Diabetes. ? Obesity. ? An underactive thyroid (hypothyroidism). ? Kidney failure. What are the signs or symptoms? Symptoms of this condition include:  A tingling feeling in your fingers, especially in your thumb, index, and middle fingers.  Tingling or numbness in your hand.  An aching feeling in your entire arm, especially when your wrist and elbow are bent for long periods of time.  Wrist pain that goes up your arm to your shoulder.  Pain that goes down into your palm or fingers.  A weak feeling in your hands. You may have trouble grabbing and holding items. Your symptoms may feel worse during the night. How is this diagnosed? This condition is diagnosed with a medical history and physical exam. You may also have tests, including:  An electromyogram (EMG). This test measures electrical signals sent by your nerves into the muscles.  X-rays. How is this treated? Treatment for this condition includes:  Lifestyle changes. It is important to stop doing or modify the activity that caused your condition.  Physical or occupational therapy.  Medicines for pain and inflammation. This may include medicine that is injected into your wrist.  A wrist splint.  Surgery. Follow these instructions at home: If you have a splint:   Wear it as told by your health care provider. Remove it only as told by your health care provider.  Loosen the splint if your fingers become numb and tingle, or  if they turn cold and blue.  Keep the splint clean and dry. General instructions   Take over-the-counter and prescription medicines only as told by your health care provider.  Rest your wrist from any activity that may be causing your pain. If your condition is work related, talk to your employer about changes that can be made, such as getting a wrist pad to use while typing.  If directed, apply ice  to the painful area: ? Put ice in a plastic bag. ? Place a towel between your skin and the bag. ? Leave the ice on for 20 minutes, 2-3 times per day.  Keep all follow-up visits as told by your health care provider. This is important.  Do any exercises as told by your health care provider, physical therapist, or occupational therapist. Contact a health care provider if:  You have new symptoms.  Your pain is not controlled with medicines.  Your symptoms get worse. This information is not intended to replace advice given to you by your health care provider. Make sure you discuss any questions you have with your health care provider. Document Released: 02/17/2000 Document Revised: 06/30/2015 Document Reviewed: 10/31/2016 Elsevier Interactive Patient Education  2017 [employer].

## 2020-07-13 NOTE — Progress Notes (Addendum)
Office Visit Note   Patient: Kathy Herrera           Date of Birth: Aug 31, 1960           MRN: [id number] Visit Date: 07/13/2020              Requested by: Libby Maw, MD [address] [place],  [place] [address] PCP: Libby Maw, MD   Assessment & Plan: Visit Diagnoses:  1. Carpal tunnel syndrome, bilateral   2. Lumbar degenerative disc disease   3. Spondylolisthesis of lumbar region   4. Spinal stenosis of lumbosacral region   5. History of lumbar spinal fusion   6. Abdominal aortic aneurysm, ruptured (HCC)   7. Pain in left hip   8. Unilateral primary osteoarthritis, left hip     Plan: Avoid bending, stooping and avoid lifting weights greater than 10 lbs. Avoid prolong standing and walking. Order for a new walker with wheels. You are recovering from aortic aneurysm treatment and a follow up CT of the abdomen is to be done  To evaluate yours healing. After this CT scan we can consider scheduling your back surgery. Surgery scheduling secretary Kandice Hams, will call you in the next week to schedule for surgery.  Surgery recommended is a one level lumbar fusion L3-4 extension from previous L4-S1 fusion this would be done with rods, screws and cages with local bone graft and allograft (donor bone graft). Take hydrocodone for for pain. Risk of surgery includes risk of infection 1 in 200 patients, bleeding 1/2% chance you would need a transfusion.   Risk to the nerves is one in 10,000. You will need to use a brace for 3 months and wean from the brace on the 4th month. Expect improved walking and standing tolerance. Expect relief of leg pain but numbness may persist depending on the length and degree of pressure that has been present. EMG/NCV of the wrists is ordered to assess for CTS. Evaluation of left hip for Osteoarthritis and possible need for eventual replacement.  Carpal Tunnel Syndrome  Carpal tunnel syndrome is a condition that causes  pain in your hand and arm. The carpal tunnel is a narrow area located on the palm side of your wrist. Repeated wrist motion or certain diseases may cause swelling within the tunnel. This swelling pinches the main nerve in the wrist (median nerve). What are the causes? This condition may be caused by:  Repeated wrist motions.  Wrist injuries.  Arthritis.  A cyst or tumor in the carpal tunnel.  Fluid buildup during pregnancy. Sometimes the cause of this condition is not known. What increases the risk? This condition is more likely to develop in:  People who have jobs that cause them to repeatedly move their wrists in the same motion, such as Art gallery manager.  Women.  People with certain conditions, such as: ? Diabetes. ? Obesity. ? An underactive thyroid (hypothyroidism). ? Kidney failure. What are the signs or symptoms? Symptoms of this condition include:  A tingling feeling in your fingers, especially in your thumb, index, and middle fingers.  Tingling or numbness in your hand.  An aching feeling in your entire arm, especially when your wrist and elbow are bent for long periods of time.  Wrist pain that goes up your arm to your shoulder.  Pain that goes down into your palm or fingers.  A weak feeling in your hands. You may have trouble grabbing and holding items. Your symptoms may feel  worse during the night. How is this diagnosed? This condition is diagnosed with a medical history and physical exam. You may also have tests, including:  An electromyogram (EMG). This test measures electrical signals sent by your nerves into the muscles.  X-rays. How is this treated? Treatment for this condition includes:  Lifestyle changes. It is important to stop doing or modify the activity that caused your condition.  Physical or occupational therapy.  Medicines for pain and inflammation. This may include medicine that is injected into your wrist.  A wrist  splint.  Surgery. Follow these instructions at home: If you have a splint:   Wear it as told by your health care provider. Remove it only as told by your health care provider.  Loosen the splint if your fingers become numb and tingle, or if they turn cold and blue.  Keep the splint clean and dry. General instructions   Take over-the-counter and prescription medicines only as told by your health care provider.  Rest your wrist from any activity that may be causing your pain. If your condition is work related, talk to your employer about changes that can be made, such as getting a wrist pad to use while typing.  If directed, apply ice to the painful area: ? Put ice in a plastic bag. ? Place a towel between your skin and the bag. ? Leave the ice on for 20 minutes, 2-3 times per day.  Keep all follow-up visits as told by your health care provider. This is important.  Do any exercises as told by your health care provider, physical therapist, or occupational therapist. Contact a health care provider if:  You have new symptoms.  Your pain is not controlled with medicines.  Your symptoms get worse. This information is not intended to replace advice given to you by your health care provider. Make sure you discuss any questions you have with your health care provider. Document Released: 02/17/2000 Document Revised: 06/30/2015 Document Reviewed: 10/31/2016 Elsevier Interactive Patient Education  2017 [place] Instructions: No follow-ups on file.   Orders:  No orders of the defined types were placed in this encounter.  No orders of the defined types were placed in this encounter.     Procedures: No procedures performed   Clinical Data: No additional findings.   Subjective: Chief Complaint  Patient presents with  . Lower Back - Follow-up    60 year old female with history of lumbar spinal stenosis and previous lumbar fusion L4-5 and L5-S1. She has  pain in the back and legs with standing and walking and left hip Osteoarthritis. She has recently undergone a percutaneous treatment of an infrarenal aneurysm and she is now recovered about 2 weeks out. She is to undergo a repeat CT scan.  Complains of numbness and tingl ing in both hands radial 3 fingers since undergoing the procedure.   Review of Systems  Constitutional: Negative.   HENT: Negative.   Eyes: Negative.   Respiratory: Negative.   Cardiovascular: Negative.   Gastrointestinal: Negative.   Endocrine: Negative.   Genitourinary: Negative.   Musculoskeletal: Negative.   Skin: Negative.   Allergic/Immunologic: Negative.   Neurological: Negative.   Hematological: Negative.   Psychiatric/Behavioral: Negative.      Objective: Vital Signs: BP 108/69 (BP Location: Left Arm, Patient Position: Sitting)   Pulse 72   Ht 5\' 7"  (1.702 m)   Wt 270 lb (122.5 kg)   BMI 42.29 kg/m   Physical  Exam Constitutional:      Appearance: She is well-developed.  HENT:     Head: Normocephalic and atraumatic.  Eyes:     Pupils: Pupils are equal, round, and reactive to light.  Pulmonary:     Effort: Pulmonary effort is normal.     Breath sounds: Normal breath sounds.  Abdominal:     General: Bowel sounds are normal.     Palpations: Abdomen is soft.  Musculoskeletal:     Cervical back: Normal range of motion and neck supple.     Lumbar back: Negative right straight leg raise test and negative left straight leg raise test.  Skin:    General: Skin is warm and dry.  Neurological:     Mental Status: She is alert and oriented to person, place, and time.  Psychiatric:        Behavior: Behavior normal.        Thought Content: Thought content normal.        Judgment: Judgment normal.     Back Exam   Tenderness  The patient is experiencing tenderness in the lumbar.  Range of Motion  Extension: abnormal  Flexion: normal  Lateral bend right: abnormal  Lateral bend left: abnormal   Rotation right: abnormal  Rotation left: abnormal   Muscle Strength  Right Quadriceps:  5/5  Left Quadriceps:  5/5  Right Hamstrings:  5/5  Left Hamstrings:  5/5   Tests  Straight leg raise right: negative Straight leg raise left: negative  Other  Toe walk: normal Heel walk: normal Gait: abductor lurch   Comments:  Left hip arthrosis  Decreased ROM and pain into the right buttock.   Right Hand Exam   Tests  Phalen's Sign: negative Tinel's sign (median nerve): positive   Left Hand Exam   Tests  Phalen's Sign: negative Tinel's sign (median nerve): positive  Comments:  Numbness in the hands is becoming more constant.      Specialty Comments:  No specialty comments available.  Imaging: No results found.   PMFS History: Patient Active Problem List   Diagnosis Date Noted  . Spinal stenosis, lumbar region, with neurogenic claudication 02/02/2014    Priority: High    Class: Chronic  . Spondylolisthesis of lumbar region 02/02/2014    Priority: High    Class: Chronic  . Lumbar degenerative disc disease 02/02/2014    Priority: Medium    Class: Chronic  . Abdominal aortic aneurysm (AAA) ([place]) 06/24/2020  . Personal history of colonic polyps 06/01/2020  . Benign neoplasm of colon 06/01/2020  . Aortic aneurysm ([place]) 06/01/2020  . 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[place]) 06/01/2020  . Arthritis of left hip 04/18/2020  . LSC (lichen simplex chronicus) 09/11/2019  . Vitamin D deficiency 12/28/2018  . Elevated hemoglobin ([place]) 10/29/2018  . Crohn's disease of ileum, unspecified complication ([place]) 25/42/7062  . ASCVD (arteriosclerotic cardiovascular disease) 10/28/2018  . Healthcare maintenance 08/21/2016  . Enterocolitis 07/14/2015  . Perforated viscus 07/14/2015  . Hyperthyroidism 12/23/2014  . Morbid obesity ([place]) 02/11/2013  . Tobacco abuse 09/17/2012  . Essential hypertension   . Hyperlipidemia    Past Medical History:  Diagnosis Date  . Crohn  disease ([place])   . Depression    History. No current problems  . DJD (degenerative joint disease) of lumbar spine   . Dyspnea    With Activity  . GERD (gastroesophageal reflux disease)    no current problems  . Heart murmur    patient denies, states she  was "diagnosed in [place], no follow up here"  . Heart murmur    no current problems  . Hyperlipidemia   . Hypertension   . Hyperthyroidism   . Lactose intolerance   . Perforated viscus 07/2015  . Rheumatoid arthritis ([place])   . STD (sexually transmitted disease)    Condyloma  . Thyroid disorder   . Vitamin D deficiency     Family History  Problem Relation Age of Onset  . Cancer Mother 45       cervical  . CVA Mother   . Hypertension Mother   . Diabetes Mother   . Obesity Mother   . High Cholesterol Mother   . Thyroid disease Daughter   . Diabetes Daughter   . Breast cancer Neg Hx     Past Surgical History:  Procedure Laterality Date  . ABDOMINAL AORTIC ENDOVASCULAR STENT GRAFT Bilateral 06/24/2020   Procedure: ABDOMINAL AORTIC ENDOVASCULAR STENT GRAFT;  Surgeon: Cherre Robins, MD;  Location: [facility];  Service: Vascular;  Laterality: Bilateral;  . ABDOMINAL HYSTERECTOMY     partial- still has cervix.  Marland Kitchen arm surgery Left   . CHOLECYSTECTOMY    . COLONOSCOPY    . EMBOLIZATION Right 06/24/2020   Procedure: COIL EMBOLIZATION RIGHT INTERNAL ILIAC ARTERY;  Surgeon: Cherre Robins, MD;  Location: [facility];  Service: Vascular;  Laterality: Right;  . LUMBAR FUSION N/A 02/02/2014   Procedure: CENTRAL LAMINECTOMY L4-5,L5-S1, TRANSFORAMINAL LUMBAR INTERBODY FUSION L4-5,L5-S1, RODS AND SCREW FIX  AND L5-S1, LOCAL BONE GRAFT;  Surgeon: Jessy Oto, MD;  Location: [facility];  Service: Orthopedics;  Laterality: N/A;  . TONSILLECTOMY AND ADENOIDECTOMY    . ULTRASOUND GUIDANCE FOR VASCULAR ACCESS Bilateral 06/24/2020   Procedure: ULTRASOUND GUIDANCE FOR VASCULAR ACCESS;  Surgeon: Cherre Robins, MD;  Location: [facility] OR;  Service: Vascular;   Laterality: Bilateral;   Social History   Occupational History  . Occupation: Retired Art therapist  Tobacco Use  . Smoking status: Current Every Day Smoker    Packs/day: 1.00    Years: 40.00    Pack years: 40.00    Types: Cigars  . Smokeless tobacco: Never Used  . Tobacco comment: 15 ciggs daily  Vaping Use  . Vaping Use: Never used  Substance and Sexual Activity  . Alcohol use: Yes    Comment: occassional -  holidays  . Drug use: No  . Sexual activity: Not Currently    Birth control/protection: Surgical    Comment: 1st intercourse 60 yo-Fewer than 5 partners

## 2020-07-21 ENCOUNTER — Ambulatory Visit: Payer: Medicare HMO | Admitting: Endocrinology

## 2020-07-21 ENCOUNTER — Other Ambulatory Visit: Payer: Self-pay

## 2020-07-21 VITALS — BP 150/80 | HR 101 | Ht 67.0 in | Wt 270.0 lb

## 2020-07-21 DIAGNOSIS — E0590 Thyrotoxicosis, unspecified without thyrotoxic crisis or storm: Secondary | ICD-10-CM

## 2020-07-21 LAB — T4, FREE: Free T4: 0.54 ng/dL — ABNORMAL LOW (ref 0.60–1.60)

## 2020-07-21 LAB — TSH: TSH: 14 u[IU]/mL — ABNORMAL HIGH (ref 0.35–4.50)
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
## 2020-07-21 NOTE — Patient Instructions (Signed)
Blood tests are requested for you today.  We'll let you know about the results.  If ever you have fever while taking methimazole, stop it and call us, even if the reason is obvious, because of the risk of a rare side-effect. It is best to never miss the medication.  However, if you do miss it, next best is to double up the next time.   Please come back for a follow-up appointment in 6 months.

## 2020-07-28 ENCOUNTER — Other Ambulatory Visit: Payer: Self-pay

## 2020-07-28 ENCOUNTER — Ambulatory Visit
Admission: RE | Admit: 2020-07-28 | Discharge: 2020-07-28 | Disposition: A | Discharge status: Home / Self Care | Payer: Medicare HMO | Source: Ambulatory Visit | Attending: Vascular Surgery | Admitting: Vascular Surgery

## 2020-07-28 DIAGNOSIS — I714 Abdominal aortic aneurysm, without rupture, unspecified: Secondary | ICD-10-CM

## 2020-07-28 DIAGNOSIS — N281 Cyst of kidney, acquired: Secondary | ICD-10-CM | POA: Diagnosis not present

## 2020-07-28 DIAGNOSIS — I723 Aneurysm of iliac artery: Secondary | ICD-10-CM | POA: Diagnosis not present

## 2020-07-28 DIAGNOSIS — I878 Other specified disorders of veins: Secondary | ICD-10-CM | POA: Diagnosis not present

## 2020-07-28 MED ORDER — IOPAMIDOL (ISOVUE-370) INJECTION 76%
75.0000 mL | Freq: Once | INTRAVENOUS | Status: AC | PRN
  Administered 2020-07-28: 75 mL via INTRAVENOUS

## 2020-08-02 ENCOUNTER — Encounter: Payer: Self-pay | Admitting: Vascular Surgery

## 2020-08-02 ENCOUNTER — Ambulatory Visit (INDEPENDENT_AMBULATORY_CARE_PROVIDER_SITE_OTHER): Payer: Medicare HMO | Admitting: Vascular Surgery

## 2020-08-02 ENCOUNTER — Other Ambulatory Visit: Payer: Self-pay

## 2020-08-02 VITALS — BP 111/72 | HR 80 | Temp 97.9°F | Resp 20 | Ht 67.0 in | Wt 270.0 lb

## 2020-08-02 DIAGNOSIS — Z9889 Other specified postprocedural states: Secondary | ICD-10-CM

## 2020-08-02 DIAGNOSIS — Z8679 Personal history of other diseases of the circulatory system: Secondary | ICD-10-CM

## 2020-08-02 NOTE — Progress Notes (Signed)
[facility] [place] PROGRESS NOTE  ASSESSMENT / PLAN: Kathy Herrera is a 60 y.o. 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 CTA shows exclusion of above aneurysms without endoleak. Ectasia of LIIA noted, and will need to be monitored with q5y CTA. Follow up in 1 year with EVAR duplex.  SUBJECTIVE: No complaints. Tolerated procedure well. No difficulty walking.  OBJECTIVE: BP 111/72 (BP Location: Left Arm, Patient Position: Sitting, Cuff Size: Large)   Pulse 80   Temp 97.9 F (36.6 C)   Resp 20   Ht 5\' 7"  (1.702 m)   Wt 270 lb (122.5 kg)   SpO2 95%   BMI 42.29 kg/m   No distress Feet warm and well perfused  CBC Latest Ref Rng & Units 06/25/2020 06/24/2020 06/22/2020  WBC 4.0 - 10.5 K/uL 12.8(H) 13.6(H) 11.7(H)  Hemoglobin 12.0 - 15.0 g/dL 12.5 13.8 15.8(H)  Hematocrit 36.0 - 46.0 % 40.8 43.8 49.8(H)  Platelets 150 - 400 K/uL 160 176 216     CMP Latest Ref Rng & Units 06/25/2020 06/24/2020 06/22/2020  Glucose 70 - 99 mg/dL 116(H) 114(H) 95  BUN 8 - 23 mg/dL 13 12 17   Creatinine 0.44 - 1.00 mg/dL 1.14(H) 1.07(H) 1.11(H)  Sodium 135 - 145 mmol/L 136 137 138  Potassium 3.5 - 5.1 mmol/L 3.3(L) 3.1(L) 3.7  Chloride 98 - 111 mmol/L 100 100 99  CO2 22 - 32 mmol/L 32 31 28  Calcium 8.9 - 10.3 mg/dL 8.3(L) 8.8(L) 10.2  Total Protein 6.5 - 8.1 g/dL - - 7.4  Total Bilirubin 0.3 - 1.2 mg/dL - - 0.5  Alkaline Phos 38 - 126 U/L - - 56  AST 15 - 41 U/L - - 21  ALT 0 - 44 U/L - - 17    CrCl cannot be calculated (Patient's most recent lab result is older than the maximum 21 days allowed.).  CTA personally reviewed. Well positioned endograft with exclusion of AAA, RCIAA, RIIAA. No evidence of endoleak. Stable aneurysm sac.   Yevonne Aline. Stanford Breed, MD [facility] Phone Number: [phone] 08/02/2020 4:43 PM

## 2020-08-18 ENCOUNTER — Ambulatory Visit: Payer: Medicare HMO | Admitting: Specialist

## 2020-08-23 ENCOUNTER — Other Ambulatory Visit: Payer: Self-pay | Admitting: Family Medicine

## 2020-08-23 DIAGNOSIS — I2510 Atherosclerotic heart disease of native coronary artery without angina pectoris: Secondary | ICD-10-CM

## 2020-08-23 DIAGNOSIS — E785 Hyperlipidemia, unspecified: Secondary | ICD-10-CM

## 2020-09-02 ENCOUNTER — Other Ambulatory Visit: Payer: Self-pay

## 2020-09-02 ENCOUNTER — Encounter: Payer: Self-pay | Admitting: Physical Medicine and Rehabilitation

## 2020-09-02 ENCOUNTER — Ambulatory Visit (INDEPENDENT_AMBULATORY_CARE_PROVIDER_SITE_OTHER): Payer: Medicare HMO | Admitting: Physical Medicine and Rehabilitation

## 2020-09-02 DIAGNOSIS — I10 Essential (primary) hypertension: Secondary | ICD-10-CM

## 2020-09-02 DIAGNOSIS — R202 Paresthesia of skin: Secondary | ICD-10-CM | POA: Diagnosis not present

## 2020-09-02 MED ORDER — LISINOPRIL 40 MG PO TABS: 40.0000 mg | ORAL_TABLET | Freq: Every day | ORAL | 0 refills | Status: DC

## 2020-09-02 NOTE — Progress Notes (Signed)
Tingling and numbness in both hands- right is worse. Certain motions with hands- washing dishes, turning pages of a book- cause symptoms to increase. Right hand dominant No lotion per patient Numeric Pain Rating Scale and Functional Assessment Average Pain 8   In the last MONTH (on 0-10 scale) has pain interfered with the following?  1. General activity like being  able to carry out your everyday physical activities such as walking, climbing stairs, carrying groceries, or moving a chair?  Rating(4)

## 2020-09-09 ENCOUNTER — Ambulatory Visit: Payer: Medicare HMO | Admitting: Specialist

## 2020-09-12 ENCOUNTER — Other Ambulatory Visit: Payer: Self-pay

## 2020-09-12 ENCOUNTER — Encounter: Payer: Self-pay | Admitting: Specialist

## 2020-09-12 ENCOUNTER — Ambulatory Visit: Payer: Medicare HMO | Admitting: Specialist

## 2020-09-12 VITALS — BP 159/96 | HR 86 | Ht 67.0 in | Wt 270.0 lb

## 2020-09-12 DIAGNOSIS — M4807 Spinal stenosis, lumbosacral region: Secondary | ICD-10-CM

## 2020-09-12 DIAGNOSIS — M4316 Spondylolisthesis, lumbar region: Secondary | ICD-10-CM

## 2020-09-12 DIAGNOSIS — G5603 Carpal tunnel syndrome, bilateral upper limbs: Secondary | ICD-10-CM | POA: Diagnosis not present

## 2020-09-12 DIAGNOSIS — M25552 Pain in left hip: Secondary | ICD-10-CM | POA: Diagnosis not present

## 2020-09-12 DIAGNOSIS — M5136 Other intervertebral disc degeneration, lumbar region: Secondary | ICD-10-CM

## 2020-09-12 DIAGNOSIS — Z981 Arthrodesis status: Secondary | ICD-10-CM | POA: Diagnosis not present

## 2020-09-12 DIAGNOSIS — M1612 Unilateral primary osteoarthritis, left hip: Secondary | ICD-10-CM | POA: Diagnosis not present

## 2020-09-12 MED ORDER — DICLOFENAC SODIUM 1 % EX GEL: 4.0000 g | Freq: Four times a day (QID) | CUTANEOUS | 5 refills | Status: DC

## 2020-09-12 NOTE — Patient Instructions (Addendum)
Plan: Knee is suffering from osteoarthritis, only real proven treatments are Weight loss, NSIADs like diclofenac and exercise. Well padded shoes help. Ice the knee that s suffering from osteoarthritis, only real proven treatments are Weight loss, NSIADs like diclofenac and exercise. Well padded shoes help. Ice the knee 2-3 times a day 15-20 mins at a time.-3 times a day 15-20 mins at a time. Hot showers in the AM.  Injection with steroid may be of benefit. Hemp CBD capsules, [email or web address] 5,000-7,000 mg per bottle, 60 capsules per bottle, take one capsule twice a day. Cane in the left hand to use with left leg weight bearing. Follow-Up Instructions: No follow-ups on file.   Carpal Tunnel Syndrome  Carpal tunnel syndrome is a condition that causes pain in your hand and arm. The carpal tunnel is a narrow area located on the palm side of your wrist. Repeated wrist motion or certain diseases may cause swelling within the tunnel. This swelling pinches the main nerve in the wrist (median nerve). What are the causes? This condition may be caused by: Repeated wrist motions. Wrist injuries. Arthritis. A cyst or tumor in the carpal tunnel. Fluid buildup during pregnancy. Sometimes the cause of this condition is not known. What increases the risk? This condition is more likely to develop in: People who have jobs that cause them to repeatedly move their wrists in the same motion, such as Art gallery manager. Women. People with certain conditions, such as: Diabetes. Obesity. An underactive thyroid (hypothyroidism). Kidney failure. What are the signs or symptoms? Symptoms of this condition include: A tingling feeling in your fingers, especially in your thumb, index, and middle fingers. Tingling or numbness in your hand. An aching feeling in your entire arm, especially when your wrist and elbow are bent for long periods of time. Wrist pain that goes up your arm to your shoulder. Pain that goes  down into your palm or fingers. A weak feeling in your hands. You may have trouble grabbing and holding items. Your symptoms may feel worse during the night. How is this diagnosed? This condition is diagnosed with a medical history and physical exam. You may also have tests, including: An electromyogram (EMG). This test measures electrical signals sent by your nerves into the muscles. X-rays. How is this treated? Treatment for this condition includes: Lifestyle changes. It is important to stop doing or modify the activity that caused your condition. Physical or occupational therapy. Medicines for pain and inflammation. This may include medicine that is injected into your wrist. A wrist splint. Surgery. Follow these instructions at home: If you have a splint:  Wear it as told by your health care provider. Remove it only as told by your health care provider. Loosen the splint if your fingers become numb and tingle, or if they turn cold and blue. Keep the splint clean and dry. General instructions  Take over-the-counter and prescription medicines only as told by your health care provider. Rest your wrist from any activity that may be causing your pain. If your condition is work related, talk to your employer about changes that can be made, such as getting a wrist pad to use while typing. If directed, apply ice to the painful area: Put ice in a plastic bag. Place a towel between your skin and the bag. Leave the ice on for 20 minutes, 2-3 times per day. Keep all follow-up visits as told by your health care provider. This is important. Do any exercises as told by your health care  provider, physical therapist, or occupational therapist. Contact a health care provider if: You have new symptoms. Your pain is not controlled with medicines. Your symptoms get worse. This information is not intended to replace advice given to you by your health care provider. Make sure you discuss any questions  you have with your health care provider. Document Released: 02/17/2000 Document Revised: 06/30/2015 Document Reviewed: 10/31/2016 Elsevier Interactive Patient Education  2017 [employer].

## 2020-09-12 NOTE — Procedures (Signed)
EMG & NCV Findings: Evaluation of the left median motor nerve showed decreased conduction velocity (Elbow-Wrist, 48 m/s).  The left median (across palm) sensory and the right median (across palm) sensory nerves showed prolonged distal peak latency (Wrist, L5.0, R5.4 ms) and prolonged distal peak latency (Palm, L2.7, R2.3 ms).  All remaining nerves (as indicated in the following tables) were within normal limits.  All left vs. right side differences were within normal limits.    All examined muscles (as indicated in the following table) showed no evidence of electrical instability.    Impression: The above electrodiagnostic study is ABNORMAL and reveals evidence of a mild bilateral median nerve entrapment at the wrist (carpal tunnel syndrome) affecting sensory components. There is no significant electrodiagnostic evidence of any other focal nerve entrapment, brachial plexopathy or cervical radiculopathy.   Recommendations: 1.  Follow-up with referring physician. 2.  Continue current management of symptoms.  ___________________________ Laurence Spates FAAPMR Board Certified, American Board of Physical Medicine and Rehabilitation    Nerve Conduction Studies Anti Sensory Summary Table   Stim Site NR Peak (ms) Norm Peak (ms) P-T Amp (V) Norm P-T Amp Site1 Site2 Delta-P (ms) Dist (cm) Vel (m/s) Norm Vel (m/s)  Left Median Acr Palm Anti Sensory (2nd Digit)  30C  Wrist    *5.0 <3.6 21.5 >10 Wrist Palm 2.3 0.0    Palm    *2.7 <2.0 2.1         Right Median Acr Palm Anti Sensory (2nd Digit)  30C  Wrist    *5.4 <3.6 16.7 >10 Wrist Palm 3.1 0.0    Palm    *2.3 <2.0 3.4         Right Radial Anti Sensory (Base 1st Digit)  29.6C  Wrist    2.4 <3.1 16.9  Wrist Base 1st Digit 2.4 0.0    Right Ulnar Anti Sensory (5th Digit)  30.1C  Wrist    3.5 <3.7 21.7 >15.0 Wrist 5th Digit 3.5 14.0 40 >38   Motor Summary Table   Stim Site NR Onset (ms) Norm Onset (ms) O-P Amp (mV) Norm O-P Amp Site1 Site2 Delta-0  (ms) Dist (cm) Vel (m/s) Norm Vel (m/s)  Left Median Motor (Abd Poll Brev)  30.4C  Wrist    4.0 <4.2 7.7 >5 Elbow Wrist 5.2 25.0 *48 >50  Elbow    9.2  6.8         Right Median Motor (Abd Poll Brev)  30C  Wrist    4.0 <4.2 7.8 >5 Elbow Wrist 4.5 23.5 52 >50  Elbow    8.5  7.5         Right Ulnar Motor (Abd Dig Min)  30.3C  Wrist    3.4 <4.2 10.0 >3 B Elbow Wrist 3.4 21.0 62 >53  B Elbow    6.8  9.3  A Elbow B Elbow 1.3 10.0 77 >53  A Elbow    8.1  9.2          EMG   Side Muscle Nerve Root Ins Act Fibs Psw Amp Dur Poly Recrt Int Fraser Din Comment  Right Abd Poll Brev Median C8-T1 Nml Nml Nml Nml Nml 0 Nml Nml   Right 1stDorInt Ulnar C8-T1 Nml Nml Nml Nml Nml 0 Nml Nml   Right PronatorTeres Median C6-7 Nml Nml Nml Nml Nml 0 Nml Nml   Right Biceps Musculocut C5-6 Nml Nml Nml Nml Nml 0 Nml Nml   Right Deltoid Axillary C5-6 Nml Nml Nml Nml Nml 0  Nml Nml     Nerve Conduction Studies Anti Sensory Left/Right Comparison   Stim Site L Lat (ms) R Lat (ms) L-R Lat (ms) L Amp (V) R Amp (V) L-R Amp (%) Site1 Site2 L Vel (m/s) R Vel (m/s) L-R Vel (m/s)  Median Acr Palm Anti Sensory (2nd Digit)  30C  Wrist *5.0 *5.4 0.4 21.5 16.7 22.3 Wrist Palm     Palm *2.7 *2.3 0.4 2.1 3.4 38.2       Radial Anti Sensory (Base 1st Digit)  29.6C  Wrist  2.4   16.9  Wrist Base 1st Digit     Ulnar Anti Sensory (5th Digit)  30.1C  Wrist  3.5   21.7  Wrist 5th Digit  40    Motor Left/Right Comparison   Stim Site L Lat (ms) R Lat (ms) L-R Lat (ms) L Amp (mV) R Amp (mV) L-R Amp (%) Site1 Site2 L Vel (m/s) R Vel (m/s) L-R Vel (m/s)  Median Motor (Abd Poll Brev)  30.4C  Wrist 4.0 4.0 0.0 7.7 7.8 1.3 Elbow Wrist *48 52 4  Elbow 9.2 8.5 0.7 6.8 7.5 9.3       Ulnar Motor (Abd Dig Min)  30.3C  Wrist  3.4   10.0  B Elbow Wrist  62   B Elbow  6.8   9.3  A Elbow B Elbow  77   A Elbow  8.1   9.2           Waveforms:

## 2020-09-12 NOTE — Progress Notes (Signed)
Office Visit Note   Patient: Kathy Herrera           Date of Birth: 1960/12/26           MRN: [id number] Visit Date: 09/12/2020              Requested by: Libby Maw, MD [address] [place],  [place] [address] PCP: Libby Maw, MD   Assessment & Plan: Visit Diagnoses:  1. Lumbar degenerative disc disease   2. Spondylolisthesis of lumbar region   3. Spinal stenosis of lumbosacral region   4. History of lumbar spinal fusion   5. Pain in left hip   6. Unilateral primary osteoarthritis, left hip   7. Carpal tunnel syndrome, bilateral     Plan: Plan: Knee is suffering from osteoarthritis, only real proven treatments are Weight loss, NSIADs like diclofenac geland exercise. Well padded shoes help. Ice the knee that s suffering from osteoarthritis, only real proven treatments are Weight loss, NSIADs like diclofenac gel and exercise. Well padded shoes help. Ice the knee 2-3 times a day 15-20 mins at a time.-3 times a day 15-20 mins at a time. Hot showers in the AM.  Injection with steroid may be of benefit. Hemp CBD capsules, [email or web address] 5,000-7,000 mg per bottle, 60 capsules per bottle, take one capsule twice a day. Cane in the left hand to use with left leg weight bearing. Follow-Up Instructions: No follow-ups on file.    Carpal Tunnel Syndrome  Carpal tunnel syndrome is a condition that causes pain in your hand and arm. The carpal tunnel is a narrow area located on the palm side of your wrist. Repeated wrist motion or certain diseases may cause swelling within the tunnel. This swelling pinches the main nerve in the wrist (median nerve). What are the causes? This condition may be caused by: Repeated wrist motions. Wrist injuries. Arthritis. A cyst or tumor in the carpal tunnel. Fluid buildup during pregnancy. Sometimes the cause of this condition is not known. What increases the risk? This condition is more likely to develop in: People who  have jobs that cause them to repeatedly move their wrists in the same motion, such as Art gallery manager. Women. People with certain conditions, such as: Diabetes. Obesity. An underactive thyroid (hypothyroidism). Kidney failure. What are the signs or symptoms? Symptoms of this condition include: A tingling feeling in your fingers, especially in your thumb, index, and middle fingers. Tingling or numbness in your hand. An aching feeling in your entire arm, especially when your wrist and elbow are bent for long periods of time. Wrist pain that goes up your arm to your shoulder. Pain that goes down into your palm or fingers. A weak feeling in your hands. You may have trouble grabbing and holding items. Your symptoms may feel worse during the night. How is this diagnosed? This condition is diagnosed with a medical history and physical exam. You may also have tests, including: An electromyogram (EMG). This test measures electrical signals sent by your nerves into the muscles. X-rays. How is this treated? Treatment for this condition includes: Lifestyle changes. It is important to stop doing or modify the activity that caused your condition. Physical or occupational therapy. Medicines for pain and inflammation. This may include medicine that is injected into your wrist. A wrist splint. Surgery. Follow these instructions at home: If you have a splint:  Wear it as told by your health care provider. Remove it only as told by your health  care provider. Loosen the splint if your fingers become numb and tingle, or if they turn cold and blue. Keep the splint clean and dry. General instructions  Take over-the-counter and prescription medicines only as told by your health care provider. Rest your wrist from any activity that may be causing your pain. If your condition is work related, talk to your employer about changes that can be made, such as getting a wrist pad to use while typing. If  directed, apply ice to the painful area: Put ice in a plastic bag. Place a towel between your skin and the bag. Leave the ice on for 20 minutes, 2-3 times per day. Keep all follow-up visits as told by your health care provider. This is important. Do any exercises as told by your health care provider, physical therapist, or occupational therapist. Contact a health care provider if: You have new symptoms. Your pain is not controlled with medicines. Your symptoms get worse. This information is not intended to replace advice given to you by your health care provider. Make sure you discuss any questions you have with your health care provider. Document Released: 02/17/2000 Document Revised: 06/30/2015 Document Reviewed: 10/31/2016 Elsevier Interactive Patient Education  2017 [employer] Instructions: Return in about 4 months (around 01/13/2021).   Orders:  No orders of the defined types were placed in this encounter.  No orders of the defined types were placed in this encounter.     Procedures: No procedures performed   Clinical Data: No additional findings.   Subjective: Chief Complaint  Patient presents with   Right Hand - Follow-up    EMG/NCS Review   Left Hand - Follow-up    EMG/NCS Review    HPI  Review of Systems   Objective: Vital Signs: BP (!) 159/96 (BP Location: Left Arm, Patient Position: Sitting)   Pulse 86   Ht 5\' 7"  (1.702 m)   Wt 270 lb (122.5 kg)   BMI 42.29 kg/m   Physical Exam  Ortho Exam  Specialty Comments:  No specialty comments available.  Imaging: No results found.   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[place]) 06/01/2020   Arthritis of left hip 44/03/270   LSC (lichen simplex chronicus) 09/11/2019   Vitamin D deficiency 12/28/2018   Elevated hemoglobin ([place]) 10/29/2018   Crohn's disease of ileum, unspecified complication ([place]) 53/66/4403   ASCVD (arteriosclerotic cardiovascular disease) 10/28/2018   Healthcare maintenance 08/21/2016   Enterocolitis 07/14/2015   Perforated viscus 07/14/2015   Hyperthyroidism 12/23/2014   Morbid obesity ([place]) 02/11/2013   Tobacco abuse 09/17/2012   Essential hypertension    Hyperlipidemia    Past Medical History:  Diagnosis Date   AAA (abdominal aortic aneurysm) (HCC)    Crohn disease ([place])    Depression    History. No current problems   DJD (degenerative joint disease) of lumbar spine    Dyspnea    With Activity  GERD (gastroesophageal reflux disease)    no current problems   Heart murmur    patient denies, states she was "diagnosed in [place], no follow up here"   Heart murmur    no current problems   Hyperlipidemia    Hypertension    Hyperthyroidism    Lactose intolerance    Perforated viscus 07/2015   Rheumatoid arthritis ([place])    STD (sexually transmitted disease)    Condyloma   Thyroid disorder    Vitamin D deficiency     Family History  Problem Relation Age of Onset   Cancer Mother 44       cervical   CVA Mother    Hypertension Mother    Diabetes Mother    Obesity Mother    High Cholesterol Mother    Thyroid disease Daughter    Diabetes Daughter    Breast cancer Neg Hx     Past Surgical History:  Procedure Laterality Date   ABDOMINAL AORTIC ENDOVASCULAR STENT GRAFT Bilateral 06/24/2020   Procedure: ABDOMINAL AORTIC ENDOVASCULAR STENT GRAFT;  Surgeon: Cherre Robins, MD;  Location: [facility] OR;  Service:  Vascular;  Laterality: Bilateral;   ABDOMINAL HYSTERECTOMY     partial- still has cervix.   arm surgery Left    CHOLECYSTECTOMY     COLONOSCOPY     EMBOLIZATION Right 06/24/2020   Procedure: COIL EMBOLIZATION RIGHT INTERNAL ILIAC ARTERY;  Surgeon: Cherre Robins, MD;  Location: [facility];  Service: Vascular;  Laterality: Right;   LUMBAR FUSION N/A 02/02/2014   Procedure: CENTRAL LAMINECTOMY L4-5,L5-S1, TRANSFORAMINAL LUMBAR INTERBODY FUSION L4-5,L5-S1, RODS AND SCREW FIX  AND L5-S1, LOCAL BONE GRAFT;  Surgeon: Jessy Oto, MD;  Location: [facility];  Service: Orthopedics;  Laterality: N/A;   TONSILLECTOMY AND ADENOIDECTOMY     ULTRASOUND GUIDANCE FOR VASCULAR ACCESS Bilateral 06/24/2020   Procedure: ULTRASOUND GUIDANCE FOR VASCULAR ACCESS;  Surgeon: Cherre Robins, MD;  Location: [facility] OR;  Service: Vascular;  Laterality: Bilateral;   Social History   Occupational History   Occupation: Retired Art therapist  Tobacco Use   Smoking status: Every Day    Packs/day: 1.00    Years: 40.00    Pack years: 40.00    Types: Cigars, Cigarettes   Smokeless tobacco: Never   Tobacco comments:    15 ciggs daily  Vaping Use   Vaping Use: Never used  Substance and Sexual Activity   Alcohol use: Yes    Comment: occassional -  holidays   Drug use: No   Sexual activity: Not Currently    Birth control/protection: Surgical    Comment: 1st intercourse 60 yo-Fewer than 5 partners

## 2020-09-12 NOTE — Progress Notes (Signed)
[name] - 60 y.o. female MRN [id number]  Date of birth: 15-Nov-1960  Office Visit Note: Visit Date: 09/02/2020 PCP: Libby Maw, MD Referred by: Libby Maw,*  Subjective: Chief Complaint  Patient presents with   Right Hand - Numbness   Left Hand - Numbness   HPI:  Kathy Herrera is a 60 y.o. female who comes in today at the request of Dr. Basil Dess for electrodiagnostic study of the Bilateral upper extremities.  Patient is Right hand dominant.  She reports 8 out of 10 pain with tingling and numbness in both hands right more than left.  This is been a chronic ongoing situation that seems to be worse after procedure for infrarenal aneurysm.  She reports certain motions with the hands including washing dishes and turning pages in a book seem to cause an increase in pain.  She does get some nocturnal complaints.  No specific radicular symptoms.  She does have lumbar spine issues and history of Crohn's disease but no diabetes.  No prior electrodiagnostic study of the hands.   ROS Otherwise per HPI.  Assessment & Plan: Visit Diagnoses:    ICD-10-CM   1. Paresthesia of skin  R20.2 NCV with EMG (electromyography)      Plan:   Meds & Orders: No orders of the defined types were placed in this encounter.   Orders Placed This Encounter  Procedures   NCV with EMG (electromyography)    Follow-up: Return for Basil Dess, MD as scheduled.   Procedures: No procedures performedImpression: The above electrodiagnostic study is ABNORMAL and reveals evidence of a mild bilateral median nerve entrapment at the wrist (carpal tunnel syndrome) affecting sensory components. There is no significant electrodiagnostic evidence of any other focal nerve entrapment, brachial plexopathy or cervical radiculopathy.   Recommendations: 1.  Follow-up with referring physician. 2.  Continue current management of symptoms.  EMG & NCV Findings: Evaluation of the left median motor  nerve showed decreased conduction velocity (Elbow-Wrist, 48 m/s).  The left median (across palm) sensory and the right median (across palm) sensory nerves showed prolonged distal peak latency (Wrist, L5.0, R5.4 ms) and prolonged distal peak latency (Palm, L2.7, R2.3 ms).  All remaining nerves (as indicated in the following tables) were within normal limits.  All left vs. right side differences were within normal limits.    All examined muscles (as indicated in the following table) showed no evidence of electrical instability.    Impression: The above electrodiagnostic study is ABNORMAL and reveals evidence of a mild bilateral median nerve entrapment at the wrist (carpal tunnel syndrome) affecting sensory components. There is no significant electrodiagnostic evidence of any other focal nerve entrapment, brachial plexopathy or cervical radiculopathy.   Recommendations: 1.  Follow-up with referring physician. 2.  Continue current management of symptoms.  ___________________________ Laurence Spates FAAPMR Board Certified, American Board of Physical Medicine and Rehabilitation    Nerve Conduction Studies Anti Sensory Summary Table   Stim Site NR Peak (ms) Norm Peak (ms) P-T Amp (V) Norm P-T Amp Site1 Site2 Delta-P (ms) Dist (cm) Vel (m/s) Norm Vel (m/s)  Left Median Acr Palm Anti Sensory (2nd Digit)  30C  Wrist    *5.0 <3.6 21.5 >10 Wrist Palm 2.3 0.0    Palm    *2.7 <2.0 2.1         Right Median Acr Palm Anti Sensory (2nd Digit)  30C  Wrist    *5.4 <3.6 16.7 >10 Wrist Palm 3.1 0.0  Palm    *2.3 <2.0 3.4         Right Radial Anti Sensory (Base 1st Digit)  29.6C  Wrist    2.4 <3.1 16.9  Wrist Base 1st Digit 2.4 0.0    Right Ulnar Anti Sensory (5th Digit)  30.1C  Wrist    3.5 <3.7 21.7 >15.0 Wrist 5th Digit 3.5 14.0 40 >38   Motor Summary Table   Stim Site NR Onset (ms) Norm Onset (ms) O-P Amp (mV) Norm O-P Amp Site1 Site2 Delta-0 (ms) Dist (cm) Vel (m/s) Norm Vel (m/s)  Left Median  Motor (Abd Poll Brev)  30.4C  Wrist    4.0 <4.2 7.7 >5 Elbow Wrist 5.2 25.0 *48 >50  Elbow    9.2  6.8         Right Median Motor (Abd Poll Brev)  30C  Wrist    4.0 <4.2 7.8 >5 Elbow Wrist 4.5 23.5 52 >50  Elbow    8.5  7.5         Right Ulnar Motor (Abd Dig Min)  30.3C  Wrist    3.4 <4.2 10.0 >3 B Elbow Wrist 3.4 21.0 62 >53  B Elbow    6.8  9.3  A Elbow B Elbow 1.3 10.0 77 >53  A Elbow    8.1  9.2          EMG   Side Muscle Nerve Root Ins Act Fibs Psw Amp Dur Poly Recrt Int Fraser Din Comment  Right Abd Poll Brev Median C8-T1 Nml Nml Nml Nml Nml 0 Nml Nml   Right 1stDorInt Ulnar C8-T1 Nml Nml Nml Nml Nml 0 Nml Nml   Right PronatorTeres Median C6-7 Nml Nml Nml Nml Nml 0 Nml Nml   Right Biceps Musculocut C5-6 Nml Nml Nml Nml Nml 0 Nml Nml   Right Deltoid Axillary C5-6 Nml Nml Nml Nml Nml 0 Nml Nml     Nerve Conduction Studies Anti Sensory Left/Right Comparison   Stim Site L Lat (ms) R Lat (ms) L-R Lat (ms) L Amp (V) R Amp (V) L-R Amp (%) Site1 Site2 L Vel (m/s) R Vel (m/s) L-R Vel (m/s)  Median Acr Palm Anti Sensory (2nd Digit)  30C  Wrist *5.0 *5.4 0.4 21.5 16.7 22.3 Wrist Palm     Palm *2.7 *2.3 0.4 2.1 3.4 38.2       Radial Anti Sensory (Base 1st Digit)  29.6C  Wrist  2.4   16.9  Wrist Base 1st Digit     Ulnar Anti Sensory (5th Digit)  30.1C  Wrist  3.5   21.7  Wrist 5th Digit  40    Motor Left/Right Comparison   Stim Site L Lat (ms) R Lat (ms) L-R Lat (ms) L Amp (mV) R Amp (mV) L-R Amp (%) Site1 Site2 L Vel (m/s) R Vel (m/s) L-R Vel (m/s)  Median Motor (Abd Poll Brev)  30.4C  Wrist 4.0 4.0 0.0 7.7 7.8 1.3 Elbow Wrist *48 52 4  Elbow 9.2 8.5 0.7 6.8 7.5 9.3       Ulnar Motor (Abd Dig Min)  30.3C  Wrist  3.4   10.0  B Elbow Wrist  62   B Elbow  6.8   9.3  A Elbow B Elbow  77   A Elbow  8.1   9.2           Waveforms:                Clinical History: No  specialty comments available.     Objective:  VS:  HT:    WT:   BMI:     BP:   HR: bpm  TEMP: ( )   RESP:  Physical Exam Musculoskeletal:        General: No swelling, tenderness or deformity.     Comments: Inspection reveals no atrophy of the bilateral APB or FDI or hand intrinsics. There is no swelling, color changes, allodynia or dystrophic changes. There is 5 out of 5 strength in the bilateral wrist extension, finger abduction and long finger flexion. There is intact sensation to light touch in all dermatomal and peripheral nerve distributions.  There is a negative Hoffmann's test bilaterally.  Skin:    General: Skin is warm and dry.     Findings: No erythema or rash.  Neurological:     General: No focal deficit present.     Mental Status: She is alert and oriented to person, place, and time.     Motor: No weakness or abnormal muscle tone.     Coordination: Coordination normal.  Psychiatric:        Mood and Affect: Mood normal.        Behavior: Behavior normal.     Imaging: No results found.

## 2020-09-22 ENCOUNTER — Encounter: Payer: Self-pay | Admitting: Physical Medicine and Rehabilitation

## 2020-09-22 ENCOUNTER — Other Ambulatory Visit: Payer: Self-pay

## 2020-09-22 ENCOUNTER — Ambulatory Visit: Payer: Medicare HMO | Admitting: Physical Medicine and Rehabilitation

## 2020-09-22 ENCOUNTER — Ambulatory Visit: Payer: Self-pay

## 2020-09-22 DIAGNOSIS — M25552 Pain in left hip: Secondary | ICD-10-CM | POA: Diagnosis not present

## 2020-09-22 MED ORDER — BUPIVACAINE HCL 0.25 % IJ SOLN
4.0000 mL | INTRAMUSCULAR | Status: AC | PRN
  Administered 2020-09-22: 4 mL via INTRA_ARTICULAR

## 2020-09-22 MED ORDER — TRIAMCINOLONE ACETONIDE 40 MG/ML IJ SUSP
60.0000 mg | INTRAMUSCULAR | Status: AC | PRN
  Administered 2020-09-22: 60 mg via INTRA_ARTICULAR

## 2020-09-22 NOTE — Progress Notes (Signed)
   [name] - 60 y.o. female MRN [id number]  Date of birth: Jan 11, 1961  Office Visit Note: Visit Date: 09/22/2020 PCP: Libby Maw, MD Referred by: Libby Maw,*  Subjective: Chief Complaint  Patient presents with   Left Hip - Pain   HPI:  Kathy Herrera is a 60 y.o. female who comes in today at the request of Dr. Basil Dess for planned Left anesthetic hip arthrogram with fluoroscopic guidance.  The patient has failed conservative care including home exercise, medications, time and activity modification.  This injection will be diagnostic and hopefully therapeutic.  Please see requesting physician notes for further details and justification.   ROS Otherwise per HPI.  Assessment & Plan: Visit Diagnoses:    ICD-10-CM   1. Pain in left hip  M25.552 XR C-ARM NO REPORT      Plan: No additional findings.   Meds & Orders: No orders of the defined types were placed in this encounter.   Orders Placed This Encounter  Procedures   Large Joint Inj   XR C-ARM NO REPORT    Follow-up: Return if symptoms worsen or fail to improve.   Procedures: Large Joint Inj: L hip joint on 09/22/2020 11:30 AM Indications: diagnostic evaluation and pain Details: 22 G 3.5 in needle, fluoroscopy-guided anterior approach  Arthrogram: No  Medications: 4 mL bupivacaine 0.25 %; 60 mg triamcinolone acetonide 40 MG/ML Outcome: tolerated well, no immediate complications  There was excellent flow of contrast producing a partial arthrogram of the hip. The patient did have significant relief of symptoms during the anesthetic phase of the injection. Procedure, treatment alternatives, risks and benefits explained, specific risks discussed. Consent was given by the patient. Immediately prior to procedure a time out was called to verify the correct patient, procedure, equipment, support staff and site/side marked as required. Patient was prepped and draped in the usual sterile fashion.          Clinical History: No specialty comments available.     Objective:  VS:  HT:    WT:   BMI:     BP:   HR: bpm  TEMP: ( )  RESP:  Physical Exam   Imaging: XR C-ARM NO REPORT  Result Date: 09/22/2020 Please see Notes tab for imaging impression.

## 2020-09-22 NOTE — Progress Notes (Signed)
Pt state left hip pain. Pt state some movement of her left leg make the pain worse. Pt when sitting in a car and getting out of bed makes the pain worse. Pt state she take over the counter pain meds and uses pain cream to help ease her pain. Pt has hx of inj on 04/28/20 pt state it helped.  Numeric Pain Rating Scale and Functional Assessment Average Pain 8   In the last MONTH (on 0-10 scale) has pain interfered with the following?  1. General activity like being  able to carry out your everyday physical activities such as walking, climbing stairs, carrying groceries, or moving a chair?  Rating(8)    -BT, -Dye Allergies.

## 2020-09-27 ENCOUNTER — Other Ambulatory Visit: Payer: Self-pay

## 2020-09-27 DIAGNOSIS — I2510 Atherosclerotic heart disease of native coronary artery without angina pectoris: Secondary | ICD-10-CM

## 2020-09-27 DIAGNOSIS — I10 Essential (primary) hypertension: Secondary | ICD-10-CM

## 2020-09-27 MED ORDER — ATENOLOL 100 MG PO TABS: 100.0000 mg | ORAL_TABLET | Freq: Every day | ORAL | 0 refills | Status: DC

## 2020-09-29 ENCOUNTER — Telehealth: Payer: Self-pay | Admitting: *Deleted

## 2020-09-29 ENCOUNTER — Other Ambulatory Visit: Payer: Self-pay

## 2020-09-29 NOTE — Chronic Care Management (AMB) (Signed)
  Chronic Care Management   Note  09/29/2020 Name: Kathy Herrera [facility] MRN: [id number] DOB: 1960-05-06  Kathy Herrera is a 60 y.o. year old female who is a primary care patient of Libby Maw, MD. I reached out to [facility] by phone today in response to a referral sent by Kathy Herrera's PCP Libby Maw, MD     Kathy Herrera was given information about Chronic Care Management services today including:  CCM service includes personalized support from designated clinical staff supervised by her physician, including individualized plan of care and coordination with other care providers 24/7 contact phone numbers for assistance for urgent and routine care needs. Service will only be billed when office clinical staff spend 20 minutes or more in a month to coordinate care. Only one practitioner may furnish and bill the service in a calendar month. The patient may stop CCM services at any time (effective at the end of the month) by phone call to the office staff. The patient will be responsible for cost sharing (co-pay) of up to 20% of the service fee (after annual deductible is met).  Patient agreed to services and verbal consent obtained.   Follow up plan: Telephone appointment with care management team member scheduled for: 10/19/2020  Julian Hy, [place] Management  Direct Dial: [phone]

## 2020-09-30 ENCOUNTER — Encounter: Payer: Self-pay | Admitting: Family Medicine

## 2020-09-30 ENCOUNTER — Ambulatory Visit (INDEPENDENT_AMBULATORY_CARE_PROVIDER_SITE_OTHER): Payer: Medicare HMO | Admitting: Family Medicine

## 2020-09-30 VITALS — BP 122/70 | HR 68 | Temp 99.1°F | Ht 67.0 in | Wt 273.6 lb

## 2020-09-30 DIAGNOSIS — Z0000 Encounter for general adult medical examination without abnormal findings: Secondary | ICD-10-CM

## 2020-09-30 DIAGNOSIS — E559 Vitamin D deficiency, unspecified: Secondary | ICD-10-CM

## 2020-09-30 DIAGNOSIS — E7849 Other hyperlipidemia: Secondary | ICD-10-CM

## 2020-09-30 DIAGNOSIS — D582 Other hemoglobinopathies: Secondary | ICD-10-CM | POA: Diagnosis not present

## 2020-09-30 DIAGNOSIS — Z23 Encounter for immunization: Secondary | ICD-10-CM | POA: Diagnosis not present

## 2020-09-30 DIAGNOSIS — I10 Essential (primary) hypertension: Secondary | ICD-10-CM

## 2020-09-30 DIAGNOSIS — Z716 Tobacco abuse counseling: Secondary | ICD-10-CM
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
## 2020-10-03 ENCOUNTER — Other Ambulatory Visit: Payer: Self-pay

## 2020-10-03 ENCOUNTER — Other Ambulatory Visit: Payer: Medicare HMO

## 2020-10-03 DIAGNOSIS — I10 Essential (primary) hypertension: Secondary | ICD-10-CM

## 2020-10-03 DIAGNOSIS — D582 Other hemoglobinopathies: Secondary | ICD-10-CM

## 2020-10-03 DIAGNOSIS — E559 Vitamin D deficiency, unspecified: Secondary | ICD-10-CM

## 2020-10-03 DIAGNOSIS — E7849 Other hyperlipidemia: Secondary | ICD-10-CM

## 2020-10-04 ENCOUNTER — Other Ambulatory Visit (INDEPENDENT_AMBULATORY_CARE_PROVIDER_SITE_OTHER): Payer: Medicare HMO

## 2020-10-04 DIAGNOSIS — E7849 Other hyperlipidemia: Secondary | ICD-10-CM

## 2020-10-04 DIAGNOSIS — I10 Essential (primary) hypertension: Secondary | ICD-10-CM

## 2020-10-04 DIAGNOSIS — E559 Vitamin D deficiency, unspecified: Secondary | ICD-10-CM

## 2020-10-04 DIAGNOSIS — D582 Other hemoglobinopathies: Secondary | ICD-10-CM

## 2020-10-04 LAB — URINALYSIS, ROUTINE W REFLEX MICROSCOPIC
Bilirubin Urine: NEGATIVE
Hgb urine dipstick: NEGATIVE
Ketones, ur: NEGATIVE
Leukocytes,Ua: NEGATIVE
Nitrite: NEGATIVE
RBC / HPF: NONE SEEN (ref 0–?)
Specific Gravity, Urine: 1.01 (ref 1.000–1.030)
Total Protein, Urine: NEGATIVE
Urine Glucose: NEGATIVE
Urobilinogen, UA: 0.2 (ref 0.0–1.0)
pH: 6 (ref 5.0–8.0)

## 2020-10-04 LAB — CBC
HCT: 47.2 % — ABNORMAL HIGH (ref 36.0–46.0)
Hemoglobin: 15.3 g/dL — ABNORMAL HIGH (ref 12.0–15.0)
MCHC: 32.4 g/dL (ref 30.0–36.0)
MCV: 92.5 fl (ref 78.0–100.0)
Platelets: 187 K/uL (ref 150.0–400.0)
RBC: 5.11 Mil/uL (ref 3.87–5.11)
RDW: 15.9 % — ABNORMAL HIGH (ref 11.5–15.5)
WBC: 10.8 K/uL — ABNORMAL HIGH (ref 4.0–10.5)

## 2020-10-04 LAB — COMPREHENSIVE METABOLIC PANEL
ALT: 13 U/L (ref 0–35)
AST: 13 U/L (ref 0–37)
Albumin: 3.8 g/dL (ref 3.5–5.2)
Alkaline Phosphatase: 68 U/L (ref 39–117)
BUN: 22 mg/dL (ref 6–23)
CO2: 29 mEq/L (ref 19–32)
Calcium: 9.5 mg/dL (ref 8.4–10.5)
Chloride: 100 mEq/L (ref 96–112)
Creatinine, Ser: 1.09 mg/dL (ref 0.40–1.20)
GFR: 54.84 mL/min — ABNORMAL LOW (ref >60.00)
Glucose, Bld: 90 mg/dL (ref 70–99)
Potassium: 3.7 mEq/L (ref 3.5–5.1)
Sodium: 138 mEq/L (ref 135–145)
Total Bilirubin: 0.5 mg/dL (ref 0.2–1.2)
Total Protein: 6.9 g/dL (ref 6.0–8.3)

## 2020-10-04 LAB — LIPID PANEL
Cholesterol: 127 mg/dL (ref 0–200)
HDL: 46.4 mg/dL
LDL Cholesterol: 62 mg/dL (ref 0–99)
NonHDL: 80.98
Total CHOL/HDL Ratio: 3
Triglycerides: 97 mg/dL (ref 0.0–149.0)
VLDL: 19.4 mg/dL (ref 0.0–40.0)

## 2020-10-04 LAB — VITAMIN D 25 HYDROXY (VIT D DEFICIENCY, FRACTURES): VITD: 40.84 ng/mL (ref 30.00–100.00)

## 2020-10-04 NOTE — Progress Notes (Signed)
Per the orders of Dr, Dagoberto Reef pt is here for abs pt tolerated draw well.

## 2020-10-19 ENCOUNTER — Ambulatory Visit (INDEPENDENT_AMBULATORY_CARE_PROVIDER_SITE_OTHER): Payer: Medicare HMO

## 2020-10-19 DIAGNOSIS — I10 Essential (primary) hypertension: Secondary | ICD-10-CM

## 2020-10-19 DIAGNOSIS — E785 Hyperlipidemia, unspecified: Secondary | ICD-10-CM | POA: Diagnosis not present

## 2020-10-19 NOTE — Patient Instructions (Signed)
Visit Information :  Thank you for taking the time to speak with me today.   PATIENT GOALS:   Goals Addressed             This Visit's Progress    Track and Manage My Blood Pressure-Hypertension   On track    Timeframe:  Long-Range Goal Priority:  Medium Start Date:     10/19/2020                        Expected End Date:  02/01/2021                     Follow Up Date 12/07/2020    - check blood pressure at least 1-2 times per month and record- write blood pressure results in a log or diary - take your medications as prescribed - Follow up with your doctors as recommended - notify your doctor of new or ongoing symptoms.  - Follow a low salt diet. ( See education article on low salt diet sent to you in Mychart   Why is this important?   You won't feel high blood pressure, but it can still hurt your blood vessels.  High blood pressure can cause heart or kidney problems. It can also cause a stroke.  Making lifestyle changes like losing a little weight or eating less salt will help.  Checking your blood pressure at home and at different times of the day can help to control blood pressure.  If the doctor prescribes medicine remember to take it the way the doctor ordered.  Call the office if you cannot afford the medicine or if there are questions about it.     Notes:         Consent to CCM Services: Ms. Stodghill was given information about Chronic Care Management services including:  CCM service includes personalized support from designated clinical staff supervised by her physician, including individualized plan of care and coordination with other care providers 24/7 contact phone numbers for assistance for urgent and routine care needs. Service will only be billed when office clinical staff spend 20 minutes or more in a month to coordinate care. Only one practitioner may furnish and bill the service in a calendar month. The patient may stop CCM services at any time (effective at  the end of the month) by phone call to the office staff. The patient will be responsible for cost sharing (co-pay) of up to 20% of the service fee (after annual deductible is met).  Patient agreed to services and verbal consent obtained.   Patient verbalizes understanding of instructions provided today and agrees to view in [place].   The patient has been provided with contact information for the care management team and has been advised to call with any health related questions or concerns.  The care management team will reach out to the patient again over the next 2 months.   Quinn Plowman RN,BSN,CCM RN Case Manager [place] [phone]   CLINICAL CARE PLAN: Patient Care Plan: Cardiovascular     Problem Identified: Hypertension (Hypertension)   Priority: Medium     Long-Range Goal: Hypertension Monitored   Start Date: 10/19/2020  Expected End Date: 02/01/2021  This Visit's Progress: On track  Priority: Medium  Note:   Objective:  Last practice recorded BP readings:  BP Readings from Last 3 Encounters:  09/30/20 122/70  09/12/20 (!) 159/96  08/02/20 111/72  Current Barriers:  Knowledge Deficits related  to long term care plan for self management of Hypertension / hyperlipidemia:  Patient reports having her annual wellness visit with her provider on 09/30/2020. She states she is able to afford her medications and takes them as prescribed. Patient states she does not monitor her blood pressures.  Patient states she wants to work on losing weight.  Current weight  is 273 lbs.  Case Manager Clinical Goal(s):  patient will verbalize understanding of plan for managing hypertension / hyperlipidemia patient will attend scheduled medical appointments patient will demonstrate improved adherence to prescribed treatment plan for hypertension as evidenced by taking all medications as prescribed, monitoring and recording blood pressure as directed, adhering to low  sodium/DASH diet Interventions:  Collaboration with Libby Maw, MD regarding development and update of comprehensive plan of care as evidenced by provider attestation and co-signature Inter-disciplinary care team collaboration (see longitudinal plan of care) Evaluation of current treatment plan related to hypertension self management and patient's adherence to plan as established by provider. Provided education to patient re: stroke prevention, s/s of heart attack and stroke, DASH diet, complications of uncontrolled blood pressure Reviewed medications with patient and discussed importance of compliance Discussed plans with patient for ongoing care management follow up and provided patient with direct contact information for care management team Advised patient, providing education and rationale, to monitor blood pressure daily and record, calling PCP for findings outside established parameters.  Reviewed scheduled/upcoming provider appointments including:  Patient Goals: - check blood pressure at least 1-2 times per month and record- write blood pressure results in a log or diary - take your medications as prescribed - Follow up with your doctors as recommended - notify your doctor of new or ongoing symptoms.  - Follow a low salt diet. ( See education article on low salt diet sent to you in Mychart Follow Up Plan: The patient has been provided with contact information for the care management team and has been advised to call with any health related questions or concerns.  The care management team will reach out to the patient again over the next 2 months.

## 2020-10-19 NOTE — Chronic Care Management (AMB) (Signed)
Chronic Care Management   CCM RN Visit Note  10/19/2020 Name: Kathy Herrera [facility] MRN: [id number] DOB: 08-15-1960  Subjective: Kathy Herrera is a 60 y.o. year old female who is a primary care patient of Libby Maw, MD. The care management team was consulted for assistance with disease management and care coordination needs.    Engaged with patient by telephone for initial visit in response to provider referral for case management and/or care coordination services.   Consent to Services:  The patient was given the following information about Chronic Care Management services today, agreed to services, and gave verbal consent: 1. CCM service includes personalized support from designated clinical staff supervised by the primary care provider, including individualized plan of care and coordination with other care providers 2. 24/7 contact phone numbers for assistance for urgent and routine care needs. 3. Service will only be billed when office clinical staff spend 20 minutes or more in a month to coordinate care. 4. Only one practitioner may furnish and bill the service in a calendar month. 5.The patient may stop CCM services at any time (effective at the end of the month) by phone call to the office staff. 6. The patient will be responsible for cost sharing (co-pay) of up to 20% of the service fee (after annual deductible is met). Patient agreed to services and consent obtained.  Patient agreed to services and verbal consent obtained.   Assessment: Review of patient past medical history, allergies, medications, health status, including review of consultants reports, laboratory and other test data, was performed as part of comprehensive evaluation and provision of chronic care management services.   SDOH (Social Determinants of Health) assessments and interventions performed:  SDOH Interventions    Flowsheet Row Most Recent Value  SDOH Interventions   Food Insecurity Interventions  Intervention Not Indicated  Housing Interventions Intervention Not Indicated  Transportation Interventions Intervention Not Indicated        CCM Care Plan  Allergies  Allergen Reactions   Shrimp [Shellfish Allergy] Hives    May be seasoning not always   Percocet [Oxycodone-Acetaminophen] Nausea Only    Outpatient Encounter Medications as of 10/19/2020  Medication Sig   acetaminophen (TYLENOL) 500 MG tablet Take 1,000 mg by mouth every 6 (six) hours as needed (Arthritis pain).   albuterol (VENTOLIN HFA) 108 (90 Base) MCG/ACT inhaler Inhale 2 puffs into the lungs every 6 (six) hours as needed for wheezing or shortness of breath.   aspirin EC 81 MG tablet Take 81 mg by mouth daily.   atenolol (TENORMIN) 100 MG tablet Take 1 tablet (100 mg total) by mouth daily.   cholecalciferol (VITAMIN D) 25 MCG (1000 UNIT) tablet Take 1,000 Units by mouth daily.   diclofenac Sodium (VOLTAREN) 1 % GEL Apply 4 g topically 4 (four) times daily.   hydrochlorothiazide (HYDRODIURIL) 25 MG tablet TAKE 1 TABLET EVERY DAY (Patient taking differently: Take 25 mg by mouth daily.)   lisinopril (ZESTRIL) 40 MG tablet Take 1 tablet (40 mg total) by mouth daily.   Multiple Vitamins-Minerals (MULTIVITAMIN WITH MINERALS) tablet Take 1 tablet by mouth daily.   potassium chloride (KLOR-CON) 10 MEQ tablet TAKE 1 TABLET EVERY DAY (Patient taking differently: Take 10 mEq by mouth daily.)   rosuvastatin (CRESTOR) 10 MG tablet TAKE 1 TABLET DAILY.   methimazole (TAPAZOLE) 5 MG tablet TAKE 1/2 TABLET THREE TIMES WEEKLY (Patient not taking: No sig reported)   traMADol (ULTRAM) 50 MG tablet Take 1 tablet (50 mg total) by mouth  every 6 (six) hours as needed. (Patient not taking: No sig reported)   No facility-administered encounter medications on file as of 10/19/2020.    Patient Active Problem List   Diagnosis Date Noted   Tobacco abuse counseling 09/30/2020   Abdominal aortic aneurysm (AAA) ([place]) 06/24/2020   Personal  history of colonic polyps 06/01/2020   Benign neoplasm of colon 06/01/2020   Aortic aneurysm ([place]) 06/01/2020   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HCC) 06/01/2020   Arthritis of left hip 33/29/5188   LSC (lichen simplex chronicus) 09/11/2019   Vitamin D deficiency 12/28/2018   Elevated hemoglobin ([place]) 10/29/2018   Crohn's disease of ileum, unspecified complication ([place]) 41/66/0630   ASCVD (arteriosclerotic cardiovascular disease) 10/28/2018   Healthcare maintenance 08/21/2016   Enterocolitis 07/14/2015   Perforated viscus 07/14/2015   Hyperthyroidism 12/23/2014   Spinal stenosis, lumbar region, with neurogenic claudication 02/02/2014    Class: Chronic   Spondylolisthesis of lumbar region 02/02/2014    Class: Chronic   Lumbar degenerative disc disease 02/02/2014    Class: Chronic   Morbid obesity ([place]) 02/11/2013   Tobacco abuse 09/17/2012   Essential hypertension    Other hyperlipidemia     Conditions to be addressed/monitored:HTN and HLD  Care Plan : Cardiovascular  Updates made by Dannielle Karvonen, RN since 10/19/2020 12:00 AM     Problem: Hypertension (Hypertension)   Priority: Medium     Long-Range Goal: Hypertension Monitored   Start Date: 10/19/2020  Expected End Date: 02/01/2021  This Visit's Progress: On track  Priority: Medium  Note:   Objective:  Last practice recorded BP readings:  BP Readings from Last 3 Encounters:  09/30/20 122/70  09/12/20 (!) 159/96  08/02/20 111/72  Current Barriers:  Knowledge Deficits related to long term care plan for self management of Hypertension / hyperlipidemia:  Patient reports having her annual wellness visit with her provider on 09/30/2020. She states she is able to afford her medications and takes them as prescribed. Patient states she does not monitor her blood pressures.  Patient states she wants to work on losing weight.  Current weight  is 273 lbs.  Case Manager Clinical Goal(s):  patient will verbalize  understanding of plan for managing hypertension / hyperlipidemia patient will attend scheduled medical appointments patient will demonstrate improved adherence to prescribed treatment plan for hypertension as evidenced by taking all medications as prescribed, monitoring and recording blood pressure as directed, adhering to low sodium/DASH diet Interventions:  Collaboration with Libby Maw, MD regarding development and update of comprehensive plan of care as evidenced by provider attestation and co-signature Inter-disciplinary care team collaboration (see longitudinal plan of care) Evaluation of current treatment plan related to hypertension self management and patient's adherence to plan as established by provider. Provided education to patient re: stroke prevention, s/s of heart attack and stroke, DASH diet, complications of uncontrolled blood pressure Reviewed medications with patient and discussed importance of compliance Discussed plans with patient for ongoing care management follow up and provided patient with direct contact information for care management team Advised patient, providing education and rationale, to monitor blood pressure daily and record, calling PCP for findings outside established parameters.  Reviewed scheduled/upcoming provider appointments including:  Patient Goals: - check blood pressure at least 1-2 times per month and record- write blood pressure results in a log or diary - take your medications as prescribed - Follow up with your doctors as recommended - notify your doctor of new or ongoing symptoms.  - Follow a  low salt diet. ( See education article on low salt diet sent to you in Mychart Follow Up Plan: The patient has been provided with contact information for the care management team and has been advised to call with any health related questions or concerns.  The care management team will reach out to the patient again over the next 2 months.            Plan:The patient has been provided with contact information for the care management team and has been advised to call with any health related questions or concerns.  and The care management team will reach out to the patient again over the next 2 months . Quinn Plowman RN,BSN,CCM RN Case Manager [place] [phone]

## 2020-10-24 ENCOUNTER — Other Ambulatory Visit: Payer: Self-pay | Admitting: *Deleted

## 2020-10-24 DIAGNOSIS — Z87891 Personal history of nicotine dependence: Secondary | ICD-10-CM

## 2020-10-24 DIAGNOSIS — F17210 Nicotine dependence, cigarettes, uncomplicated: Secondary | ICD-10-CM

## 2020-10-28 ENCOUNTER — Other Ambulatory Visit: Payer: Self-pay

## 2020-10-28 ENCOUNTER — Ambulatory Visit (INDEPENDENT_AMBULATORY_CARE_PROVIDER_SITE_OTHER)
Admission: RE | Admit: 2020-10-28 | Discharge: 2020-10-28 | Disposition: A | Discharge status: Home / Self Care | Payer: Medicare HMO | Source: Ambulatory Visit | Attending: Cardiovascular Disease | Admitting: Cardiovascular Disease

## 2020-10-28 DIAGNOSIS — Z87891 Personal history of nicotine dependence: Secondary | ICD-10-CM

## 2020-10-28 DIAGNOSIS — F17210 Nicotine dependence, cigarettes, uncomplicated: Secondary | ICD-10-CM | POA: Diagnosis not present

## 2020-10-31 NOTE — Progress Notes (Signed)
No cancer seen on CT scan.  There are smoking related changes seen or emphysema. Please stop smoking.  There is hardening of the arteries around your heart. Again, please stop smoking and be sure to take rosuvastatin.

## 2020-11-09 NOTE — Progress Notes (Signed)
Please call patient and let them  know their  low dose Ct was read as a Lung RADS 2: nodules that are benign in appearance and behavior with a very low likelihood of becoming a clinically active cancer due to size or lack of growth. Recommendation per radiology is for a repeat LDCT in 12 months. .Please let them  know we will order and schedule their  annual screening scan for 10/2021. Please let them  know there was notation of CAD on their  scan.  Please remind the patient  that this is a non-gated exam therefore degree or severity of disease  cannot be determined. Please have them  follow up with their PCP regarding potential risk factor modification, dietary therapy or pharmacologic therapy if clinically indicated. Pt.  is  currently on statin therapy. Please place order for annual  screening scan for  10/2021 and fax results to PCP. Thanks so much.

## 2020-11-10 ENCOUNTER — Encounter: Payer: Self-pay | Admitting: *Deleted

## 2020-11-10 DIAGNOSIS — Z87891 Personal history of nicotine dependence: Secondary | ICD-10-CM

## 2020-11-10 DIAGNOSIS — F17210 Nicotine dependence, cigarettes, uncomplicated: Secondary | ICD-10-CM

## 2020-11-29 ENCOUNTER — Other Ambulatory Visit: Payer: Self-pay | Admitting: Family Medicine

## 2020-11-29 DIAGNOSIS — I10 Essential (primary) hypertension: Secondary | ICD-10-CM

## 2020-11-30 ENCOUNTER — Other Ambulatory Visit: Payer: Self-pay | Admitting: Family Medicine

## 2020-11-30 DIAGNOSIS — I2510 Atherosclerotic heart disease of native coronary artery without angina pectoris: Secondary | ICD-10-CM

## 2020-11-30 DIAGNOSIS — I10 Essential (primary) hypertension: Secondary | ICD-10-CM

## 2020-12-07 ENCOUNTER — Ambulatory Visit (INDEPENDENT_AMBULATORY_CARE_PROVIDER_SITE_OTHER): Payer: Medicare HMO

## 2020-12-07 ENCOUNTER — Telehealth: Payer: Medicare HMO

## 2020-12-07 DIAGNOSIS — I10 Essential (primary) hypertension: Secondary | ICD-10-CM

## 2020-12-07 DIAGNOSIS — E785 Hyperlipidemia, unspecified: Secondary | ICD-10-CM

## 2020-12-07 NOTE — Chronic Care Management (AMB) (Signed)
Chronic Care Management   CCM RN Visit Note  12/07/2020 Name: Kathy Herrera [facility] MRN: [id number] DOB: 08/18/1960  Subjective: Kathy Herrera is a 60 y.o. year old female who is a primary care patient of Libby Maw, MD. The care management team was consulted for assistance with disease management and care coordination needs.    Engaged with patient by telephone for follow up visit in response to provider referral for case management and/or care coordination services.   Consent to Services:  The patient was given information about Chronic Care Management services, agreed to services, and gave verbal consent prior to initiation of services.  Please see initial visit note for detailed documentation.   Patient agreed to services and verbal consent obtained.   Assessment: Review of patient past medical history, allergies, medications, health status, including review of consultants reports, laboratory and other test data, was performed as part of comprehensive evaluation and provision of chronic care management services.   SDOH (Social Determinants of Health) assessments and interventions performed:    CCM Care Plan  Allergies  Allergen Reactions   Shrimp [Shellfish Allergy] Hives    May be seasoning not always   Percocet [Oxycodone-Acetaminophen] Nausea Only    Outpatient Encounter Medications as of 12/07/2020  Medication Sig   acetaminophen (TYLENOL) 500 MG tablet Take 1,000 mg by mouth every 6 (six) hours as needed (Arthritis pain).   albuterol (VENTOLIN HFA) 108 (90 Base) MCG/ACT inhaler Inhale 2 puffs into the lungs every 6 (six) hours as needed for wheezing or shortness of breath.   aspirin EC 81 MG tablet Take 81 mg by mouth daily.   atenolol (TENORMIN) 100 MG tablet TAKE 1 TABLET BY MOUTH DAILY.   cholecalciferol (VITAMIN D) 25 MCG (1000 UNIT) tablet Take 1,000 Units by mouth daily.   diclofenac Sodium (VOLTAREN) 1 % GEL Apply 4 g topically 4 (four) times daily.    hydrochlorothiazide (HYDRODIURIL) 25 MG tablet TAKE 1 TABLET EVERY DAY (Patient taking differently: Take 25 mg by mouth daily.)   lisinopril (ZESTRIL) 40 MG tablet TAKE 1 TABLET EVERY DAY   methimazole (TAPAZOLE) 5 MG tablet TAKE 1/2 TABLET THREE TIMES WEEKLY (Patient not taking: No sig reported)   Multiple Vitamins-Minerals (MULTIVITAMIN WITH MINERALS) tablet Take 1 tablet by mouth daily.   potassium chloride (KLOR-CON) 10 MEQ tablet TAKE 1 TABLET EVERY DAY (Patient taking differently: Take 10 mEq by mouth daily.)   rosuvastatin (CRESTOR) 10 MG tablet TAKE 1 TABLET DAILY.   traMADol (ULTRAM) 50 MG tablet Take 1 tablet (50 mg total) by mouth every 6 (six) hours as needed. (Patient not taking: No sig reported)   No facility-administered encounter medications on file as of 12/07/2020.    Patient Active Problem List   Diagnosis Date Noted   Tobacco abuse counseling 09/30/2020   Abdominal aortic aneurysm (AAA) 06/24/2020   Personal history of colonic polyps 06/01/2020   Benign neoplasm of colon 06/01/2020   Aortic aneurysm ([place]) 06/01/2020   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HCC) 06/01/2020   Arthritis of left hip 96/78/9381   LSC (lichen simplex chronicus) 09/11/2019   Vitamin D deficiency 12/28/2018   Elevated hemoglobin ([place]) 10/29/2018   Crohn's disease of ileum, unspecified complication ([place]) 01/75/1025   ASCVD (arteriosclerotic cardiovascular disease) 10/28/2018   Healthcare maintenance 08/21/2016   Enterocolitis 07/14/2015   Perforated viscus 07/14/2015   Hyperthyroidism 12/23/2014   Spinal stenosis, lumbar region, with neurogenic claudication 02/02/2014    Class: Chronic   Spondylolisthesis of lumbar  region 02/02/2014    Class: Chronic   Lumbar degenerative disc disease 02/02/2014    Class: Chronic   Morbid obesity ([place]) 02/11/2013   Tobacco abuse 09/17/2012   Essential hypertension    Other hyperlipidemia     Conditions to be addressed/monitored:HTN and HLD  Care  Plan : Cardiovascular  Updates made by Dannielle Karvonen, RN since 12/07/2020 12:00 AM     Problem: Hypertension (Hypertension)   Priority: Medium     Long-Range Goal: Hypertension Monitored   Start Date: 10/19/2020  Expected End Date: 03/03/2021  This Visit's Progress: On track  Recent Progress: On track  Priority: Medium  Note:   Objective:  Last practice recorded BP readings:  BP Readings from Last 3 Encounters:  09/30/20 122/70  09/12/20 (!) 159/96  08/02/20 111/72  Current Barriers:  Knowledge Deficits related to long term care plan for self management of Hypertension / hyperlipidemia:  Patient  states she is doing ok.  She reports blood pressures have ranged from 140/ 80 - 145/70.   She denies any issues with symptoms. Patient states she is trying to quit smoking. She reports having only 2 cigarettes over the past week. She states she is using the Nicoderm patches.  RNCM discussed smoking cessation and will send patient education information on smoking cessation strategies. Patient verbalized agreement.  RNCM advised patient to continue to adhere to a low salt diet / incorporate exercise 3 days per week if able.  Patient request next follow up with RNCM be in January 2023.   Case Manager Clinical Goal(s):  patient will verbalize understanding of plan for managing hypertension / hyperlipidemia patient will attend scheduled medical appointments patient will demonstrate improved adherence to prescribed treatment plan for hypertension as evidenced by taking all medications as prescribed, monitoring and recording blood pressure as directed, adhering to low sodium/DASH diet Interventions:  Collaboration with Libby Maw, MD regarding development and update of comprehensive plan of care as evidenced by provider attestation and co-signature Inter-disciplinary care team collaboration (see longitudinal plan of care) Evaluation of current treatment plan related to hypertension self  management and patient's adherence to plan as established by provider. Provided education to patient re: stroke prevention, s/s of heart attack and stroke, DASH diet, complications of uncontrolled blood pressure Reviewed medications with patient and discussed importance of compliance Discussed plans with patient for ongoing care management follow up and provided patient with direct contact information for care management team Advised patient, providing education and rationale, to monitor blood pressure daily and record, calling PCP for findings outside established parameters.  Reviewed scheduled/upcoming provider appointments:  Patient scheduled for follow up with Dr. Louanne Skye on 01/13/2021, Follow up with Dr. Loanne Drilling on 01/23/2021 and follow up with Dr. Ethelene Hal, pcp in January 2023.  Patient Goals: - Continue to check blood pressure at least 1-2 times per month and record- write blood pressure results in a log or diary - Continue to take your medications as prescribed and refill timely - Follow up with your doctors as recommended - notify your doctor of new or ongoing symptoms.  - Follow a low salt diet.  - Review smoking cessation article sent to you my MyChart/ mail.  - Incorporate exercise 2-3 times per week as tolerated Follow Up Plan: The patient has been provided with contact information for the care management team and has been advised to call with any health related questions or concerns.  The care management team will reach out to the patient again over the  next 3 months.           Plan:The patient has been provided with contact information for the care management team and has been advised to call with any health related questions or concerns.  and The care management team will reach out to the patient again over the next 1-2 months. Quinn Plowman RN,BSN,CCM RN Case Manager [place]  [phone]

## 2020-12-07 NOTE — Patient Instructions (Signed)
Visit Information:  Thank you for taking the time to speak with me today.   PATIENT GOALS:  Goals Addressed             This Visit's Progress    Track and Manage My Blood Pressure-Hypertension   On track    Timeframe:  Long-Range Goal Priority:  Medium Start Date:     10/19/2020                        Expected End Date:  04/04/2020                    Follow Up Date 03/15/2021   - Continue to check blood pressure at least 1-2 times per month and record- write blood pressure results in a log or diary - Continue to take your medications as prescribed and refill timely - Follow up with your doctors as recommended - notify your doctor of new or ongoing symptoms.  - Follow a low salt diet.  - Review smoking cessation article sent to you my MyChart/ mail.  - Incorporate exercise 2-3 times per week as tolerated   Why is this important?   You won't feel high blood pressure, but it can still hurt your blood vessels.  High blood pressure can cause heart or kidney problems. It can also cause a stroke.  Making lifestyle changes like losing a little weight or eating less salt will help.  Checking your blood pressure at home and at different times of the day can help to control blood pressure.  If the doctor prescribes medicine remember to take it the way the doctor ordered.  Call the office if you cannot afford the medicine or if there are questions about it.     Notes:         Patient verbalizes understanding of instructions provided today and agrees to view in [place].   The patient has been provided with contact information for the care management team and has been advised to call with any health related questions or concerns.  The care management team will reach out to the patient again over the next 1-2 months   Quinn Plowman RN,BSN,CCM RN Case Manager [place]  [phone]

## 2021-01-02 DIAGNOSIS — I10 Essential (primary) hypertension: Secondary | ICD-10-CM | POA: Diagnosis not present

## 2021-01-02 DIAGNOSIS — E785 Hyperlipidemia, unspecified: Secondary | ICD-10-CM | POA: Diagnosis not present

## 2021-01-11 IMAGING — CT CT CHEST LUNG CANCER SCREENING LOW DOSE W/O CM
1 of 2 series · 10 of 20 positions shown, 13 images · non-contrast
Comparison: Chest radiograph of 02/16/2016

CLINICAL DATA: Forty-nine pack-year smoking history. Current
smoker.

EXAM:
CT CHEST WITHOUT CONTRAST LOW-DOSE FOR LUNG CANCER SCREENING
TECHNIQUE: Multidetector CT imaging of the chest was performed following the
standard protocol without IV contrast.

[ct lung segmentation data · axial · 0.72mm/px · z∈[-362,-362]mm · 10 of 304 frames shown]
[frame 1/304  mediastinal]
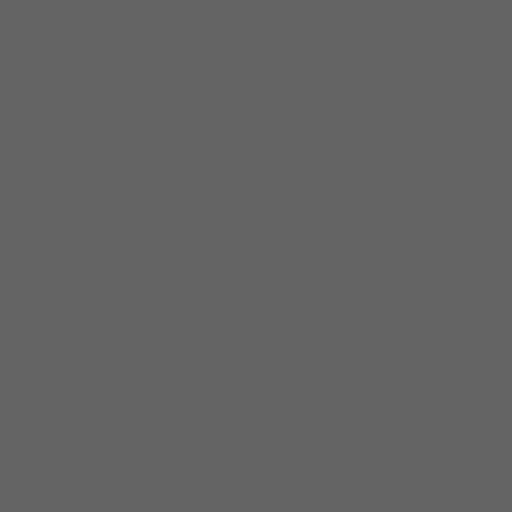
[frame 1/304  lung]
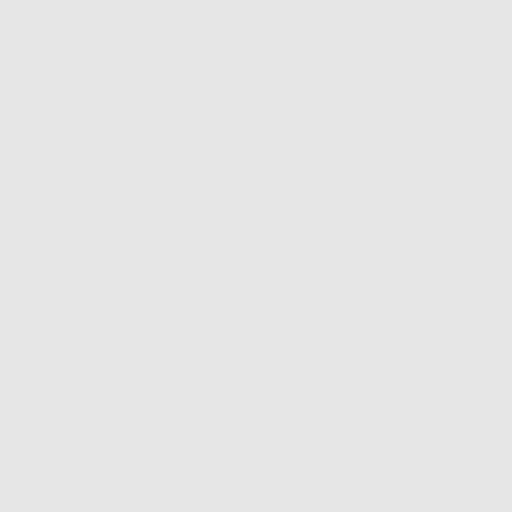
[frame 34/304  lung]
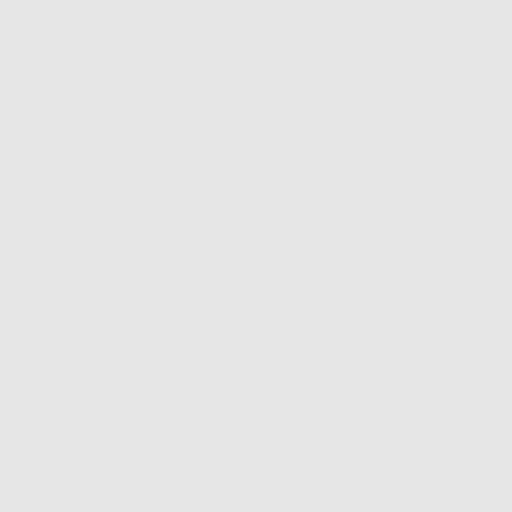
[frame 68/304  lung]
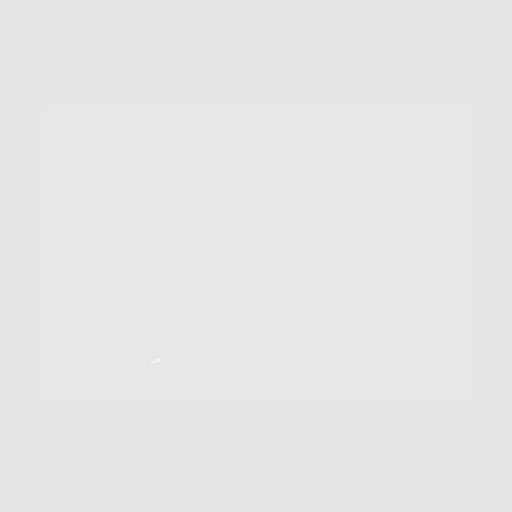
[frame 102/304  lung]
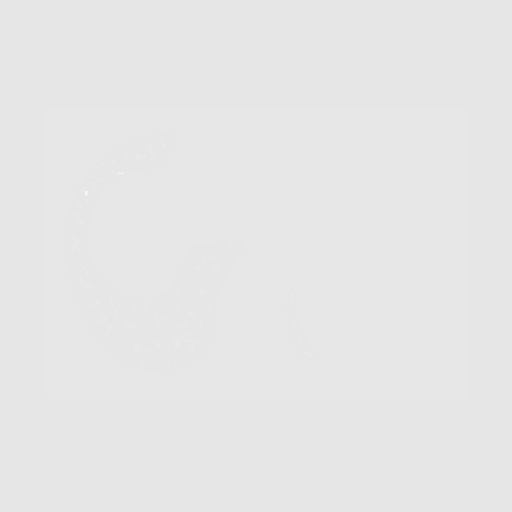
[frame 135/304  mediastinal]
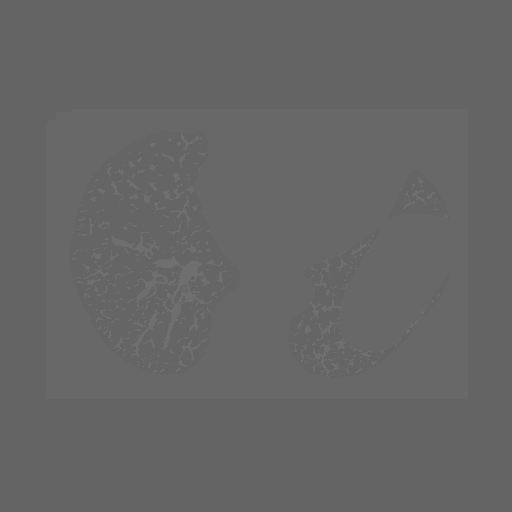
[frame 135/304  lung]
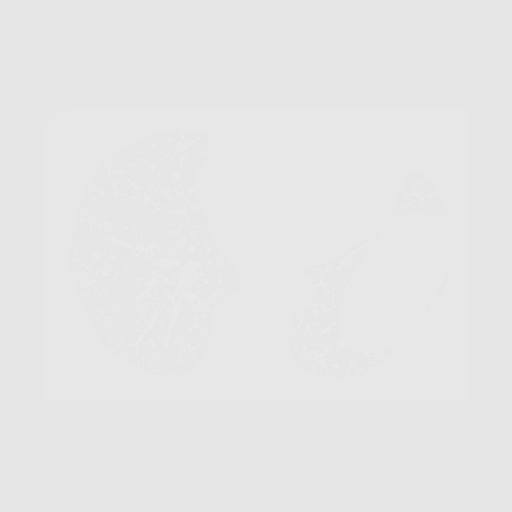
[frame 169/304  lung]
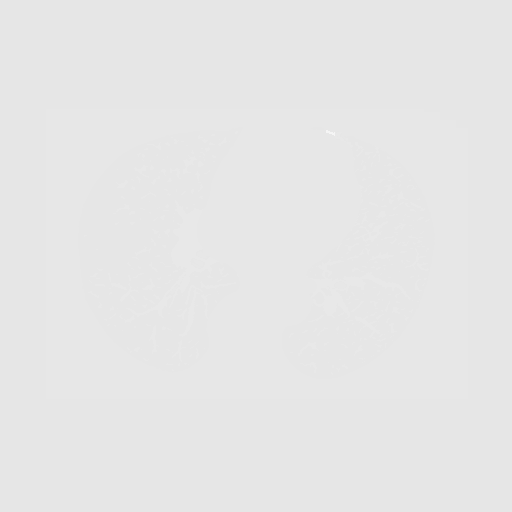
[frame 203/304  lung]
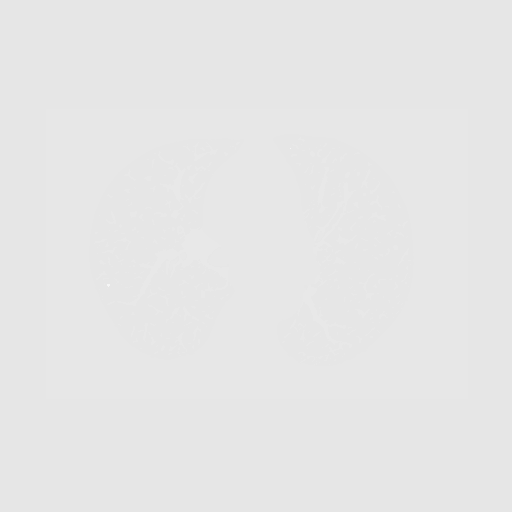
[frame 236/304  lung]
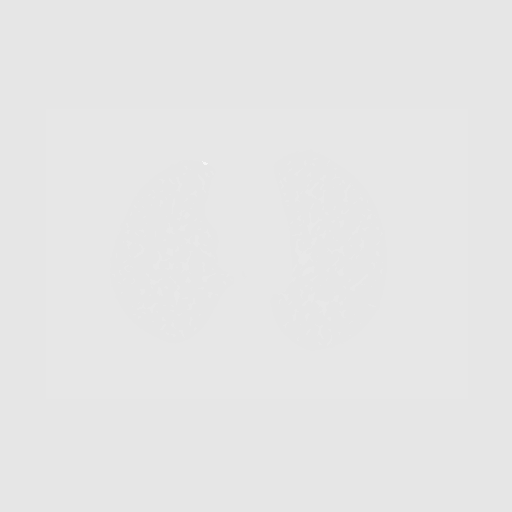
[frame 270/304  mediastinal]
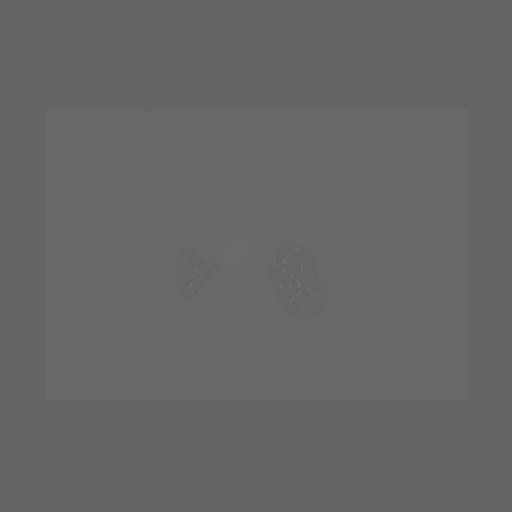
[frame 270/304  lung]
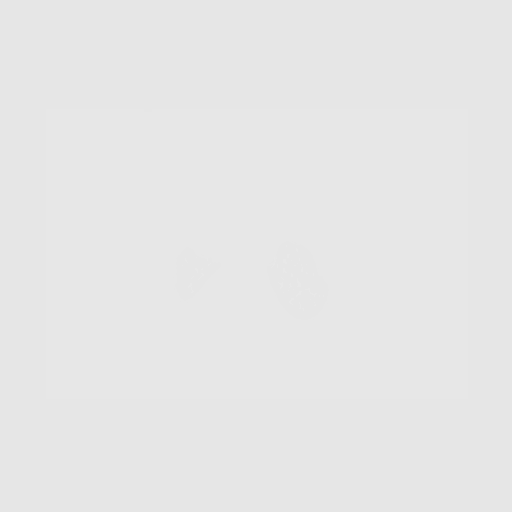
[frame 304/304  lung]
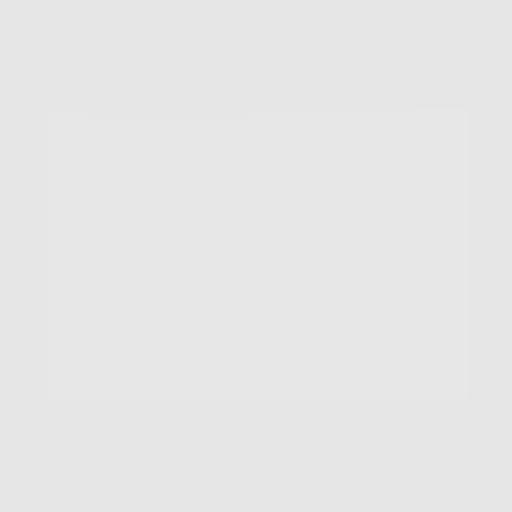

[10 of 20 positions shown; findings below may reference images not displayed]

FINDINGS: Cardiovascular: Aortic atherosclerosis. Tortuous thoracic aorta.
Borderline cardiomegaly. Lad coronary artery calcification.
Pulmonary artery enlargement, outflow tract 3.1 cm

Mediastinum/Nodes: No mediastinal or definite hilar adenopathy,
given limitations of unenhanced CT.

Lungs/Pleura: No pleural fluid. Moderate left hemidiaphragm
elevation. Mild centrilobular emphysema. Calcified and noncalcified
right-sided pulmonary nodules. The noncalcified nodules in the right
upper lobe at volume derived equivalent diameter 3.8 mm.

Upper Abdomen: Cholecystectomy. Normal imaged portions of the liver,
spleen, stomach, adrenal glands, right kidney. Interpolar left renal
lesion measures greater than fluid density and 1.6 cm on 59/2. This
was similar in size and measures less than 20 HU on the 1805
abdominal CTs, favoring a complex cyst.

Musculoskeletal: Moderate thoracic spondylosis.
IMPRESSION: 1. Lung-RADS 2, benign appearance or behavior. Continue annual
screening with low-dose chest CT without contrast in 12 months.
2. Age advanced coronary artery atherosclerosis. Recommend
assessment of coronary risk factors and consideration of medical
therapy.
3. Aortic atherosclerosis (7OQCX-XPL.L) and emphysema (7OQCX-DAO.6).
4. Left renal lesion which was similar in size to 1805, favoring a
complex cyst.

## 2021-01-13 ENCOUNTER — Ambulatory Visit: Payer: Medicare HMO | Admitting: Specialist

## 2021-01-23 ENCOUNTER — Other Ambulatory Visit: Payer: Self-pay | Admitting: Family Medicine

## 2021-01-23 ENCOUNTER — Other Ambulatory Visit: Payer: Self-pay

## 2021-01-23 ENCOUNTER — Ambulatory Visit (INDEPENDENT_AMBULATORY_CARE_PROVIDER_SITE_OTHER): Payer: Medicare HMO | Admitting: Endocrinology

## 2021-01-23 VITALS — BP 140/92 | HR 72 | Ht 67.0 in | Wt 267.8 lb

## 2021-01-23 DIAGNOSIS — E0590 Thyrotoxicosis, unspecified without thyrotoxic crisis or storm: Secondary | ICD-10-CM | POA: Diagnosis not present

## 2021-01-23 DIAGNOSIS — I2510 Atherosclerotic heart disease of native coronary artery without angina pectoris: Secondary | ICD-10-CM

## 2021-01-23 DIAGNOSIS — E785 Hyperlipidemia, unspecified: Secondary | ICD-10-CM

## 2021-01-23 LAB — T4, FREE: Free T4: 0.9 ng/dL (ref 0.60–1.60)

## 2021-01-23 LAB — TSH: TSH: 2.04 u[IU]/mL (ref 0.35–5.50)
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
## 2021-01-23 NOTE — Patient Instructions (Addendum)
Blood tests are requested for you today.  We'll let you know about the results.  Please come back for a follow-up appointment in 6 months.   

## 2021-02-06 DIAGNOSIS — H5203 Hypermetropia, bilateral: Secondary | ICD-10-CM | POA: Diagnosis not present

## 2021-02-08 ENCOUNTER — Ambulatory Visit (INDEPENDENT_AMBULATORY_CARE_PROVIDER_SITE_OTHER): Payer: Medicare HMO

## 2021-02-08 DIAGNOSIS — Z0000 Encounter for general adult medical examination without abnormal findings: Secondary | ICD-10-CM | POA: Diagnosis not present

## 2021-02-08 DIAGNOSIS — Z1231 Encounter for screening mammogram for malignant neoplasm of breast: Secondary | ICD-10-CM
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
## 2021-02-08 NOTE — Patient Instructions (Signed)
Kathy Herrera , Thank you for taking time to come for your Medicare Wellness Visit. I appreciate your ongoing commitment to your health goals. Please review the following plan we discussed and let me know if I can assist you in the future.   Screening recommendations/referrals: Colonoscopy: 02/17/2019  due 2030 Mammogram: referral completed 12/07/20222 Bone Density: not of age  Recommended yearly ophthalmology/optometry visit for glaucoma screening and checkup Recommended yearly dental visit for hygiene and checkup  Vaccinations: Influenza vaccine: declined  Pneumococcal vaccine: completed  Tdap vaccine: due  Shingles vaccine: declined     Advanced directives: none   Conditions/risks identified: none   Next appointment: none    Preventive Care 61 Years and Older, Female Preventive care refers to lifestyle choices and visits with your health care provider that can promote health and wellness. What does preventive care include? A yearly physical exam. This is also called an annual well check. Dental exams once or twice a year. Routine eye exams. Ask your health care provider how often you should have your eyes checked. Personal lifestyle choices, including: Daily care of your teeth and gums. Regular physical activity. Eating a healthy diet. Avoiding tobacco and drug use. Limiting alcohol use. Practicing safe sex. Taking low-dose aspirin every day. Taking vitamin and mineral supplements as recommended by your health care provider. What happens during an annual well check? The services and screenings done by your health care provider during your annual well check will depend on your age, overall health, lifestyle risk factors, and family history of disease. Counseling  Your health care provider may ask you questions about your: Alcohol use. Tobacco use. Drug use. Emotional well-being. Home and relationship well-being. Sexual activity. Eating habits. History of  falls. Memory and ability to understand (cognition). Work and work Statistician. Reproductive health. Screening  You may have the following tests or measurements: Height, weight, and BMI. Blood pressure. Lipid and cholesterol levels. These may be checked every 5 years, or more frequently if you are over 61 years old. Skin check. Lung cancer screening. You may have this screening every year starting at age 61 if you have a 30-pack-year history of smoking and currently smoke or have quit within the past 15 years. Fecal occult blood test (FOBT) of the stool. You may have this test every year starting at age 61. Flexible sigmoidoscopy or colonoscopy. You may have a sigmoidoscopy every 5 years or a colonoscopy every 10 years starting at age 61. Hepatitis C blood test. Hepatitis B blood test. Sexually transmitted disease (STD) testing. Diabetes screening. This is done by checking your blood sugar (glucose) after you have not eaten for a while (fasting). You may have this done every 1-3 years. Bone density scan. This is done to screen for osteoporosis. You may have this done starting at age 61. Mammogram. This may be done every 1-2 years. Talk to your health care provider about how often you should have regular mammograms. Talk with your health care provider about your test results, treatment options, and if necessary, the need for more tests. Vaccines  Your health care provider may recommend certain vaccines, such as: Influenza vaccine. This is recommended every year. Tetanus, diphtheria, and acellular pertussis (Tdap, Td) vaccine. You may need a Td booster every 10 years. Zoster vaccine. You may need this after age 61. Pneumococcal 13-valent conjugate (PCV13) vaccine. One dose is recommended after age 61. Pneumococcal polysaccharide (PPSV23) vaccine. One dose is recommended after age 65. Talk to your health care provider about which  screenings and vaccines you need and how often you need  them. This information is not intended to replace advice given to you by your health care provider. Make sure you discuss any questions you have with your health care provider. Document Released: 03/18/2015 Document Revised: 11/09/2015 Document Reviewed: 12/21/2014 Elsevier Interactive Patient Education  2017 [place] Prevention in the Home Falls can cause injuries. They can happen to people of all ages. There are many things you can do to make your home safe and to help prevent falls. What can I do on the outside of my home? Regularly fix the edges of walkways and driveways and fix any cracks. Remove anything that might make you trip as you walk through a door, such as a raised step or threshold. Trim any bushes or trees on the path to your home. Use bright outdoor lighting. Clear any walking paths of anything that might make someone trip, such as rocks or tools. Regularly check to see if handrails are loose or broken. Make sure that both sides of any steps have handrails. Any raised decks and porches should have guardrails on the edges. Have any leaves, snow, or ice cleared regularly. Use sand or salt on walking paths during winter. Clean up any spills in your garage right away. This includes oil or grease spills. What can I do in the bathroom? Use night lights. Install grab bars by the toilet and in the tub and shower. Do not use towel bars as grab bars. Use non-skid mats or decals in the tub or shower. If you need to sit down in the shower, use a plastic, non-slip stool. Keep the floor dry. Clean up any water that spills on the floor as soon as it happens. Remove soap buildup in the tub or shower regularly. Attach bath mats securely with double-sided non-slip rug tape. Do not have throw rugs and other things on the floor that can make you trip. What can I do in the bedroom? Use night lights. Make sure that you have a light by your bed that is easy to reach. Do not use  any sheets or blankets that are too big for your bed. They should not hang down onto the floor. Have a firm chair that has side arms. You can use this for support while you get dressed. Do not have throw rugs and other things on the floor that can make you trip. What can I do in the kitchen? Clean up any spills right away. Avoid walking on wet floors. Keep items that you use a lot in easy-to-reach places. If you need to reach something above you, use a strong step stool that has a grab bar. Keep electrical cords out of the way. Do not use floor polish or wax that makes floors slippery. If you must use wax, use non-skid floor wax. Do not have throw rugs and other things on the floor that can make you trip. What can I do with my stairs? Do not leave any items on the stairs. Make sure that there are handrails on both sides of the stairs and use them. Fix handrails that are broken or loose. Make sure that handrails are as long as the stairways. Check any carpeting to make sure that it is firmly attached to the stairs. Fix any carpet that is loose or worn. Avoid having throw rugs at the top or bottom of the stairs. If you do have throw rugs, attach them to the floor with carpet  tape. Make sure that you have a light switch at the top of the stairs and the bottom of the stairs. If you do not have them, ask someone to add them for you. What else can I do to help prevent falls? Wear shoes that: Do not have high heels. Have rubber bottoms. Are comfortable and fit you well. Are closed at the toe. Do not wear sandals. If you use a stepladder: Make sure that it is fully opened. Do not climb a closed stepladder. Make sure that both sides of the stepladder are locked into place. Ask someone to hold it for you, if possible. Clearly mark and make sure that you can see: Any grab bars or handrails. First and last steps. Where the edge of each step is. Use tools that help you move around (mobility aids)  if they are needed. These include: Canes. Walkers. Scooters. Crutches. Turn on the lights when you go into a dark area. Replace any light bulbs as soon as they burn out. Set up your furniture so you have a clear path. Avoid moving your furniture around. If any of your floors are uneven, fix them. If there are any pets around you, be aware of where they are. Review your medicines with your doctor. Some medicines can make you feel dizzy. This can increase your chance of falling. Ask your doctor what other things that you can do to help prevent falls. This information is not intended to replace advice given to you by your health care provider. Make sure you discuss any questions you have with your health care provider. Document Released: 12/16/2008 Document Revised: 07/28/2015 Document Reviewed: 03/26/2014 Elsevier Interactive Patient Education  2017 [employer].

## 2021-02-20 DIAGNOSIS — H43813 Vitreous degeneration, bilateral: Secondary | ICD-10-CM | POA: Diagnosis not present

## 2021-03-17 ENCOUNTER — Ambulatory Visit
Admission: RE | Admit: 2021-03-17 | Discharge: 2021-03-17 | Disposition: A | Discharge status: Home / Self Care | Payer: Medicare HMO | Source: Ambulatory Visit | Attending: Family Medicine | Admitting: Family Medicine

## 2021-03-17 ENCOUNTER — Other Ambulatory Visit: Payer: Self-pay

## 2021-03-17 DIAGNOSIS — Z1231 Encounter for screening mammogram for malignant neoplasm of breast: Secondary | ICD-10-CM | POA: Diagnosis not present

## 2021-04-03 ENCOUNTER — Other Ambulatory Visit: Payer: Self-pay

## 2021-04-03 ENCOUNTER — Ambulatory Visit (INDEPENDENT_AMBULATORY_CARE_PROVIDER_SITE_OTHER): Payer: Medicare HMO | Admitting: Family Medicine

## 2021-04-03 ENCOUNTER — Encounter: Payer: Self-pay | Admitting: Family Medicine

## 2021-04-03 VITALS — BP 122/76 | HR 73 | Temp 98.0°F | Ht 67.0 in | Wt 258.6 lb

## 2021-04-03 DIAGNOSIS — R269 Unspecified abnormalities of gait and mobility: Secondary | ICD-10-CM | POA: Diagnosis not present

## 2021-04-03 DIAGNOSIS — Z23 Encounter for immunization: Secondary | ICD-10-CM

## 2021-04-03 DIAGNOSIS — Z720 Tobacco use: Secondary | ICD-10-CM | POA: Diagnosis not present

## 2021-04-03 DIAGNOSIS — I10 Essential (primary) hypertension: Secondary | ICD-10-CM

## 2021-04-03 DIAGNOSIS — F341 Dysthymic disorder: Secondary | ICD-10-CM

## 2021-04-03 DIAGNOSIS — R69 Illness, unspecified: Secondary | ICD-10-CM | POA: Diagnosis not present

## 2021-04-03 LAB — COMPREHENSIVE METABOLIC PANEL
ALT: 11 U/L (ref 0–35)
AST: 15 U/L (ref 0–37)
Albumin: 4.2 g/dL (ref 3.5–5.2)
Alkaline Phosphatase: 75 U/L (ref 39–117)
BUN: 14 mg/dL (ref 6–23)
CO2: 28 mEq/L (ref 19–32)
Calcium: 10.2 mg/dL (ref 8.4–10.5)
Chloride: 102 mEq/L (ref 96–112)
Creatinine, Ser: 1.15 mg/dL (ref 0.40–1.20)
GFR: 51.25 mL/min — ABNORMAL LOW (ref >60.00)
Glucose, Bld: 95 mg/dL (ref 70–99)
Potassium: 3.6 mEq/L (ref 3.5–5.1)
Sodium: 140 mEq/L (ref 135–145)
Total Bilirubin: 0.6 mg/dL (ref 0.2–1.2)
Total Protein: 7.5 g/dL (ref 6.0–8.3)

## 2021-04-03 LAB — CBC
HCT: 48.2 % — ABNORMAL HIGH (ref 36.0–46.0)
Hemoglobin: 15.7 g/dL — ABNORMAL HIGH (ref 12.0–15.0)
MCHC: 32.7 g/dL (ref 30.0–36.0)
MCV: 91.4 fl (ref 78.0–100.0)
Platelets: 218 10*3/uL (ref 150.0–400.0)
RBC: 5.27 Mil/uL — ABNORMAL HIGH (ref 3.87–5.11)
RDW: 15.2 % (ref 11.5–15.5)
WBC: 9.1 10*3/uL (ref 4.0–10.5)
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
## 2021-04-04 DIAGNOSIS — R531 Weakness: Secondary | ICD-10-CM | POA: Diagnosis not present

## 2021-04-12 ENCOUNTER — Telehealth: Payer: Medicare HMO

## 2021-04-26 ENCOUNTER — Ambulatory Visit (INDEPENDENT_AMBULATORY_CARE_PROVIDER_SITE_OTHER): Payer: Medicare HMO

## 2021-04-26 DIAGNOSIS — E785 Hyperlipidemia, unspecified: Secondary | ICD-10-CM

## 2021-04-26 DIAGNOSIS — I10 Essential (primary) hypertension: Secondary | ICD-10-CM

## 2021-04-26 NOTE — Patient Instructions (Signed)
Visit Information  Thank you for taking time to visit with me today. Please don't hesitate to contact me if I can be of assistance to you before our next scheduled telephone appointment.  Following are the goals we discussed today:  Take medications as prescribed   Attend all scheduled provider appointments Call pharmacy for medication refills 3-7 days in advance of running out of medications Call provider office for new concerns or questions  Consider assistance with smoking cessation Continue to follow low salt heart healthy diet/  Our next appointment is by telephone on 06/21/21 at 10:30am   Please call the care guide team at [phone] if you need to cancel or reschedule your appointment.   If you are experiencing a Mental Health or [place] or need someone to talk to, please call the Suicide and Crisis Lifeline: 988 call 1-800-273-TALK (toll free, 24 hour hotline)   Patient verbalizes understanding of instructions and care plan provided today and agrees to view in [place]. Active MyChart status confirmed with patient.    Quinn Plowman RN,BSN,CCM RN Case Manager [place] [phone]

## 2021-04-26 NOTE — Chronic Care Management (AMB) (Signed)
Chronic Care Management   CCM RN Visit Note  04/26/2021 Name: Kathy Herrera [facility] MRN: [id number] DOB: [birth year]  Subjective: Kathy Herrera is a 61 y.o. year old female who is a primary care patient of Libby Maw, MD. The care management team was consulted for assistance with disease management and care coordination needs.    Engaged with patient by telephone for follow up visit in response to provider referral for case management and/or care coordination services.   Consent to Services:  The patient was given information about Chronic Care Management services, agreed to services, and gave verbal consent prior to initiation of services.  Please see initial visit note for detailed documentation.   Patient agreed to services and verbal consent obtained.   Assessment: Review of patient past medical history, allergies, medications, health status, including review of consultants reports, laboratory and other test data, was performed as part of comprehensive evaluation and provision of chronic care management services.   SDOH (Social Determinants of Health) assessments and interventions performed:    CCM Care Plan  Allergies  Allergen Reactions   Shrimp [Shellfish Allergy] Hives    May be seasoning not always   Percocet [Oxycodone-Acetaminophen] Nausea Only    Outpatient Encounter Medications as of 04/26/2021  Medication Sig   acetaminophen (TYLENOL) 500 MG tablet Take 1,000 mg by mouth every 6 (six) hours as needed (Arthritis pain).   albuterol (VENTOLIN HFA) 108 (90 Base) MCG/ACT inhaler Inhale 2 puffs into the lungs every 6 (six) hours as needed for wheezing or shortness of breath.   aspirin EC 81 MG tablet Take 81 mg by mouth daily.   atenolol (TENORMIN) 100 MG tablet TAKE 1 TABLET BY MOUTH DAILY.   cholecalciferol (VITAMIN D) 25 MCG (1000 UNIT) tablet Take 1,000 Units by mouth daily.   diclofenac Sodium (VOLTAREN) 1 % GEL Apply 4 g topically 4 (four) times daily.    hydrochlorothiazide (HYDRODIURIL) 25 MG tablet TAKE 1 TABLET EVERY DAY (Patient taking differently: Take 25 mg by mouth daily.)   lisinopril (ZESTRIL) 40 MG tablet TAKE 1 TABLET EVERY DAY   Multiple Vitamins-Minerals (MULTIVITAMIN WITH MINERALS) tablet Take 1 tablet by mouth daily.   potassium chloride (KLOR-CON) 10 MEQ tablet TAKE 1 TABLET EVERY DAY (Patient taking differently: Take 10 mEq by mouth daily.)   rosuvastatin (CRESTOR) 10 MG tablet TAKE 1 TABLET EVERY DAY   No facility-administered encounter medications on file as of 04/26/2021.    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[place]) 06/01/2020   Arthritis of left hip 32/99/2426   LSC (lichen simplex chronicus) 09/11/2019   Vitamin D deficiency 12/28/2018   Elevated hemoglobin ([place]) 10/29/2018   Crohn's disease of ileum, unspecified complication ([place]) 83/41/9622   ASCVD (arteriosclerotic cardiovascular disease) 10/28/2018   Healthcare maintenance 08/21/2016   Enterocolitis 07/14/2015   Perforated viscus 07/14/2015   Hyperthyroidism 12/23/2014   Spinal stenosis, lumbar region, with neurogenic claudication 02/02/2014    Class: Chronic   Spondylolisthesis of lumbar region 02/02/2014    Class: Chronic   Lumbar degenerative disc disease 02/02/2014    Class: Chronic   Morbid obesity ([place]) 02/11/2013   Tobacco abuse 09/17/2012   Essential hypertension  Hyperlipidemia     Conditions to be addressed/monitored:HTN and HLD  Care Plan : Cardiovascular  Updates made by Dannielle Karvonen, RN since 04/26/2021 12:00 AM     Problem: Hypertension (Hypertension)   Priority: Medium     Long-Range Goal: Hypertension  Monitored   Start Date: 10/19/2020  Expected End Date: 03/03/2021  Recent Progress: On track  Priority: Medium  Note:   Objective:  Last practice recorded BP readings:  BP Readings from Last 3 Encounters:  09/30/20 122/70  09/12/20 (!) 159/96  08/02/20 111/72  Current Barriers:  Knowledge Deficits related to long term care plan for self management of Hypertension / hyperlipidemia:  Patient  states she is doing ok.  She reports blood pressures have ranged from 140/ 80 - 145/70.   She denies any issues with symptoms. Patient states she is trying to quit smoking. She reports having only 2 cigarettes over the past week. She states she is using the Nicoderm patches.  RNCM discussed smoking cessation and will send patient education information on smoking cessation strategies. Patient verbalized agreement.  RNCM advised patient to continue to adhere to a low salt diet / incorporate exercise 3 days per week if able.  Patient request next follow up with RNCM be in January 2023.   Case Manager Clinical Goal(s):  patient will verbalize understanding of plan for managing hypertension / hyperlipidemia patient will attend scheduled medical appointments patient will demonstrate improved adherence to prescribed treatment plan for hypertension as evidenced by taking all medications as prescribed, monitoring and recording blood pressure as directed, adhering to low sodium/DASH diet Interventions:  Collaboration with Libby Maw, MD regarding development and update of comprehensive plan of care as evidenced by provider attestation and co-signature Inter-disciplinary care team collaboration (see longitudinal plan of care) Evaluation of current treatment plan related to hypertension self management and patient's adherence to plan as established by provider. Provided education to patient re: stroke prevention, s/s of heart attack and stroke, DASH diet, complications of uncontrolled blood pressure Reviewed  medications with patient and discussed importance of compliance Discussed plans with patient for ongoing care management follow up and provided patient with direct contact information for care management team Advised patient, providing education and rationale, to monitor blood pressure daily and record, calling PCP for findings outside established parameters.  Reviewed scheduled/upcoming provider appointments:  Patient scheduled for follow up with Dr. Louanne Skye on 01/13/2021, Follow up with Dr. Loanne Drilling on 01/23/2021 and follow up with Dr. Ethelene Hal, pcp in January 2023.  Patient Goals: - Continue to check blood pressure at least 1-2 times per month and record- write blood pressure results in a log or diary - Continue to take your medications as prescribed and refill timely - Follow up with your doctors as recommended - notify your doctor of new or ongoing symptoms.  - Follow a low salt diet.  - Review smoking cessation article sent to you my MyChart/ mail.  - Incorporate exercise 2-3 times per week as tolerated Follow Up Plan: The patient has been provided with contact information for the care management team and has been advised to call with any health related questions or concerns.  The care management team will reach out to the patient again over the next 3 months.          Care Plan : [facility] plan of care  Updates made by Dannielle Karvonen, RN since 04/26/2021 12:00 AM     Problem: Chronic disease managment education and care coordination needs  Priority: High     Long-Range Goal: Development of plan of care to address chronic disease managment and care coordination needs.   Start Date: 04/26/2021  Expected End Date: 06/30/2021  Priority: High  Note:   Current Barriers:  Knowledge Deficits related to plan of care for management of HTN and HLD  Patient reports changes in her life since last outreach with RNCM.  She reports her husband left her a few months ago.  She states it was really hard  at first but she is starting to adjust.  Patient states her son came to visit for a week with his family which was very nice.   RNCM offered follow up with social worker if counseling needed.  Patient declined at this time.  Patient reports having follow up with primary provider on 04/03/21.   Blood pressure at visit 122/76.  Patient reports checking her blood pressure 1-2 times per month at home. Denies recording them.  Patient states her blood pressure has ranged in the 120/70's.  Patient states she is trying to lose weight. Reports she is down 17 lbs.  Uses walker to ambulate. Denies any falls. Patient states she continues to smoke less than a pack per day. Not ready for any assistance to stop smoking.  RNCM Clinical Goal(s):  Patient will verbalize understanding of plan for management of HTN and HLD as evidenced by patient self report and/ or notation in chart.  take all medications exactly as prescribed and will call provider for medication related questions as evidenced by patient self report and/ or notation in chart    attend all scheduled medical appointments:   as evidenced by patient self report and/ or notation in chart        continue to work with RN Care Manager and/or Social Worker to address care management and care coordination needs related to HTN and HLD as evidenced by adherence to CM Team Scheduled appointments     through collaboration with Consulting civil engineer, provider, and care team.   Interventions: 1:1 collaboration with primary care provider regarding development and update of comprehensive plan of care as evidenced by provider attestation and co-signature Inter-disciplinary care team collaboration (see longitudinal plan of care) Evaluation of current treatment plan related to  self management and patient's adherence to plan as established by provider   Hyperlipidemia Interventions:  (Status:  Goal on track:  Yes.) Long Term Goal Medication review performed; medication list  updated in electronic medical record.  Counseled on importance of regular laboratory monitoring as prescribed Reviewed importance of limiting foods high in cholesterol  Hypertension Interventions:  (Status:  Goal on track:  Yes.) Long Term Goal Last practice recorded BP readings:  BP Readings from Last 3 Encounters:  04/03/21 122/76  01/23/21 (!) 140/92  09/30/20 122/70  Most recent eGFR/CrCl: No results found for: EGFR  No components found for: CRCL  Evaluation of current treatment plan related to hypertension self management and patient's adherence to plan as established by provider Reviewed medications with patient and discussed importance of compliance Discussed plans with patient for ongoing care management follow up and provided patient with direct contact information for care management team Reviewed scheduled/upcoming provider appointments  Discussed importance of smoking cessation.  Advised to eat a low salt diet.   Patient Goals/Self-Care Activities: Take medications as prescribed   Attend all scheduled provider appointments Call pharmacy for medication refills 3-7 days in advance of running out of medications Call provider office for new concerns or questions  Consider assistance with smoking cessation Continue to follow low salt heart healthy diet/       Plan:The patient has been provided with contact information for the care management team and has been advised to call with any health related questions or concerns.  The care management team will reach out to the patient again over the next 2 months days. Quinn Plowman RN,BSN,CCM RN Case Manager [place] [phone]

## 2021-05-02 DIAGNOSIS — E785 Hyperlipidemia, unspecified: Secondary | ICD-10-CM | POA: Diagnosis not present

## 2021-05-02 DIAGNOSIS — I10 Essential (primary) hypertension: Secondary | ICD-10-CM | POA: Diagnosis not present

## 2021-05-07 DIAGNOSIS — Z6839 Body mass index (BMI) 39.0-39.9, adult: Secondary | ICD-10-CM | POA: Diagnosis not present

## 2021-05-07 DIAGNOSIS — I739 Peripheral vascular disease, unspecified: Secondary | ICD-10-CM | POA: Diagnosis not present

## 2021-05-07 DIAGNOSIS — Z7982 Long term (current) use of aspirin: Secondary | ICD-10-CM | POA: Diagnosis not present

## 2021-05-07 DIAGNOSIS — I10 Essential (primary) hypertension: Secondary | ICD-10-CM | POA: Diagnosis not present

## 2021-05-07 DIAGNOSIS — Z720 Tobacco use: Secondary | ICD-10-CM | POA: Diagnosis not present

## 2021-05-07 DIAGNOSIS — Z008 Encounter for other general examination: Secondary | ICD-10-CM | POA: Diagnosis not present

## 2021-05-07 DIAGNOSIS — Z809 Family history of malignant neoplasm, unspecified: Secondary | ICD-10-CM | POA: Diagnosis not present

## 2021-05-07 DIAGNOSIS — M1990 Unspecified osteoarthritis, unspecified site: Secondary | ICD-10-CM | POA: Diagnosis not present

## 2021-05-07 DIAGNOSIS — E785 Hyperlipidemia, unspecified: Secondary | ICD-10-CM | POA: Diagnosis not present

## 2021-05-07 DIAGNOSIS — Z833 Family history of diabetes mellitus: Secondary | ICD-10-CM | POA: Diagnosis not present

## 2021-05-07 DIAGNOSIS — Z8612 Personal history of poliomyelitis: Secondary | ICD-10-CM | POA: Diagnosis not present

## 2021-05-07 DIAGNOSIS — K08409 Partial loss of teeth, unspecified cause, unspecified class: Secondary | ICD-10-CM | POA: Diagnosis not present

## 2021-06-06 ENCOUNTER — Ambulatory Visit: Payer: Medicare HMO | Admitting: Endocrinology

## 2021-06-06 ENCOUNTER — Encounter: Payer: Self-pay | Admitting: Endocrinology

## 2021-06-06 VITALS — BP 128/78 | Ht 67.0 in | Wt 259.0 lb

## 2021-06-06 DIAGNOSIS — E0590 Thyrotoxicosis, unspecified without thyrotoxic crisis or storm: Secondary | ICD-10-CM | POA: Diagnosis not present

## 2021-06-06 LAB — T4, FREE: Free T4: 0.62 ng/dL (ref 0.60–1.60)

## 2021-06-06 LAB — TSH: TSH: 2.51 u[IU]/mL (ref 0.35–5.50)
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
## 2021-06-06 NOTE — Patient Instructions (Addendum)
Blood tests are requested for you today.  We'll let you know about the results.  Please come back for a follow-up appointment in 6 months.   

## 2021-06-08 ENCOUNTER — Ambulatory Visit (INDEPENDENT_AMBULATORY_CARE_PROVIDER_SITE_OTHER): Payer: Medicare HMO | Admitting: Radiology

## 2021-06-08 ENCOUNTER — Encounter: Payer: Self-pay | Admitting: Radiology

## 2021-06-08 VITALS — BP 114/80 | Ht 65.5 in | Wt 256.0 lb

## 2021-06-08 DIAGNOSIS — Z8619 Personal history of other infectious and parasitic diseases: Secondary | ICD-10-CM

## 2021-06-08 DIAGNOSIS — Z9189 Other specified personal risk factors, not elsewhere classified: Secondary | ICD-10-CM | POA: Diagnosis not present

## 2021-06-08 DIAGNOSIS — Z01419 Encounter for gynecological examination (general) (routine) without abnormal findings: Secondary | ICD-10-CM

## 2021-06-08 DIAGNOSIS — Z13820 Encounter for screening for osteoporosis: Secondary | ICD-10-CM

## 2021-06-08 NOTE — Progress Notes (Signed)
? ?  [place] 1960/08/28 [id number] ? ? ?History: Postmenopausal 61 y.o. presents for annual exam. ? ? ?Gynecologic History ?Postmenopausal ?Last Pap: 2021. Results were: normal ?Last mammogram: 03/17/21. Results were: normal ?Last colonoscopy: 2020 ?HRT use: never ? ?Obstetric History ?OB History  ?Gravida Para Term Preterm AB Living  ?'4 4       4  '$ ?SAB IAB Ectopic Multiple Live Births  ?           ?  ?# Outcome Date GA Lbr Len/2nd Weight Sex Delivery Anes PTL Lv  ?4 Para           ?3 Para           ?2 Para           ?1 Para           ? ? ? ?The following portions of the patient's history were reviewed and updated as appropriate: allergies, current medications, past family history, past medical history, past social history, past surgical history, and problem list. ? ?Review of Systems ?Pertinent items noted in HPI and remainder of comprehensive ROS otherwise negative.  ?Past medical history, past surgical history, family history and social history were all reviewed and documented in the EPIC chart. ? ?Exam: ? ?Vitals:  ? 06/08/21 1120  ?BP: 114/80  ?Weight: 256 lb (116.1 kg)  ?Height: 5' 5.5" (1.664 m)  ? ?Body mass index is 41.95 kg/m?. ? ?General appearance:  Normal ?Thyroid:  Symmetrical, normal in size, without palpable masses or nodularity. ?Respiratory ? Auscultation:  Clear without wheezing or rhonchi ?Cardiovascular ? Auscultation:  Regular rate, without rubs, murmurs or gallops ? Edema/varicosities:  Not grossly evident ?Abdominal ? Soft,nontender, without masses, guarding or rebound. ? Liver/spleen:  No organomegaly noted ? Hernia:  None appreciated ? Skin ? Inspection:  Grossly normal ?Breasts: Examined lying and sitting.  ? Right: Without masses, retractions, nipple discharge or axillary adenopathy. ? ? Left: Without masses, retractions, nipple discharge or axillary adenopathy. ?Genitourinary  ? Inguinal/mons:  Normal without inguinal adenopathy ? External genitalia:  Normal appearing vulva with no  masses, tenderness, or lesions ? BUS/Urethra/Skene's glands:  Normal ? Vagina:  Normal appearing with normal color and discharge, no lesions. Atrophy: moderate  ? Cervix:  Normal appearing without discharge or lesions ? Uterus/Adnexa/parametria:  unable to fully assess due to large habitus ? Anus and perineum: Normal ?  ? ?Patient informed chaperone available to be present for breast and pelvic exam. Patient has requested no chaperone to be present. Patient has been advised what will be completed during breast and pelvic exam.  ? ?Assessment/Plan:   ?1. Well woman exam with routine gynecological exam ?Pap due 2024 ? ?2. Osteoporosis screening ? ?- DG Bone Density; Future  ? ?Discussed SBE, colonoscopy and DEXA screening as directed. Recommend 153mns of exercise weekly, including weight bearing exercise. Encouraged the use of seatbelts and sunscreen.  ?Return in 1 year for annual or sooner prn. ? ?CKerry DoryWHNP-BC, 11:33 AM 06/08/2021  ?

## 2021-06-21 ENCOUNTER — Telehealth: Payer: Medicare HMO

## 2021-06-28 ENCOUNTER — Ambulatory Visit (INDEPENDENT_AMBULATORY_CARE_PROVIDER_SITE_OTHER): Payer: Medicare HMO

## 2021-06-28 DIAGNOSIS — I10 Essential (primary) hypertension: Secondary | ICD-10-CM

## 2021-06-28 DIAGNOSIS — E785 Hyperlipidemia, unspecified: Secondary | ICD-10-CM

## 2021-06-28 NOTE — Patient Instructions (Signed)
Visit Information ? ?Thank you for taking time to visit with me today. Please don't hesitate to contact me if I can be of assistance to you before our next scheduled telephone appointment. ? ?Following are the goals we discussed today:  ?Continue to take medications as prescribed   ?Attend all scheduled provider appointments ?Call pharmacy for medication refills 3-7 days in advance of running out of medications ?Call provider office for new concerns or questions  ?Consider assistance with smoking cessation. Talk with provider regarding smoking cessation patches.  ?Continue to follow low salt, heart healthy diet ?Continue exercising. ? ?Our next appointment is by telephone on 08/16/21 at 1:00 pm ? ?Please call the care guide team at [phone] if you need to cancel or reschedule your appointment.  ? ?If you are experiencing a Mental Health or [place] or need someone to talk to, please call the Suicide and Crisis Lifeline: 988 ?call 1-800-273-TALK (toll free, 24 hour hotline)  ? ?Patient verbalizes understanding of instructions and care plan provided today and agrees to view in [place]. Active MyChart status confirmed with patient.   ? ?Quinn Plowman RN,BSN,CCM ?RN Case Manager ?[place] ?[phone] ? ?

## 2021-06-28 NOTE — Chronic Care Management (AMB) (Signed)
?Chronic Care Management  ? ?CCM RN Visit Note ? ?06/28/2021 ?Name: Kathy Herrera [facility] MRN: [id number] DOB: 01-05-1961 ? ?Subjective: ?Kathy Herrera is a 61 y.o. year old female who is a primary care patient of Libby Maw, MD. The care management team was consulted for assistance with disease management and care coordination needs.   ? ?Engaged with patient by telephone for follow up visit in response to provider referral for case management and/or care coordination services.  ? ?Consent to Services:  ?The patient was given information about Chronic Care Management services, agreed to services, and gave verbal consent prior to initiation of services.  Please see initial visit note for detailed documentation.  ? ?Patient agreed to services and verbal consent obtained.  ? ?Assessment: Review of patient past medical history, allergies, medications, health status, including review of consultants reports, laboratory and other test data, was performed as part of comprehensive evaluation and provision of chronic care management services.  ? ?SDOH (Social Determinants of Health) assessments and interventions performed:   ? ?CCM Care Plan ? ?Allergies  ?Allergen Reactions  ? Shrimp [Shellfish Allergy] Hives  ?  May be seasoning not always  ? Percocet [Oxycodone-Acetaminophen] Nausea Only  ? ? ?Outpatient Encounter Medications as of 06/28/2021  ?Medication Sig  ? acetaminophen (TYLENOL) 500 MG tablet Take 1,000 mg by mouth every 6 (six) hours as needed (Arthritis pain).  ? albuterol (VENTOLIN HFA) 108 (90 Base) MCG/ACT inhaler Inhale 2 puffs into the lungs every 6 (six) hours as needed for wheezing or shortness of breath.  ? aspirin EC 81 MG tablet Take 81 mg by mouth daily.  ? atenolol (TENORMIN) 100 MG tablet TAKE 1 TABLET BY MOUTH DAILY.  ? cholecalciferol (VITAMIN D) 25 MCG (1000 UNIT) tablet Take 1,000 Units by mouth daily.  ? diclofenac Sodium (VOLTAREN) 1 % GEL Apply 4 g topically 4 (four) times daily.  ?  hydrochlorothiazide (HYDRODIURIL) 25 MG tablet TAKE 1 TABLET EVERY DAY (Patient taking differently: Take 25 mg by mouth daily.)  ? lisinopril (ZESTRIL) 40 MG tablet TAKE 1 TABLET EVERY DAY  ? Multiple Vitamins-Minerals (MULTIVITAMIN WITH MINERALS) tablet Take 1 tablet by mouth daily.  ? potassium chloride (KLOR-CON) 10 MEQ tablet TAKE 1 TABLET EVERY DAY (Patient taking differently: Take 10 mEq by mouth daily.)  ? rosuvastatin (CRESTOR) 10 MG tablet TAKE 1 TABLET EVERY DAY  ? vitamin B-12 (CYANOCOBALAMIN) 100 MCG tablet Take 100 mcg by mouth daily.  ? ?No facility-administered encounter medications on file as of 06/28/2021.  ? ? ?Patient Active Problem List  ? Diagnosis Date Noted  ? Need for influenza vaccination 04/03/2021  ? Dysthymia 04/03/2021  ? Tobacco abuse counseling 09/30/2020  ? Abdominal aortic aneurysm (AAA) ([place]) 06/24/2020  ? Personal history of colonic polyps 06/01/2020  ? Benign neoplasm of colon 06/01/2020  ? Aortic aneurysm ([place]) 06/01/2020  ? 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 Arthritis of left hip 04/18/2020  ? LSC (lichen simplex chronicus) 09/11/2019  ? Vitamin D deficiency 12/28/2018  ? Elevated hemoglobin ([place]) 10/29/2018  ? Crohn's disease of ileum, unspecified complication ([place]) 78/29/5621  ? ASCVD (arteriosclerotic cardiovascular disease) 10/28/2018  ? Healthcare maintenance 08/21/2016  ? Enterocolitis 07/14/2015  ? Perforated viscus 07/14/2015  ? Hyperthyroidism 12/23/2014  ? Spinal stenosis, lumbar region, with neurogenic claudication 02/02/2014  ?  Class: Chronic  ? Spondylolisthesis of lumbar region 02/02/2014  ?  Class: Chronic  ? Lumbar degenerative disc disease 02/02/2014  ?  Class: Chronic  ?  Morbid obesity ([place]) 02/11/2013  ? Tobacco abuse 09/17/2012  ? Essential hypertension   ? Hyperlipidemia   ? ? ?Conditions to be addressed/monitored:HTN and HLD ? ?Care Plan : RNCM plan of care  ?Updates made by Dannielle Karvonen, RN since 06/28/2021 12:00 AM  ?  ? ?Problem:  Chronic disease managment education and care coordination needs   ?Priority: High  ?  ? ?Long-Range Goal: Development of plan of care to address chronic disease managment and care coordination needs.   ?Start Date: 04/26/2021  ?Expected End Date: 09/01/2021  ?Priority: High  ?Note:   ?Current Barriers:  ?Knowledge Deficits related to plan of care for management of HTN and HLD  ?Patient reports having follow up with primary care provider on 06/06/21.  She reports her blood pressure ranges: 114/70 to 120's/ 60.   She reports she is still working on [employer] management by using stretch bands and walking.  Patient states she has attempted to stop smoking x 1 month but started back.  She states she is smoking less.  Per chart review patients next follow up with primary care provider is 10/02/21.   ?RNCM Clinical Goal(s):  ?Patient will verbalize understanding of plan for management of HTN and HLD as evidenced by patient self report and/ or notation in chart.  ?take all medications exactly as prescribed and will call provider for medication related questions as evidenced by patient self report and/ or notation in chart    ?attend all scheduled medical appointments:   as evidenced by patient self report and/ or notation in chart        ?continue to work with Consulting civil engineer and/or Social Worker to address care management and care coordination needs related to HTN and HLD as evidenced by adherence to CM Team Scheduled appointments     through collaboration with Consulting civil engineer, provider, and care team.  ? ?Interventions: ?1:1 collaboration with primary care provider regarding development and update of comprehensive plan of care as evidenced by provider attestation and co-signature ?Inter-disciplinary care team collaboration (see longitudinal plan of care) ?Evaluation of current treatment plan related to  self management and patient's adherence to plan as established by provider ? ?Hyperlipidemia Interventions:  (Status:  Goal on  track:  Yes.) Long Term Goal ?Medication reviewed and discussed importance of compliance  ?Reviewed importance of limiting foods high in cholesterol ?Advised to incorporate exercise in daily routine ?Discussed plans with patient for ongoing care management follow up and provided patient with direct contact information for care management team ? ?Hypertension Interventions:  (Status:  Goal on track:  Yes.) Long Term Goal ?Last practice recorded BP readings:  ?BP Readings from Last 3 Encounters:  ?06/08/21 114/80  ?06/06/21 128/78  ?04/03/21 122/76  ?Most recent eGFR/CrCl: No results found for: EGFR  No components found for: CRCL ? ?Evaluation of current treatment plan related to hypertension self management and patient's adherence to plan as established by provider ?Reviewed medications with patient and discussed importance of compliance ?Discussed plans with patient for ongoing care management follow up and provided patient with direct contact information for care management team ?Reviewed scheduled/upcoming provider appointments  ?Discussed importance of smoking cessation. Advised to talk with provider regarding smoking cessation patches  ?Advised to eat a low salt diet.  ? ?Patient Goals/Self-Care Activities: ?Continue to take medications as prescribed   ?Attend all scheduled provider appointments ?Call pharmacy for medication refills 3-7 days in advance of running out of medications ?Call provider office for new concerns  or questions  ?Consider assistance with smoking cessation. Talk with provider regarding smoking cessation patches.  ?Continue to follow low salt, heart healthy diet ?Continue exercising. ?  ?  ? ? ?Plan:The patient has been provided with contact information for the care management team and has been advised to call with any health related questions or concerns.  ?The care management team will reach out to the patient again over the next 2-3 months . ?Quinn Plowman RN,BSN,CCM ?RN Case  Manager ?[facility] ?[phone] ? ? ? ? ? ? ? ? ? ?

## 2021-07-02 DIAGNOSIS — I10 Essential (primary) hypertension: Secondary | ICD-10-CM

## 2021-07-02 DIAGNOSIS — E785 Hyperlipidemia, unspecified: Secondary | ICD-10-CM

## 2021-07-02 DIAGNOSIS — R69 Illness, unspecified: Secondary | ICD-10-CM | POA: Diagnosis not present

## 2021-07-02 DIAGNOSIS — F17210 Nicotine dependence, cigarettes, uncomplicated: Secondary | ICD-10-CM | POA: Diagnosis not present

## 2021-07-18 ENCOUNTER — Other Ambulatory Visit: Payer: Self-pay | Admitting: Radiology

## 2021-07-18 ENCOUNTER — Ambulatory Visit (INDEPENDENT_AMBULATORY_CARE_PROVIDER_SITE_OTHER): Payer: Medicare HMO

## 2021-07-18 DIAGNOSIS — M85852 Other specified disorders of bone density and structure, left thigh: Secondary | ICD-10-CM

## 2021-07-18 DIAGNOSIS — Z780 Asymptomatic menopausal state: Secondary | ICD-10-CM

## 2021-07-18 DIAGNOSIS — Z13820 Encounter for screening for osteoporosis: Secondary | ICD-10-CM | POA: Diagnosis not present

## 2021-07-25 ENCOUNTER — Ambulatory Visit: Payer: Medicare HMO | Admitting: Endocrinology

## 2021-07-27 ENCOUNTER — Other Ambulatory Visit: Payer: Self-pay | Admitting: *Deleted

## 2021-07-27 DIAGNOSIS — I714 Abdominal aortic aneurysm, without rupture, unspecified: Secondary | ICD-10-CM

## 2021-08-08 ENCOUNTER — Ambulatory Visit: Payer: Medicare HMO | Admitting: Vascular Surgery

## 2021-08-08 ENCOUNTER — Other Ambulatory Visit (HOSPITAL_COMMUNITY): Payer: Medicare HMO

## 2021-08-11 NOTE — Progress Notes (Signed)
Addendum created to delete erroneous patient communication

## 2021-08-16 ENCOUNTER — Telehealth: Payer: Medicare HMO

## 2021-08-16 ENCOUNTER — Ambulatory Visit (INDEPENDENT_AMBULATORY_CARE_PROVIDER_SITE_OTHER): Payer: Medicare HMO

## 2021-08-16 DIAGNOSIS — E785 Hyperlipidemia, unspecified: Secondary | ICD-10-CM

## 2021-08-16 DIAGNOSIS — I10 Essential (primary) hypertension: Secondary | ICD-10-CM

## 2021-08-16 NOTE — Chronic Care Management (AMB) (Signed)
Chronic Care Management   CCM RN Visit Note  08/16/2021 Name: [name] [facility] MRN: [id number] DOB: 06-Jul-1960  Subjective: Kathy Herrera is a 61 y.o. year old female who is a primary care patient of Libby Maw, MD. The care management team was consulted for assistance with disease management and care coordination needs.    Engaged with patient by telephone for follow up visit in response to provider referral for case management and/or care coordination services.   Consent to Services:  The patient was given information about Chronic Care Management services, agreed to services, and gave verbal consent prior to initiation of services.  Please see initial visit note for detailed documentation.   Goal met.  Patient closed to CCM program   Assessment: Review of patient past medical history, allergies, medications, health status, including review of consultants reports, laboratory and other test data, was performed as part of comprehensive evaluation and provision of chronic care management services.   SDOH (Social Determinants of Health) assessments and interventions performed:    CCM Care Plan  Allergies  Allergen Reactions   Shrimp [Shellfish Allergy] Hives    May be seasoning not always   Percocet [Oxycodone-Acetaminophen] Nausea Only    Outpatient Encounter Medications as of 08/16/2021  Medication Sig   acetaminophen (TYLENOL) 500 MG tablet Take 1,000 mg by mouth every 6 (six) hours as needed (Arthritis pain).   albuterol (VENTOLIN HFA) 108 (90 Base) MCG/ACT inhaler Inhale 2 puffs into the lungs every 6 (six) hours as needed for wheezing or shortness of breath.   aspirin EC 81 MG tablet Take 81 mg by mouth daily.   atenolol (TENORMIN) 100 MG tablet TAKE 1 TABLET BY MOUTH DAILY.   cholecalciferol (VITAMIN D) 25 MCG (1000 UNIT) tablet Take 1,000 Units by mouth daily.   diclofenac Sodium (VOLTAREN) 1 % GEL Apply 4 g topically 4 (four) times daily.   hydrochlorothiazide  (HYDRODIURIL) 25 MG tablet TAKE 1 TABLET EVERY DAY (Patient taking differently: Take 25 mg by mouth daily.)   lisinopril (ZESTRIL) 40 MG tablet TAKE 1 TABLET EVERY DAY   Multiple Vitamins-Minerals (MULTIVITAMIN WITH MINERALS) tablet Take 1 tablet by mouth daily.   potassium chloride (KLOR-CON) 10 MEQ tablet TAKE 1 TABLET EVERY DAY (Patient taking differently: Take 10 mEq by mouth daily.)   rosuvastatin (CRESTOR) 10 MG tablet TAKE 1 TABLET EVERY DAY   vitamin B-12 (CYANOCOBALAMIN) 100 MCG tablet Take 100 mcg by mouth daily.   No facility-administered encounter medications on file as of 08/16/2021.    Patient Active Problem List   Diagnosis Date Noted   Need for influenza vaccination 04/03/2021   Dysthymia 04/03/2021   Tobacco abuse counseling 09/30/2020   Abdominal aortic aneurysm (AAA) ([place]) 06/24/2020   Personal history of colonic polyps 06/01/2020   Benign neoplasm of colon 06/01/2020   Aortic aneurysm ([place]) 06/01/2020   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[place]) 06/01/2020   Arthritis of left hip 78/67/6720   LSC (lichen simplex chronicus) 09/11/2019   Vitamin D deficiency 12/28/2018   Elevated hemoglobin ([place]) 10/29/2018   Crohn's disease of ileum, unspecified complication ([place]) 94/70/9628   ASCVD (arteriosclerotic cardiovascular disease) 10/28/2018   Healthcare maintenance 08/21/2016   Enterocolitis 07/14/2015   Perforated viscus 07/14/2015   Hyperthyroidism 12/23/2014   Spinal stenosis, lumbar region, with neurogenic claudication 02/02/2014    Class: Chronic   Spondylolisthesis of lumbar region 02/02/2014    Class: Chronic   Lumbar degenerative disc disease 02/02/2014    Class: Chronic  Morbid obesity ([place]) 02/11/2013   Tobacco abuse 09/17/2012   Essential hypertension    Hyperlipidemia     Conditions to be addressed/monitored:HTN and HLD  Care Plan : [facility] plan of care  Updates made by Dannielle Karvonen, RN since 08/16/2021 12:00 AM     Problem: Chronic disease  managment education and care coordination needs   Priority: High     Long-Range Goal: Development of plan of care to address chronic disease managment and care coordination needs.   Start Date: 04/26/2021  Expected End Date: 09/01/2021  Priority: High  Note:   Goals met.  Closed to CCM program.  Current Barriers:  Knowledge Deficits related to plan of care for management of HTN and HLD  Patient states she is doing well.  She states, " my spirit is more up beat."  Patient denies any changes with her medications. She states she is taking her medications as prescribed. She reports monitoring her blood pressure 1 x per week. She reports recent blood pressure readings are:  125/70, 130/65, 128/65, 130/70.  Patient states she is doing very well with her diet management. She reports eating more fresh fruits/ vegetables and lean meats.  She states her daughter has a vegetable garden that helps her with this.  Patient states she is doing chair exercises with stretch bands at least 4 days per week.  Reports she is still smoking. She states some days are better than others.  Per chart review next follow up with primary care provider is 10/02/21.  Congratulated patient on working hard to manage medical conditions.  Patient denies any further needs/ concerns.  Patient verbally agrees to closing out to CCM program.     RNCM Clinical Goal(s):  Patient will verbalize understanding of plan for management of HTN and HLD as evidenced by patient self report and/ or notation in chart.  take all medications exactly as prescribed and will call provider for medication related questions as evidenced by patient self report and/ or notation in chart    attend all scheduled medical appointments:   as evidenced by patient self report and/ or notation in chart        continue to work with RN Care Manager and/or Social Worker to address care management and care coordination needs related to HTN and HLD as evidenced by adherence to  CM Team Scheduled appointments     through collaboration with Consulting civil engineer, provider, and care team.   Interventions: 1:1 collaboration with primary care provider regarding development and update of comprehensive plan of care as evidenced by provider attestation and co-signature Inter-disciplinary care team collaboration (see longitudinal plan of care) Evaluation of current treatment plan related to  self management and patient's adherence to plan as established by provider  Hyperlipidemia Interventions:  (Status:  Goal on track:  Yes.) Long Term Goal Medication reviewed and discussed importance of compliance  Reviewed importance of limiting foods high in cholesterol Advised to continue to incorporate exercise in daily routine Reviewed cholesterol levels from 8/22 lab draw.  Discussed patient due yearly physical. ( Next 6 months follow up visit with primary care provider is 10/02/21)  Hypertension Interventions:  (Status:  Goal on track:  Yes.) Long Term Goal Last practice recorded BP readings:  BP Readings from Last 3 Encounters:  06/08/21 114/80  06/06/21 128/78  04/03/21 122/76  Most recent eGFR/CrCl: No results found for: "EGFR"  No components found for: "CRCL"  Evaluation of current treatment plan related to hypertension self management  and patient's adherence to plan as established by provider Reviewed medications with patient and discussed importance of compliance Reviewed scheduled/upcoming provider appointments  Discussed importance of smoking cessation. Advised to talk with provider regarding smoking cessation options at next follow up visit.  Advised to continue to eat a low salt diet.   Patient Goals/Self-Care Activities: Continue to take medications as prescribed   Attend all scheduled provider appointments Call pharmacy for medication refills 3-7 days in advance of running out of medications Call provider office for new concerns or questions  Consider assistance with  smoking cessation. Talk with provider regarding smoking cessation options at next follow up visit Continue to follow low salt, heart healthy diet Continue exercising. Continue to monitor blood pressure weekly       Plan:No further follow up required: Goals met. Patient closed to CCM program  Quinn Plowman RN,BSN,CCM RN Case Manager [place] [phone]

## 2021-08-16 NOTE — Patient Instructions (Signed)
Visit Information  Thank you for taking time to visit with me today. Please don't hesitate to contact me if I can be of assistance to you before our next scheduled telephone appointment.  Following are the goals we discussed today:  Continue to take medications as prescribed   Attend all scheduled provider appointments Call pharmacy for medication refills 3-7 days in advance of running out of medications Call provider office for new concerns or questions  Consider assistance with smoking cessation. Talk with provider regarding smoking cessation options at next follow up visit Continue to follow low salt, heart healthy diet Continue exercising. Continue to monitor blood pressure weekly  Goals met.  Patient closed to CCM program.   Please call the care guide team at [phone] if you need to cancel or reschedule your appointment.   If you are experiencing a Mental Health or [place] or need someone to talk to, please call the Suicide and Crisis Lifeline: 988 call 1-800-273-TALK (toll free, 24 hour hotline)   Patient verbalizes understanding of instructions and care plan provided today and agrees to view in [place]. Active MyChart status and patient understanding of how to access instructions and care plan via MyChart confirmed with patient.     Quinn Plowman RN,BSN,CCM RN Case Manager [place] [phone]

## 2021-08-29 ENCOUNTER — Encounter: Payer: Self-pay | Admitting: Vascular Surgery

## 2021-08-29 ENCOUNTER — Ambulatory Visit: Payer: Medicare HMO | Admitting: Vascular Surgery

## 2021-08-29 ENCOUNTER — Ambulatory Visit (HOSPITAL_COMMUNITY)
Admission: RE | Admit: 2021-08-29 | Discharge: 2021-08-29 | Disposition: A | Discharge status: Home / Self Care | Payer: Medicare HMO | Source: Ambulatory Visit | Attending: Vascular Surgery | Admitting: Vascular Surgery

## 2021-08-29 VITALS — BP 120/73 | HR 63 | Temp 97.3°F | Resp 20 | Ht 65.5 in | Wt 250.0 lb

## 2021-08-29 DIAGNOSIS — I714 Abdominal aortic aneurysm, without rupture, unspecified: Secondary | ICD-10-CM | POA: Insufficient documentation

## 2021-08-29 DIAGNOSIS — Z9889 Other specified postprocedural states: Secondary | ICD-10-CM | POA: Diagnosis not present

## 2021-08-29 DIAGNOSIS — Z8679 Personal history of other diseases of the circulatory system: Secondary | ICD-10-CM | POA: Diagnosis not present

## 2021-09-01 DIAGNOSIS — I10 Essential (primary) hypertension: Secondary | ICD-10-CM | POA: Diagnosis not present

## 2021-09-01 DIAGNOSIS — F17210 Nicotine dependence, cigarettes, uncomplicated: Secondary | ICD-10-CM

## 2021-09-01 DIAGNOSIS — E785 Hyperlipidemia, unspecified: Secondary | ICD-10-CM

## 2021-09-01 DIAGNOSIS — R69 Illness, unspecified: Secondary | ICD-10-CM | POA: Diagnosis not present

## 2021-09-29 ENCOUNTER — Ambulatory Visit: Payer: Medicare HMO | Admitting: Family Medicine

## 2021-10-02 ENCOUNTER — Ambulatory Visit: Payer: Medicare HMO | Admitting: Family Medicine

## 2021-10-11 ENCOUNTER — Encounter (INDEPENDENT_AMBULATORY_CARE_PROVIDER_SITE_OTHER): Payer: Self-pay

## 2021-10-12 ENCOUNTER — Encounter: Payer: Self-pay | Admitting: Family Medicine

## 2021-10-12 ENCOUNTER — Ambulatory Visit (INDEPENDENT_AMBULATORY_CARE_PROVIDER_SITE_OTHER): Payer: Medicare HMO | Admitting: Family Medicine

## 2021-10-12 ENCOUNTER — Telehealth: Payer: Self-pay

## 2021-10-12 VITALS — BP 138/82 | HR 74 | Temp 97.1°F | Ht 65.0 in | Wt 251.0 lb

## 2021-10-12 DIAGNOSIS — E538 Deficiency of other specified B group vitamins: Secondary | ICD-10-CM | POA: Insufficient documentation

## 2021-10-12 DIAGNOSIS — R269 Unspecified abnormalities of gait and mobility: Secondary | ICD-10-CM

## 2021-10-12 DIAGNOSIS — E785 Hyperlipidemia, unspecified: Secondary | ICD-10-CM | POA: Diagnosis not present

## 2021-10-12 DIAGNOSIS — E0590 Thyrotoxicosis, unspecified without thyrotoxic crisis or storm: Secondary | ICD-10-CM

## 2021-10-12 DIAGNOSIS — I10 Essential (primary) hypertension: Secondary | ICD-10-CM | POA: Diagnosis not present

## 2021-10-12 DIAGNOSIS — D582 Other hemoglobinopathies: Secondary | ICD-10-CM | POA: Diagnosis not present

## 2021-10-12 DIAGNOSIS — Z0000 Encounter for general adult medical examination without abnormal findings: Secondary | ICD-10-CM

## 2021-10-12 DIAGNOSIS — Z720 Tobacco use: Secondary | ICD-10-CM

## 2021-10-12 LAB — LIPID PANEL
Cholesterol: 147 mg/dL (ref 0–200)
HDL: 42.5 mg/dL (ref >39.00)
LDL Cholesterol: 76 mg/dL (ref 0–99)
NonHDL: 104.65
Total CHOL/HDL Ratio: 3
Triglycerides: 145 mg/dL (ref 0.0–149.0)
VLDL: 29 mg/dL (ref 0.0–40.0)

## 2021-10-12 LAB — URINALYSIS, ROUTINE W REFLEX MICROSCOPIC
Bilirubin Urine: NEGATIVE
Hgb urine dipstick: NEGATIVE
Ketones, ur: NEGATIVE
Leukocytes,Ua: NEGATIVE
Nitrite: NEGATIVE
Specific Gravity, Urine: 1.01 (ref 1.000–1.030)
Total Protein, Urine: NEGATIVE
Urine Glucose: NEGATIVE
Urobilinogen, UA: 0.2 (ref 0.0–1.0)
pH: 6 (ref 5.0–8.0)

## 2021-10-12 LAB — COMPREHENSIVE METABOLIC PANEL
ALT: 9 U/L (ref 0–35)
AST: 14 U/L (ref 0–37)
Albumin: 4.1 g/dL (ref 3.5–5.2)
Alkaline Phosphatase: 79 U/L (ref 39–117)
BUN: 18 mg/dL (ref 6–23)
CO2: 27 mEq/L (ref 19–32)
Calcium: 9.9 mg/dL (ref 8.4–10.5)
Chloride: 103 mEq/L (ref 96–112)
Creatinine, Ser: 1.12 mg/dL (ref 0.40–1.20)
GFR: 52.7 mL/min — ABNORMAL LOW (ref >60.00)
Glucose, Bld: 87 mg/dL (ref 70–99)
Potassium: 3.8 mEq/L (ref 3.5–5.1)
Sodium: 137 mEq/L (ref 135–145)
Total Bilirubin: 0.5 mg/dL (ref 0.2–1.2)
Total Protein: 7.7 g/dL (ref 6.0–8.3)

## 2021-10-12 LAB — VITAMIN B12: Vitamin B-12: 766 pg/mL (ref 211–911)

## 2021-10-12 LAB — T4, FREE: Free T4: 0.71 ng/dL (ref 0.60–1.60)

## 2021-10-12 LAB — TSH: TSH: 1.83 u[IU]/mL (ref 0.35–5.50)

## 2021-10-12 MED ORDER — POTASSIUM CHLORIDE CRYS ER 10 MEQ PO TBCR: 10.0000 meq | EXTENDED_RELEASE_TABLET | Freq: Every day | ORAL | 2 refills | Status: DC

## 2021-10-12 NOTE — Telephone Encounter (Signed)
Pt was notified she would have to return to have her CBC recollected due to clotting at arrival to the main lab, pt states she will come some time tomorrow between 8-5 to have this lab recollected. Order has been re-enter pt will be have lavender and blue top at time of collection.

## 2021-10-12 NOTE — Progress Notes (Addendum)
Established Patient Office Visit  Subjective   Patient ID: Kathy Herrera, female    DOB: Oct 14, 1960  Age: 61 y.o. MRN: [id number]  Chief Complaint  Patient presents with   Annual Exam    CPE, no concerns. Patient fasting.     HPI for health check and follow-up of hypertension, B12 deficiency hyperlipidemia diabetes.  She has weaned yourself down to a quarter of a pack a day and plans to continue to cessation.  Has not worked up the nerve to see the dentist yet to final teeth extracted be fitted for dentures.  Blood pressure controlled with HCTZ and lisinopril.  Continues rosuvastatin lipidemia.  Exercise is difficult for her secondary to chronic arthralgias. No regular dental care.  She lives with her son and has other children in the area as well.    Review of Systems  Constitutional:  Negative for chills, diaphoresis, malaise/fatigue and weight loss.  HENT: Negative.    Eyes: Negative.  Negative for blurred vision and double vision.  Respiratory: Negative.  Negative for cough and shortness of breath.   Cardiovascular:  Negative for chest pain.  Gastrointestinal:  Negative for abdominal pain.  Genitourinary: Negative.   Musculoskeletal:  Positive for joint pain. Negative for falls and myalgias.  Neurological:  Negative for speech change, loss of consciousness and weakness.  Psychiatric/Behavioral: Negative.        10/12/2021    9:31 AM 04/03/2021   10:38 AM 04/03/2021   10:07 AM  Depression screen PHQ 2/9  Decreased Interest 0 2 0  Down, Depressed, Hopeless 0 2 0  PHQ - 2 Score 0 4 0  Altered sleeping  2   Tired, decreased energy  1   Change in appetite  1   Feeling bad or failure about yourself   1   Trouble concentrating  0   Moving slowly or fidgety/restless  0   Suicidal thoughts  0   PHQ-9 Score  9   Difficult doing work/chores  Somewhat difficult        Objective:     BP 138/82 (BP Location: Left Arm, Patient Position: Sitting, Cuff Size: Large)   Pulse 74    Temp (!) 97.1 F (36.2 C) (Temporal)   Ht '5\' 5"'$  (1.651 m)   Wt 251 lb (113.9 kg)   SpO2 (!) 1%   BMI 41.77 kg/m    Physical Exam Constitutional:      General: She is not in acute distress.    Appearance: Normal appearance. She is not ill-appearing, toxic-appearing or diaphoretic.  HENT:     Head: Normocephalic and atraumatic.     Right Ear: Tympanic membrane, ear canal and external ear normal.     Left Ear: Tympanic membrane, ear canal and external ear normal.     Mouth/Throat:     Mouth: Mucous membranes are moist.     Dentition: Abnormal dentition.     Pharynx: Oropharynx is clear. No oropharyngeal exudate or posterior oropharyngeal erythema.  Eyes:     General: No scleral icterus.       Right eye: No discharge.        Left eye: No discharge.     Extraocular Movements: Extraocular movements intact.     Conjunctiva/sclera: Conjunctivae normal.     Pupils: Pupils are equal, round, and reactive to light.  Cardiovascular:     Rate and Rhythm: Normal rate and regular rhythm.  Pulmonary:     Effort: Pulmonary effort is normal. No respiratory distress.  Breath sounds: Normal breath sounds.  Abdominal:     General: Bowel sounds are normal.     Tenderness: There is no abdominal tenderness. There is no guarding.  Musculoskeletal:     Cervical back: No rigidity or tenderness.  Skin:    General: Skin is warm and dry.  Neurological:     Mental Status: She is alert and oriented to person, place, and time.  Psychiatric:        Mood and Affect: Mood normal.        Behavior: Behavior normal.      Results for orders placed or performed in visit on 10/12/21  Comprehensive metabolic panel  Result Value Ref Range   Sodium 137 135 - 145 mEq/L   Potassium 3.8 3.5 - 5.1 mEq/L   Chloride 103 96 - 112 mEq/L   CO2 27 19 - 32 mEq/L   Glucose, Bld 87 70 - 99 mg/dL   BUN 18 6 - 23 mg/dL   Creatinine, Ser 1.12 0.40 - 1.20 mg/dL   Total Bilirubin 0.5 0.2 - 1.2 mg/dL   Alkaline  Phosphatase 79 39 - 117 U/L   AST 14 0 - 37 U/L   ALT 9 0 - 35 U/L   Total Protein 7.7 6.0 - 8.3 g/dL   Albumin 4.1 3.5 - 5.2 g/dL   GFR 52.70 (L) >60.00 mL/min   Calcium 9.9 8.4 - 10.5 mg/dL  Lipid panel  Result Value Ref Range   Cholesterol 147 0 - 200 mg/dL   Triglycerides 145.0 0.0 - 149.0 mg/dL   HDL 42.50 >39.00 mg/dL   VLDL 29.0 0.0 - 40.0 mg/dL   LDL Cholesterol 76 0 - 99 mg/dL   Total CHOL/HDL Ratio 3    NonHDL 104.65   Urinalysis, Routine w reflex microscopic  Result Value Ref Range   Color, Urine YELLOW Yellow;Lt. Yellow;Straw;Dark Yellow;Amber;Green;Red;Brown   APPearance CLEAR Clear;Turbid;Slightly Cloudy;Cloudy   Specific Gravity, Urine 1.010 1.000 - 1.030   pH 6.0 5.0 - 8.0   Total Protein, Urine NEGATIVE Negative   Urine Glucose NEGATIVE Negative   Ketones, ur NEGATIVE Negative   Bilirubin Urine NEGATIVE Negative   Hgb urine dipstick NEGATIVE Negative   Urobilinogen, UA 0.2 0.0 - 1.0   Leukocytes,Ua NEGATIVE Negative   Nitrite NEGATIVE Negative   WBC, UA 0-2/hpf 0-2/hpf   RBC / HPF 0-2/hpf 0-2/hpf   Squamous Epithelial / LPF Rare(0-4/hpf) Rare(0-4/hpf)  TSH  Result Value Ref Range   TSH 1.83 0.35 - 5.50 uIU/mL  T4, free  Result Value Ref Range   Free T4 0.71 0.60 - 1.60 ng/dL  Vitamin B12  Result Value Ref Range   Vitamin B-12 766 211 - 911 pg/mL      The 10-year ASCVD risk score (Arnett DK, et al., 2019) is: 15.2%    Assessment & Plan:   Problem List Items Addressed This Visit       Cardiovascular and Mediastinum   Essential hypertension   Relevant Medications   potassium chloride (KLOR-CON M) 10 MEQ tablet   Other Relevant Orders   CBC   Comprehensive metabolic panel (Completed)   Urinalysis, Routine w reflex microscopic (Completed)     Endocrine   Hyperthyroidism   Relevant Orders   TSH (Completed)   T4, free (Completed)     Other   Hyperlipidemia   Relevant Orders   Comprehensive metabolic panel (Completed)   Lipid panel  (Completed)   Tobacco abuse   Healthcare maintenance - Primary  Elevated hemoglobin (HCC)   Relevant Orders   CBC   Gait disturbance   B12 deficiency   Relevant Orders   Vitamin B12 (Completed)    Return in about 6 months (around 04/14/2022).  Will continue to wean herself slowly off of the cigarettes.  Is walking up the nerve to go see the dentist about dentures.  Will monitor thyroid since her endocrinologist retired.  Recommended weight loss and exercise as tolerated. Consider recumbant bike and or swimming. Follow up with Gyn provider for routine well woman care. Advise flu vaccine next month.   Libby Maw, MD

## 2021-10-13 ENCOUNTER — Other Ambulatory Visit (INDEPENDENT_AMBULATORY_CARE_PROVIDER_SITE_OTHER): Payer: Medicare HMO

## 2021-10-13 DIAGNOSIS — D582 Other hemoglobinopathies: Secondary | ICD-10-CM

## 2021-10-13 DIAGNOSIS — I10 Essential (primary) hypertension: Secondary | ICD-10-CM

## 2021-10-13 LAB — CBC
HCT: 50.2 % — ABNORMAL HIGH (ref 36.0–46.0)
Hemoglobin: 16 g/dL — ABNORMAL HIGH (ref 12.0–15.0)
MCHC: 31.8 g/dL (ref 30.0–36.0)
MCV: 90.4 fl (ref 78.0–100.0)
Platelets: 231 10*3/uL (ref 150.0–400.0)
RBC: 5.56 Mil/uL — ABNORMAL HIGH (ref 3.87–5.11)
RDW: 17.3 % — ABNORMAL HIGH (ref 11.5–15.5)
WBC: 9.9 10*3/uL (ref 4.0–10.5)

## 2021-10-30 ENCOUNTER — Ambulatory Visit (HOSPITAL_COMMUNITY): Admission: RE | Admit: 2021-10-30 | Payer: Medicare HMO | Source: Ambulatory Visit

## 2021-11-03 NOTE — Addendum Note (Signed)
Addended by: Jon Billings on: 11/03/2021 07:57 AM   Modules accepted: Level of Service

## 2021-11-29 ENCOUNTER — Telehealth: Payer: Self-pay | Admitting: Family Medicine

## 2021-11-29 DIAGNOSIS — M25559 Pain in unspecified hip: Secondary | ICD-10-CM

## 2021-11-29 NOTE — Telephone Encounter (Signed)
Please advise message below, okay for referral?

## 2021-11-29 NOTE — Telephone Encounter (Signed)
Pt is wanting a referral to Dr. Louanne Skye for L hip and R knee pain. She said that you two had discussed this in a past appointment. Pt @ [phone].

## 2021-12-20 ENCOUNTER — Ambulatory Visit: Payer: Medicare HMO | Admitting: Specialist

## 2021-12-28 ENCOUNTER — Ambulatory Visit (INDEPENDENT_AMBULATORY_CARE_PROVIDER_SITE_OTHER): Payer: Medicare HMO

## 2021-12-28 ENCOUNTER — Encounter: Payer: Self-pay | Admitting: Specialist

## 2021-12-28 ENCOUNTER — Ambulatory Visit: Payer: Medicare HMO | Admitting: Specialist

## 2021-12-28 VITALS — BP 110/72 | HR 70 | Ht 66.5 in | Wt 252.0 lb

## 2021-12-28 DIAGNOSIS — M1711 Unilateral primary osteoarthritis, right knee: Secondary | ICD-10-CM

## 2021-12-28 DIAGNOSIS — G8929 Other chronic pain: Secondary | ICD-10-CM

## 2021-12-28 DIAGNOSIS — M25552 Pain in left hip: Secondary | ICD-10-CM

## 2021-12-28 DIAGNOSIS — M4316 Spondylolisthesis, lumbar region: Secondary | ICD-10-CM | POA: Diagnosis not present

## 2021-12-28 DIAGNOSIS — M25561 Pain in right knee: Secondary | ICD-10-CM

## 2021-12-28 DIAGNOSIS — M5136 Other intervertebral disc degeneration, lumbar region: Secondary | ICD-10-CM | POA: Diagnosis not present

## 2021-12-28 DIAGNOSIS — Z981 Arthrodesis status: Secondary | ICD-10-CM

## 2021-12-28 MED ORDER — DULOXETINE HCL 20 MG PO CPEP: 20.0000 mg | ORAL_CAPSULE | Freq: Every day | ORAL | 3 refills | Status: DC

## 2021-12-28 NOTE — Patient Instructions (Addendum)
Plan: Avoid bending, stooping and avoid lifting weights greater than 10 lbs. Avoid prolong standing and walking. Avoid frequent bending and stooping  No lifting greater than 10 lbs. May use ice or moist heat for pain. Weight loss is of benefit. Handicap license is approved. Right knee and left hip arthritis likely will need to be delt with I will refer you to Dr. Ninfa Linden to consider left total hip replacement and right Total knee replacement. Return with Dr. Laurance Flatten for follow up of spondylolisthesis above previous L4-S1 fusion. I believe hip and knee are more limiting in terms of pain And discomfort. Once these are fixed I believe your standing and walking will improve but then the spine condition may be the limiting factor in the distance you will be able to walk before having to sit or stoop to improve leg strength and cramping or claudication.   BMI is 40.3, need to lose at least 5 lbs to bring below 40 to consider surgery of the hip and knee.   Try duloxetine (cymbalta) for arthritis pain as your kidney function appears slightly decreased by 10% at last blood work 08/2021.  Knee is suffering from osteoarthritis, only real proven treatments are Weight loss, you can not take NSIADs like diclofenac or motrin or naprosyn.  Exercise has been shown to help decrease stiffness and improve function. Well padded shoes help. Ice the knee the knee that is suffering from osteoarthritis.  Ice the knee 2-3 times a day 15-20 mins at a time 3 times a day 15-20 mins at a time. Hot showers in the AM.  Injection with steroid may be of benefit. Hemp CBD capsules, amazon.com 5,000-7,000 mg per bottle, 60 capsules per bottle, take one capsule twice a day. Cane in the left hand to use with left leg weight bearing. Follow-Up Instructions: No follow-ups on file.

## 2021-12-28 NOTE — Progress Notes (Signed)
Office Visit Note   Patient: Kathy Herrera           Date of Birth: Sep 13, 1960           MRN: [id number] Visit Date: 12/28/2021              Requested by: Libby Maw, MD [address] [place],  [place] [address] PCP: Libby Maw, MD   Assessment & Plan: Visit Diagnoses:  1. Pain in left hip   2. Chronic pain of right knee   3. Unilateral primary osteoarthritis, right knee   4. Spondylolisthesis of lumbar region   5. Lumbar degenerative disc disease   6. History of lumbar spinal fusion     Plan: Avoid bending, stooping and avoid lifting weights greater than 10 lbs. Avoid prolong standing and walking. Avoid frequent bending and stooping  No lifting greater than 10 lbs. May use ice or moist heat for pain. Weight loss is of benefit. Handicap license is approved. Right knee and left hip arthritis likely will need to be delt with I will refer you to Dr. Ninfa Linden to consider left total hip replacement and right Total knee replacement. Return with Dr. Laurance Flatten for follow up of spondylolisthesis above previous L4-S1 fusion. I believe hip and knee are more limiting in terms of pain And discomfort. Once these are fixed I believe your standing and walking will improve but then the spine condition may be the limiting factor in the distance you will be able to walk before having to sit or stoop to improve leg strength and cramping or claudication.  Try duloxetine (cymbalta) for arthritis pain as your kidney function appears slightly decreased by 10% at last blood work 08/2021.  Knee is suffering from osteoarthritis, only real proven treatments are Weight loss, you can not take NSIADs like diclofenac or motrin or naprosyn.  Exercise has been shown to help decrease stiffness and improve function. Well padded shoes help. Ice the knee the knee that is suffering from osteoarthritis.  Ice the knee 2-3 times a day 15-20 mins at a time 3 times a day 15-20 mins at a time.  Hot showers in the AM.  Injection with steroid may be of benefit. Hemp CBD capsules, amazon.com 5,000-7,000 mg per bottle, 60 capsules per bottle, take one capsule twice a day. Cane in the left hand to use with left leg weight bearing. Follow-Up Instructions: No follow-ups on file.    Follow-Up Instructions: No follow-ups on file.   Orders:  Orders Placed This Encounter  Procedures   XR HIP UNILAT W OR W/O PELVIS 2-3 VIEWS LEFT   XR Knee 1-2 Views Right   No orders of the defined types were placed in this encounter.     Procedures: No procedures performed   Clinical Data: No additional findings.   Subjective: Chief Complaint  Patient presents with   Left Hip - Pain   Right Knee - Pain    61 year old female with history of lumbar fusion for spondylolisthesis and spinal stenosis, also being followed for left hip osteoarthritis and right knee osteoarthritis. She notes that she is having severe left anterior groin pain. Pain that is worse with starting to stand and walk. There is difficulty with stairs and has to lift her leg to reach the left shoe and sock. No difficulty reaching foot for bathing. Using a walker for ambulation but can't use in the back of the house. Does have to lean on items couch and  things as she is walking. The right knee tends to buckle bad.    Review of Systems  Constitutional: Negative.   HENT: Negative.    Eyes: Negative.   Respiratory: Negative.    Cardiovascular: Negative.   Gastrointestinal: Negative.   Endocrine: Negative.   Genitourinary: Negative.   Musculoskeletal: Negative.   Skin: Negative.   Allergic/Immunologic: Negative.   Neurological: Negative.   Hematological: Negative.   Psychiatric/Behavioral: Negative.       Objective: Vital Signs: BP 110/72   Pulse 70   Ht 5' 6.5" (1.689 m)   Wt 252 lb (114.3 kg)   BMI 40.06 kg/m   Physical Exam Constitutional:      Appearance: She is well-developed.  HENT:     Head:  Normocephalic and atraumatic.  Eyes:     Pupils: Pupils are equal, round, and reactive to light.  Pulmonary:     Effort: Pulmonary effort is normal.     Breath sounds: Normal breath sounds.  Abdominal:     General: Bowel sounds are normal.     Palpations: Abdomen is soft.  Musculoskeletal:     Cervical back: Normal range of motion and neck supple.     Lumbar back: Negative right straight leg raise test and negative left straight leg raise test.  Skin:    General: Skin is warm and dry.  Neurological:     Mental Status: She is alert and oriented to person, place, and time.  Psychiatric:        Behavior: Behavior normal.        Thought Content: Thought content normal.        Judgment: Judgment normal.     Back Exam   Tenderness  The patient is experiencing tenderness in the lumbar.  Range of Motion  Extension:  abnormal  Flexion:  abnormal  Lateral bend right:  abnormal  Lateral bend left:  abnormal  Rotation right:  abnormal  Rotation left:  abnormal   Muscle Strength  Right Quadriceps:  5/5  Left Quadriceps:  5/5  Right Hamstrings:  5/5  Left Hamstrings:  5/5   Tests  Straight leg raise right: negative Straight leg raise left: negative  Reflexes  Patellar:  2/4 Achilles:  2/4 Babinski's sign: normal   Other  Toe walk: normal Heel walk: normal Erythema: no back redness Scars: absent  Comments:  Motor is intact There is painful ROM left hip with catch and severe pain with IR, ER is good.  Radiographs of the left hip with lateral head uncovered with DJD narrowing especially superolateral with head elevated with the acetabulum and superolateralsubchondral sclerosis. Right hip joint line is normal. Right knee with loss or full extension and flexion.      Specialty Comments:  No specialty comments available.  Imaging: No results found.   PMFS History: Patient Active Problem List   Diagnosis Date Noted   Spinal stenosis, lumbar region, with  neurogenic claudication 02/02/2014    Priority: High    Class: Chronic   Spondylolisthesis of lumbar region 02/02/2014    Priority: High    Class: Chronic   Lumbar degenerative disc disease 02/02/2014    Priority: Medium     Class: Chronic   Gait disturbance 10/12/2021   B12 deficiency 10/12/2021   Need for influenza vaccination 04/03/2021   Tobacco abuse counseling 09/30/2020   Abdominal aortic aneurysm (AAA) ([facility]) 06/24/2020   Personal history of colonic polyps 06/01/2020   Benign neoplasm of colon 06/01/2020   Aortic aneurysm (  [facility]) 06/01/2020   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[facility]) 06/01/2020   Arthritis of left hip 72/53/6644   LSC (lichen simplex chronicus) 09/11/2019   Vitamin D deficiency 12/28/2018   Elevated hemoglobin ([facility]) 10/29/2018   Crohn's disease of ileum, unspecified complication ([facility]) 03/47/4259   ASCVD (arteriosclerotic cardiovascular disease) 10/28/2018   Healthcare maintenance 08/21/2016   Enterocolitis 07/14/2015   Perforated viscus 07/14/2015   Hyperthyroidism 12/23/2014   Morbid obesity ([facility]) 02/11/2013   Tobacco abuse 09/17/2012   Essential hypertension    Hyperlipidemia    Past Medical History:  Diagnosis Date   AAA (abdominal aortic aneurysm) (HCC)    Crohn disease ([facility])    Depression    History. No current problems   DJD (degenerative joint disease) of lumbar spine    Dyspnea    With Activity   GERD (gastroesophageal reflux disease)    no current problems   Heart murmur    patient denies, states she was "diagnosed in [place], no follow up here"   Heart murmur    no current problems   Hyperlipidemia    Hypertension    Hyperthyroidism    Lactose intolerance    Perforated viscus 07/2015   Rheumatoid arthritis ([facility])    STD (sexually transmitted disease)    Condyloma   Thyroid disorder    Vitamin D deficiency     Family History  Problem Relation Age of Onset   Cancer Mother 34       cervical   CVA Mother    Hypertension  Mother    Diabetes Mother    Obesity Mother    High Cholesterol Mother    Thyroid disease Daughter    Diabetes Daughter    Breast cancer Neg Hx     Past Surgical History:  Procedure Laterality Date   ABDOMINAL AORTIC ENDOVASCULAR STENT GRAFT Bilateral 06/24/2020   Procedure: ABDOMINAL AORTIC ENDOVASCULAR STENT GRAFT;  Surgeon: Cherre Robins, MD;  Location: [facility] OR;  Service: Vascular;  Laterality: Bilateral;   ABDOMINAL HYSTERECTOMY     partial- still has cervix.   arm surgery Left    CHOLECYSTECTOMY     COLONOSCOPY     EMBOLIZATION Right 06/24/2020   Procedure: COIL EMBOLIZATION RIGHT INTERNAL ILIAC ARTERY;  Surgeon: Cherre Robins, MD;  Location: [facility];  Service: Vascular;  Laterality: Right;   LUMBAR FUSION N/A 02/02/2014   Procedure: CENTRAL LAMINECTOMY L4-5,L5-S1, TRANSFORAMINAL LUMBAR INTERBODY FUSION L4-5,L5-S1, RODS AND SCREW FIX  AND L5-S1, LOCAL BONE GRAFT;  Surgeon: Jessy Oto, MD;  Location: [facility];  Service: Orthopedics;  Laterality: N/A;   TONSILLECTOMY AND ADENOIDECTOMY     ULTRASOUND GUIDANCE FOR VASCULAR ACCESS Bilateral 06/24/2020   Procedure: ULTRASOUND GUIDANCE FOR VASCULAR ACCESS;  Surgeon: Cherre Robins, MD;  Location[facility] OR;  Service: Vascular;  Laterality: Bilateral;   Social History   Occupational History   Occupation: Retired Art therapist  Tobacco Use   Smoking status: Every Day    Packs/day: 1.00    Years: 50.00    Total pack years: 50.00    Types: Cigars, Cigarettes    Start date: 05/27/1968    Passive exposure: Never   Smokeless tobacco: Never   Tobacco comments:    Quit smoking 2 weeks ago  Vaping Use   Vaping Use: Never used  Substance and Sexual Activity   Alcohol use: Yes    Comment: occassional -  holidays   Drug use: No   Sexual activity: Not Currently  Birth control/protection: Surgical    Comment: 1st intercourse less than15 yo-Fewer than 5 partners

## 2022-01-15 ENCOUNTER — Encounter: Payer: Self-pay | Admitting: Orthopaedic Surgery

## 2022-01-15 ENCOUNTER — Ambulatory Visit (INDEPENDENT_AMBULATORY_CARE_PROVIDER_SITE_OTHER): Payer: Medicare HMO | Admitting: Orthopaedic Surgery

## 2022-01-15 VITALS — Ht 66.5 in | Wt 247.0 lb

## 2022-01-15 DIAGNOSIS — M25552 Pain in left hip: Secondary | ICD-10-CM | POA: Diagnosis not present

## 2022-01-15 DIAGNOSIS — M1612 Unilateral primary osteoarthritis, left hip: Secondary | ICD-10-CM

## 2022-01-15 NOTE — Progress Notes (Signed)
The patient is a 61 year old female referred from my partner Dr. Louanne Skye to evaluate and treat severe end-stage arthritis of her left hip.  She does ambulate with a rolling walker.  She is a smoker but not a diabetic.  Her BMI is down to 39.27 today.  Her left hip pain has been getting worse for over a year now.  She has tried and failed conservative treatment including even an intra-articular steroid injection which helped for few days but then wore off.  She is also been dealing with right knee pain and the right knee gives way.  She has x-rays showing severe end-stage arthritis of the left hip but also the right knee.  She currently denies any headache, chest pain, shortness of breath, fever, chills, nausea, vomiting.  I was able to review all of her records and medications as well as medical history within epic.  Her right hip moves smoothly and fluidly and normal with no pain.  Her left hip has significant pain with any attempts of internal and external rotation as well as significant stiffness with rotation.  Her right knee also has some pain throughout arc of motion.  With mainly lateral joint line tenderness and patellofemoral crepitation.  An AP pelvis lateral the right hip shows severe end-stage arthritis of the left hip.  There is bone-on-bone wear of that left hip with complete loss of joint space and para-articular osteophytes as well as sclerotic and cystic changes.  The right hip appears normal.  The right knee does have some slight valgus malalignment with joint space narrowing in all 3 compartments as well as osteophytes.  I feel that replacing her left hip first would be the key surgery for her for now given the severity arthritis of her left hip combined with clinical exam and x-ray findings.  I went over hip replacement surgery with her in detail.  Described the risk and benefits of the surgery.  She needs to keep her weight down below 40 BMI.  I was able to assess her soft tissue planes  for getting to her hip.  I gave her handout about hip replacement surgery and went over in detail what to expect from an intraoperative and postoperative course.  All questions and concerns were answered and addressed.  We will work on getting this scheduled in the near future.

## 2022-02-05 ENCOUNTER — Other Ambulatory Visit: Payer: Self-pay

## 2022-02-12 ENCOUNTER — Ambulatory Visit (INDEPENDENT_AMBULATORY_CARE_PROVIDER_SITE_OTHER): Payer: Medicare HMO

## 2022-02-12 VITALS — Ht 66.5 in | Wt 241.0 lb

## 2022-02-12 DIAGNOSIS — Z0000 Encounter for general adult medical examination without abnormal findings: Secondary | ICD-10-CM | POA: Diagnosis not present
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
## 2022-02-12 NOTE — Patient Instructions (Addendum)
Kathy Herrera , Thank you for taking time to come for your Medicare Wellness Visit. I appreciate your ongoing commitment to your health goals. Please review the following plan we discussed and let me know if I can assist you in the future.   These are the goals we discussed:  Goals      Increase water intake     Starting 08/21/2016, I will continue to drink at least 6-8 glasses of water daily.      Patient Stated     02/12/2022, wants to walk better     Quit Smoking     Start going to water aerobics once per week.         This is a list of the screening recommended for you and due dates:  Health Maintenance  Topic Date Due   DTaP/Tdap/Td vaccine (1 - Tdap) Never done   Flu Shot  10/03/2021   Screening for Lung Cancer  10/28/2021   Mammogram  03/17/2022   Pap Smear  10/18/2022   Medicare Annual Wellness Visit  02/13/2023   Colon Cancer Screening  02/16/2029   Hepatitis C Screening: USPSTF Recommendation to screen - Ages 18-79 yo.  Completed   HIV Screening  Completed   HPV Vaccine  Aged Out   COVID-19 Vaccine  Discontinued   Zoster (Shingles) Vaccine  Discontinued    Advanced directives: Advance directive discussed with you today. Even though you declined this today please call our office should you change your mind and we can give you the proper paperwork for you to fill out.  Conditions/risks identified: smoking  Next appointment: Follow up in one year for your annual wellness visit.   Preventive Care 40-64 Years, Female Preventive care refers to lifestyle choices and visits with your health care provider that can promote health and wellness. What does preventive care include? A yearly physical exam. This is also called an annual well check. Dental exams once or twice a year. Routine eye exams. Ask your health care provider how often you should have your eyes checked. Personal lifestyle choices, including: Daily care of your teeth and gums. Regular physical  activity. Eating a healthy diet. Avoiding tobacco and drug use. Limiting alcohol use. Practicing safe sex. Taking low-dose aspirin daily starting at age 37. Taking vitamin and mineral supplements as recommended by your health care provider. What happens during an annual well check? The services and screenings done by your health care provider during your annual well check will depend on your age, overall health, lifestyle risk factors, and family history of disease. Counseling  Your health care provider may ask you questions about your: Alcohol use. Tobacco use. Drug use. Emotional well-being. Home and relationship well-being. Sexual activity. Eating habits. Work and work Statistician. Method of birth control. Menstrual cycle. Pregnancy history. Screening  You may have the following tests or measurements: Height, weight, and BMI. Blood pressure. Lipid and cholesterol levels. These may be checked every 5 years, or more frequently if you are over 63 years old. Skin check. Lung cancer screening. You may have this screening every year starting at age 41 if you have a 30-pack-year history of smoking and currently smoke or have quit within the past 15 years. Fecal occult blood test (FOBT) of the stool. You may have this test every year starting at age 89. Flexible sigmoidoscopy or colonoscopy. You may have a sigmoidoscopy every 5 years or a colonoscopy every 10 years starting at age 25. Hepatitis C blood test. Hepatitis B blood  test. Sexually transmitted disease (STD) testing. Diabetes screening. This is done by checking your blood sugar (glucose) after you have not eaten for a while (fasting). You may have this done every 1-3 years. Mammogram. This may be done every 1-2 years. Talk to your health care provider about when you should start having regular mammograms. This may depend on whether you have a family history of breast cancer. BRCA-related cancer screening. This may be done if  you have a family history of breast, ovarian, tubal, or peritoneal cancers. Pelvic exam and Pap test. This may be done every 3 years starting at age 21. Starting at age 30, this may be done every 5 years if you have a Pap test in combination with an HPV test. Bone density scan. This is done to screen for osteoporosis. You may have this scan if you are at high risk for osteoporosis. Discuss your test results, treatment options, and if necessary, the need for more tests with your health care provider. Vaccines  Your health care provider may recommend certain vaccines, such as: Influenza vaccine. This is recommended every year. Tetanus, diphtheria, and acellular pertussis (Tdap, Td) vaccine. You may need a Td booster every 10 years. Zoster vaccine. You may need this after age 60. Pneumococcal 13-valent conjugate (PCV13) vaccine. You may need this if you have certain conditions and were not previously vaccinated. Pneumococcal polysaccharide (PPSV23) vaccine. You may need one or two doses if you smoke cigarettes or if you have certain conditions. Talk to your health care provider about which screenings and vaccines you need and how often you need them. This information is not intended to replace advice given to you by your health care provider. Make sure you discuss any questions you have with your health care provider. Document Released: 03/18/2015 Document Revised: 11/09/2015 Document Reviewed: 12/21/2014 Elsevier Interactive Patient Education  2017 [employer]    Fall Prevention in the Home Falls can cause injuries. They can happen to people of all ages. There are many things you can do to make your home safe and to help prevent falls. What can I do on the outside of my home? Regularly fix the edges of walkways and driveways and fix any cracks. Remove anything that might make you trip as you walk through a door, such as a raised step or threshold. Trim any bushes or trees on the path to your  home. Use bright outdoor lighting. Clear any walking paths of anything that might make someone trip, such as rocks or tools. Regularly check to see if handrails are loose or broken. Make sure that both sides of any steps have handrails. Any raised decks and porches should have guardrails on the edges. Have any leaves, snow, or ice cleared regularly. Use sand or salt on walking paths during winter. Clean up any spills in your garage right away. This includes oil or grease spills. What can I do in the bathroom? Use night lights. Install grab bars by the toilet and in the tub and shower. Do not use towel bars as grab bars. Use non-skid mats or decals in the tub or shower. If you need to sit down in the shower, use a plastic, non-slip stool. Keep the floor dry. Clean up any water that spills on the floor as soon as it happens. Remove soap buildup in the tub or shower regularly. Attach bath mats securely with double-sided non-slip rug tape. Do not have throw rugs and other things on the floor that can make you   trip. What can I do in the bedroom? Use night lights. Make sure that you have a light by your bed that is easy to reach. Do not use any sheets or blankets that are too big for your bed. They should not hang down onto the floor. Have a firm chair that has side arms. You can use this for support while you get dressed. Do not have throw rugs and other things on the floor that can make you trip. What can I do in the kitchen? Clean up any spills right away. Avoid walking on wet floors. Keep items that you use a lot in easy-to-reach places. If you need to reach something above you, use a strong step stool that has a grab bar. Keep electrical cords out of the way. Do not use floor polish or wax that makes floors slippery. If you must use wax, use non-skid floor wax. Do not have throw rugs and other things on the floor that can make you trip. What can I do with my stairs? Do not leave any  items on the stairs. Make sure that there are handrails on both sides of the stairs and use them. Fix handrails that are broken or loose. Make sure that handrails are as long as the stairways. Check any carpeting to make sure that it is firmly attached to the stairs. Fix any carpet that is loose or worn. Avoid having throw rugs at the top or bottom of the stairs. If you do have throw rugs, attach them to the floor with carpet tape. Make sure that you have a light switch at the top of the stairs and the bottom of the stairs. If you do not have them, ask someone to add them for you. What else can I do to help prevent falls? Wear shoes that: Do not have high heels. Have rubber bottoms. Are comfortable and fit you well. Are closed at the toe. Do not wear sandals. If you use a stepladder: Make sure that it is fully opened. Do not climb a closed stepladder. Make sure that both sides of the stepladder are locked into place. Ask someone to hold it for you, if possible. Clearly mark and make sure that you can see: Any grab bars or handrails. First and last steps. Where the edge of each step is. Use tools that help you move around (mobility aids) if they are needed. These include: Canes. Walkers. Scooters. Crutches. Turn on the lights when you go into a dark area. Replace any light bulbs as soon as they burn out. Set up your furniture so you have a clear path. Avoid moving your furniture around. If any of your floors are uneven, fix them. If there are any pets around you, be aware of where they are. Review your medicines with your doctor. Some medicines can make you feel dizzy. This can increase your chance of falling. Ask your doctor what other things that you can do to help prevent falls. This information is not intended to replace advice given to you by your health care provider. Make sure you discuss any questions you have with your health care provider. Document Released: 12/16/2008  Document Revised: 07/28/2015 Document Reviewed: 03/26/2014 Elsevier Interactive Patient Education  2017 [employer].

## 2022-02-14 ENCOUNTER — Other Ambulatory Visit: Payer: Self-pay | Admitting: Physician Assistant

## 2022-02-14 DIAGNOSIS — Z01818 Encounter for other preprocedural examination: Secondary | ICD-10-CM

## 2022-02-19 NOTE — Pre-Procedure Instructions (Signed)
Surgical Instructions    Your procedure is scheduled on February 27, 2022.  Report to [facility] Main Entrance "A" at 9:30 A.M., then check in with the Admitting office.  Call this number if you have problems the morning of surgery:  [phone]   If you have any questions prior to your surgery date call [phone]: Open Monday-Friday 8am-4pm    Remember:  Do not eat after midnight the night before your surgery  You may drink clear liquids until 8:30 AM the morning of your surgery.   Clear liquids allowed are: Water, Non-Citrus Juices (without pulp), Carbonated Beverages, Clear Tea, Black Coffee Only (NO MILK, CREAM OR POWDERED CREAMER of any kind), and Gatorade.  Patient Instructions  The night before surgery:  No food after midnight. ONLY clear liquids after midnight  The day of surgery (if you do NOT have diabetes):  Drink ONE (1) Pre-Surgery Clear Ensure by 8:30 AM the morning of surgery. Drink in one sitting. Do not sip.  This drink was given to you during your hospital  pre-op appointment visit.  Nothing else to drink after completing the  Pre-Surgery Clear Ensure.         If you have questions, please contact your surgeon's office.     Take these medicines the morning of surgery with A SIP OF WATER:  atenolol (TENORMIN)   rosuvastatin (CRESTOR)     May take if needed:  acetaminophen (TYLENOL)   albuterol (VENTOLIN HFA) inhaler   DULoxetine (CYMBALTA)    Follow your surgeon's instructions on when to stop Aspirin.  If no instructions were given by your surgeon then you will need to call the office to get those instructions.     As of today, STOP taking any Aleve, Naproxen, Ibuprofen, Motrin, Advil, Goody's, BC's, all herbal medications, fish oil, and all vitamins. This includes your medication: diclofenac Sodium (VOLTAREN) GEL                      Do NOT Smoke (Tobacco/Vaping) for 24 hours prior to your procedure.  If you use a CPAP at night, you may bring  your mask/headgear for your overnight stay.   Contacts, glasses, piercing's, hearing aid's, dentures or partials may not be worn into surgery, please bring cases for these belongings.    For patients admitted to the hospital, discharge time will be determined by your treatment team.   Patients discharged the day of surgery will not be allowed to drive home, and someone needs to stay with them for 24 hours.  SURGICAL WAITING ROOM VISITATION Patients having surgery or a procedure may have no more than 2 support people in the waiting area - these visitors may rotate.   Children under the age of 26 must have an adult with them who is not the patient. If the patient needs to stay at the hospital during part of their recovery, the visitor guidelines for inpatient rooms apply. Pre-op nurse will coordinate an appropriate time for 1 support person to accompany patient in pre-op.  This support person may not rotate.   Please refer to the [facility] website for the visitor guidelines for Inpatients (after your surgery is over and you are in a regular room).    Special instructions:   [place]- Preparing For Surgery  Before surgery, you can play an important role. Because skin is not sterile, your skin needs to be as free of germs as possible. You can reduce the number of germs on  your skin by washing with CHG (chlorahexidine gluconate) Soap before surgery.  CHG is an antiseptic cleaner which kills germs and bonds with the skin to continue killing germs even after washing.    Oral Hygiene is also important to reduce your risk of infection.  Remember - BRUSH YOUR TEETH THE MORNING OF SURGERY WITH YOUR REGULAR TOOTHPASTE  Please do not use if you have an allergy to CHG or antibacterial soaps. If your skin becomes reddened/irritated stop using the CHG.  Do not shave (including legs and underarms) for at least 48 hours prior to first CHG shower. It is OK to shave your face.  Please follow these  instructions carefully.   Shower the NIGHT BEFORE SURGERY and the MORNING OF SURGERY  If you chose to wash your hair, wash your hair first as usual with your normal shampoo.  After you shampoo, rinse your hair and body thoroughly to remove the shampoo.  Use CHG Soap as you would any other liquid soap. You can apply CHG directly to the skin and wash gently with a scrungie or a clean washcloth.   Apply the CHG Soap to your body ONLY FROM THE NECK DOWN.  Do not use on open wounds or open sores. Avoid contact with your eyes, ears, mouth and genitals (private parts). Wash Face and genitals (private parts)  with your normal soap.   Wash thoroughly, paying special attention to the area where your surgery will be performed.  Thoroughly rinse your body with warm water from the neck down.  DO NOT shower/wash with your normal soap after using and rinsing off the CHG Soap.  Pat yourself dry with a CLEAN TOWEL.  Wear CLEAN PAJAMAS to bed the night before surgery  Place CLEAN SHEETS on your bed the night before your surgery  DO NOT SLEEP WITH PETS.   Day of Surgery: Take a shower with CHG soap. Do not wear jewelry or makeup Do not wear lotions, powders, perfumes/colognes, or deodorant. Do not shave 48 hours prior to surgery.  Men may shave face and neck. Do not bring valuables to the hospital.  [facility] is not responsible for any belongings or valuables. Do not wear nail polish, gel polish, artificial nails, or any other type of covering on natural nails (fingers and toes) If you have artificial nails or gel coating that need to be removed by a nail salon, please have this removed prior to surgery. Artificial nails or gel coating may interfere with anesthesia's ability to adequately monitor your vital signs.  Wear Clean/Comfortable clothing the morning of surgery Remember to brush your teeth WITH YOUR REGULAR TOOTHPASTE.   Please read over the following fact sheets that you were  given.    If you received a COVID test during your pre-op visit  it is requested that you wear a mask when out in public, stay away from anyone that may not be feeling well and notify your surgeon if you develop symptoms. If you have been in contact with anyone that has tested positive in the last 10 days please notify you surgeon.

## 2022-02-20 ENCOUNTER — Other Ambulatory Visit: Payer: Self-pay

## 2022-02-20 ENCOUNTER — Encounter (HOSPITAL_COMMUNITY): Payer: Self-pay

## 2022-02-20 ENCOUNTER — Encounter (HOSPITAL_COMMUNITY)
Admission: RE | Admit: 2022-02-20 | Discharge: 2022-02-20 | Disposition: A | Discharge status: Home / Self Care | Payer: Medicare HMO | Source: Ambulatory Visit | Attending: Orthopaedic Surgery | Admitting: Orthopaedic Surgery

## 2022-02-20 VITALS — BP 152/77 | HR 69 | Temp 98.1°F | Resp 18 | Ht 66.0 in | Wt 245.1 lb

## 2022-02-20 DIAGNOSIS — I358 Other nonrheumatic aortic valve disorders: Secondary | ICD-10-CM | POA: Diagnosis not present

## 2022-02-20 DIAGNOSIS — I517 Cardiomegaly: Secondary | ICD-10-CM | POA: Diagnosis not present

## 2022-02-20 DIAGNOSIS — I714 Abdominal aortic aneurysm, without rupture, unspecified: Secondary | ICD-10-CM | POA: Insufficient documentation

## 2022-02-20 DIAGNOSIS — Z01818 Encounter for other preprocedural examination: Secondary | ICD-10-CM | POA: Diagnosis not present

## 2022-02-20 DIAGNOSIS — I4510 Unspecified right bundle-branch block: Secondary | ICD-10-CM | POA: Insufficient documentation

## 2022-02-20 HISTORY — DX: Peripheral vascular disease, unspecified: I73.9

## 2022-02-20 LAB — COMPREHENSIVE METABOLIC PANEL
ALT: 12 U/L (ref 0–44)
AST: 17 U/L (ref 15–41)
Albumin: 3.5 g/dL (ref 3.5–5.0)
Alkaline Phosphatase: 65 U/L (ref 38–126)
Anion gap: 5 (ref 5–15)
BUN: 10 mg/dL (ref 8–23)
CO2: 26 mmol/L (ref 22–32)
Calcium: 10 mg/dL (ref 8.9–10.3)
Chloride: 108 mmol/L (ref 98–111)
Creatinine, Ser: 1.1 mg/dL — ABNORMAL HIGH (ref 0.44–1.00)
GFR, Estimated: 57 mL/min — ABNORMAL LOW (ref >60)
Glucose, Bld: 103 mg/dL — ABNORMAL HIGH (ref 70–99)
Potassium: 3.8 mmol/L (ref 3.5–5.1)
Sodium: 139 mmol/L (ref 135–145)
Total Bilirubin: 0.6 mg/dL (ref 0.3–1.2)
Total Protein: 7.3 g/dL (ref 6.5–8.1)

## 2022-02-20 LAB — TYPE AND SCREEN
ABO/RH(D): AB POS
Antibody Screen: NEGATIVE

## 2022-02-20 LAB — CBC
HCT: 46.6 % — ABNORMAL HIGH (ref 36.0–46.0)
Hemoglobin: 15 g/dL (ref 12.0–15.0)
MCH: 28.5 pg (ref 26.0–34.0)
MCHC: 32.2 g/dL (ref 30.0–36.0)
MCV: 88.4 fL (ref 80.0–100.0)
Platelets: 216 10*3/uL (ref 150–400)
RBC: 5.27 MIL/uL — ABNORMAL HIGH (ref 3.87–5.11)
RDW: 15.3 % (ref 11.5–15.5)
WBC: 8.1 10*3/uL (ref 4.0–10.5)
nRBC: 0 % (ref 0.0–0.2)

## 2022-02-20 LAB — SURGICAL PCR SCREEN
MRSA, PCR: NEGATIVE
Staphylococcus aureus: NEGATIVE

## 2022-02-20 NOTE — Progress Notes (Signed)
PCP - Dr. Libby Maw Cardiologist - Dr. Fransico Him   PPM/ICD - Denies Device Orders - n/a Rep Notified - n/a  Chest x-ray - n/a EKG - 02/20/2022 Stress Test - Denies ECHO - 01/07/2013 Cardiac Cath - Denies  Sleep Study - Denies CPAP - n/a  No DM  Last dose of GLP1 agonist- n/a GLP1 instructions: n/a  Blood Thinner Instructions: n/a Aspirin Instructions: Pt instructed to call Dr. Trevor Mace office and inquire about how long/if to hold ASA. Once she gets information, she is to contact her PCP and notify them that she is holding medication. Pt understood instructions.  ERAS Protcol - Clear liquids until 0830 morning of surgery PRE-SURGERY Ensure or G2- Ensure given to pt at PAT appointment  COVID TEST- n/a   Anesthesia review: Yes. Abnormal EKG   Patient denies shortness of breath, fever, cough and chest pain at PAT appointment   All instructions explained to the patient, with a verbal understanding of the material. Patient agrees to go over the instructions while at home for a better understanding. Patient also instructed to self quarantine after being tested for COVID-19. The opportunity to ask questions was provided.

## 2022-02-21 ENCOUNTER — Telehealth: Payer: Self-pay | Admitting: Orthopaedic Surgery

## 2022-02-21 NOTE — Progress Notes (Signed)
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 Last seen by Dr. Stanford Breed 08/29/21. 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 Ectasia of LIIA noted, and will need to be monitored with q5y CTA (2027). Follow up in 1 year with EVAR duplex. If this is limited, will check CT scan."   Pt is not followed routinely by cardiology, but was evaluated by Dr. NAcie Fredricksonin 2014 for preoperative evaluation due to abnormal EKG (T wave inversion in lateral leads, felt consistent with repolarization abnormality). Echo showed mild LVH with normal EF, grade 1 diastolic dysfunction. She was cleared for back surgery at that time.    Proep labs reviewed, unremarkable.   EKG 02/20/22: Normal sinus rhythm, Rate 61. Left axis deviation. Incomplete right bundle branch block. Nonspecific T wave abnormality   CT Chest lung cancer screening 10/28/2020: IMPRESSION: Lung-RADS 2, benign appearance or behavior. Continue annual screening with low-dose chest CT without contrast in 12 months.   Echo 01/07/13: Study Conclusions  - Left ventricle: The cavity size was normal. Wall thickness    was increased in a pattern of mild LVH. Systolic function    was vigorous. The estimated ejection fraction was in the    range of 65% to 70%. Wall motion was normal; there were no    regional wall motion abnormalities. Doppler parameters are    consistent with abnormal left ventricular relaxation    (grade 1 diastolic dysfunction).  - Left atrium: The atrium was mildly dilated.     JWynonia MustyMNorth State Surgery Centers LP Dba Ct St Surgery CenterShort Stay Center/Anesthesiology Phone ([phone]/20/2023 1:35 PM

## 2022-02-21 NOTE — Telephone Encounter (Signed)
Patient called  asked if she can get a raised toilet seat and a walker? 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 Patient also asked when should she stop taking aspirin? The number to contact patient is [phone]

## 2022-02-21 NOTE — Telephone Encounter (Signed)
Patient aware case manager at the hospital will help her with this

## 2022-02-22 ENCOUNTER — Telehealth: Payer: Self-pay | Admitting: *Deleted

## 2022-02-22 NOTE — Care Plan (Signed)
[facility] RNCM call to patient to discuss her upcoming Left total hip arthroplasty with Dr. Ninfa Linden on 02/27/22. She is an Ortho bundle patient through [facility] and is agreeable to case management. She lives alone, but has a daughter and daughter in law that will be helping after surgery. She has a rollator, but will need a RW and 3in1/BSC. She may obtain these on her own before surgery as she is aware insurance will most likely not cover due to obtaining rollator within last 5 years. Anticipate HHPT will be needed after short hospital stay. Referral made to [facility] after choice provided. Reviewed all post op care instructions. Will continue to follow for needs.

## 2022-02-22 NOTE — Telephone Encounter (Signed)
Ortho bundle pre-op call completed. 

## 2022-02-26 NOTE — H&P (Signed)
TOTAL HIP ADMISSION H&P  Patient is admitted for left total hip arthroplasty.  Subjective:  Chief Complaint: left hip pain  HPI: Kathy Herrera, 61 y.o. female, has a history of pain and functional disability in the left hip(s) due to arthritis and patient has failed non-surgical conservative treatments for greater than 12 weeks to include NSAID's and/or analgesics, corticosteriod injections, flexibility and strengthening excercises, use of assistive devices, weight reduction as appropriate, and activity modification.  Onset of symptoms was gradual starting 3 years ago with gradually worsening course since that time.The patient noted no past surgery on the left hip(s).  Patient currently rates pain in the left hip at 10 out of 10 with activity. Patient has night pain, worsening of pain with activity and weight bearing, trendelenberg gait, pain that interfers with activities of daily living, and pain with passive range of motion. Patient has evidence of subchondral sclerosis, periarticular osteophytes, and joint space narrowing by imaging studies. This condition presents safety issues increasing the risk of falls.  There is no current active infection.  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[place]) 06/01/2020   Unilateral primary osteoarthritis, left hip 62/69/4854   LSC (lichen simplex chronicus) 09/11/2019   Vitamin D deficiency 12/28/2018   Elevated hemoglobin ([place]) 10/29/2018   Crohn's disease of ileum, unspecified complication ([place]) 62/70/3500   ASCVD (arteriosclerotic cardiovascular disease) 10/28/2018   Healthcare maintenance  08/21/2016   Enterocolitis 07/14/2015   Perforated viscus 07/14/2015   Hyperthyroidism 12/23/2014   Spinal stenosis, lumbar region, with neurogenic claudication 02/02/2014   Spondylolisthesis of lumbar region 02/02/2014   Lumbar degenerative disc disease 02/02/2014   Morbid obesity ([place]) 02/11/2013   Tobacco abuse 09/17/2012   Essential hypertension    Hyperlipidemia    Past Medical History:  Diagnosis Date   AAA (abdominal aortic aneurysm) (HCC)    Crohn disease ([place])    Depression    History. No current problems   DJD (degenerative joint disease) of lumbar spine    Dyspnea    With Activity   GERD (gastroesophageal reflux disease)    no current problems   Heart murmur    patient denies, states she was "diagnosed in [place], no follow up here"   Heart murmur    no current problems   Hyperlipidemia    Hypertension    Hyperthyroidism    Lactose intolerance    Perforated viscus 07/2015   Peripheral vascular disease ([place])    Rheumatoid arthritis ([place])    STD (sexually transmitted disease)    Condyloma   Thyroid disorder    Vitamin D deficiency     Past Surgical History:  Procedure Laterality Date   ABDOMINAL AORTIC ENDOVASCULAR STENT GRAFT Bilateral 06/24/2020   Procedure: ABDOMINAL AORTIC ENDOVASCULAR STENT GRAFT;  Surgeon: Cherre Robins, MD;  Location: [facility] OR;  Service: Vascular;  Laterality: Bilateral;   ABDOMINAL HYSTERECTOMY     partial- still has cervix.   arm surgery Left    CHOLECYSTECTOMY     COLONOSCOPY     Last one prior to [place] Right 06/24/2020   Procedure: COIL EMBOLIZATION RIGHT INTERNAL ILIAC ARTERY;  Surgeon: Cherre Robins, MD;  Location: [facility];  Service: Vascular;  Laterality: Right;   LUMBAR FUSION N/A 02/02/2014   Procedure: CENTRAL LAMINECTOMY L4-5,L5-S1, TRANSFORAMINAL LUMBAR INTERBODY FUSION L4-5,L5-S1, RODS AND SCREW FIX  AND L5-S1, LOCAL BONE GRAFT;  Surgeon: Jessy Oto, MD;  Location: [facility];  Service: Orthopedics;   Laterality: N/A;   TONSILLECTOMY AND ADENOIDECTOMY  1992   ULTRASOUND GUIDANCE FOR VASCULAR ACCESS Bilateral 06/24/2020   Procedure: ULTRASOUND GUIDANCE FOR VASCULAR ACCESS;  Surgeon: Cherre Robins, MD;  Location: [facility] OR;  Service: Vascular;  Laterality: Bilateral;    No current facility-administered medications for this encounter.   Current Outpatient Medications  Medication Sig Dispense Refill Last Dose   acetaminophen (TYLENOL) 500 MG tablet Take 1,000 mg by mouth every 6 (six) hours as needed for moderate pain.      albuterol (VENTOLIN HFA) 108 (90 Base) MCG/ACT inhaler Inhale 2 puffs into the lungs every 6 (six) hours as needed for wheezing or shortness of breath. 8 g 0    aspirin EC 81 MG tablet Take 81 mg by mouth daily.      atenolol (TENORMIN) 100 MG tablet TAKE 1 TABLET BY MOUTH DAILY. 90 tablet 0    cholecalciferol (VITAMIN D) 25 MCG (1000 UNIT) tablet Take 1,000 Units by mouth daily.      DULoxetine (CYMBALTA) 20 MG capsule Take 1 capsule (20 mg total) by mouth daily. (Patient taking differently: Take 20 mg by mouth daily as needed (pain).) 30 capsule 3    hydrochlorothiazide (HYDRODIURIL) 25 MG tablet TAKE 1 TABLET EVERY DAY 90 tablet 0    lisinopril (ZESTRIL) 40 MG tablet TAKE 1 TABLET EVERY DAY 90 tablet 0    MELATONIN PO Take 1 tablet by mouth at bedtime as needed (sleep).      Multiple Vitamins-Minerals (MULTIVITAMIN WITH MINERALS) tablet Take 1 tablet by mouth daily.      potassium chloride (KLOR-CON) 10 MEQ tablet Take 10 mEq by mouth daily.      rosuvastatin (CRESTOR) 10 MG tablet TAKE 1 TABLET EVERY DAY 90 tablet 0    traZODone (DESYREL) 50 MG tablet Take 50 mg by mouth at bedtime as needed for sleep.      vitamin B-12 (CYANOCOBALAMIN) 100 MCG tablet Take 100 mcg by mouth daily.      diclofenac Sodium (VOLTAREN) 1 % GEL Apply 4 g topically 4 (four) times daily. (Patient not taking: Reported on 02/19/2022) 350 g 5 Not Taking   potassium chloride (KLOR-CON M) 10 MEQ  tablet Take 1 tablet (10 mEq total) by mouth daily. (Patient not taking: Reported on 02/19/2022) 90 tablet 2 Not Taking   Allergies  Allergen Reactions   Shrimp [Shellfish Allergy] Hives    May be seasoning not always   Percocet [Oxycodone-Acetaminophen] Nausea Only    Social History   Tobacco Use   Smoking status: Every Day    Packs/day: 0.50    Years: 50.00    Total pack years: 25.00    Types: Cigars, Cigarettes    Start date: 05/27/1968    Passive exposure: Never   Smokeless tobacco: Never   Tobacco comments:    Quit smoking 2 weeks ago  Substance Use Topics   Alcohol use: Yes    Comment: rarely    Family History  Problem Relation Age of Onset   Cancer Mother 9       cervical   CVA Mother    Hypertension Mother    Diabetes Mother  Obesity Mother    High Cholesterol Mother    Thyroid disease Daughter    Diabetes Daughter    Breast cancer Neg Hx      Review of Systems  Objective:  Physical Exam Vitals reviewed.  Constitutional:      Appearance: Normal appearance. She is obese.  HENT:     Head: Normocephalic and atraumatic.  Eyes:     Extraocular Movements: Extraocular movements intact.     Pupils: Pupils are equal, round, and reactive to light.  Cardiovascular:     Rate and Rhythm: Normal rate.     Pulses: Normal pulses.  Pulmonary:     Effort: Pulmonary effort is normal.  Abdominal:     Palpations: Abdomen is soft.  Musculoskeletal:     Cervical back: Normal range of motion and neck supple.     Left hip: Tenderness and bony tenderness present. Decreased range of motion. Decreased strength.  Neurological:     Mental Status: She is alert and oriented to person, place, and time.  Psychiatric:        Behavior: Behavior normal.     Vital signs in last 24 hours:    Labs:   Estimated body mass index is 39.56 kg/m as calculated from the following:   Height as of 02/20/22: '5\' 6"'$  (1.676 m).   Weight as of 02/20/22: 111.2 kg.   Imaging  Review Plain radiographs demonstrate severe degenerative joint disease of the left hip(s). The bone quality appears to be good for age and reported activity level.      Assessment/Plan:  End stage arthritis, left hip(s)  The patient history, physical examination, clinical judgement of the provider and imaging studies are consistent with end stage degenerative joint disease of the left hip(s) and total hip arthroplasty is deemed medically necessary. The treatment options including medical management, injection therapy, arthroscopy and arthroplasty were discussed at length. The risks and benefits of total hip arthroplasty were presented and reviewed. The risks due to aseptic loosening, infection, stiffness, dislocation/subluxation,  thromboembolic complications and other imponderables were discussed.  The patient acknowledged the explanation, agreed to proceed with the plan and consent was signed. Patient is being admitted for inpatient treatment for surgery, pain control, PT, OT, prophylactic antibiotics, VTE prophylaxis, progressive ambulation and ADL's and discharge planning.The patient is planning to be discharged home with home health services

## 2022-02-27 ENCOUNTER — Other Ambulatory Visit: Payer: Self-pay

## 2022-02-27 ENCOUNTER — Ambulatory Visit (HOSPITAL_COMMUNITY): Payer: Medicare HMO

## 2022-02-27 ENCOUNTER — Observation Stay (HOSPITAL_COMMUNITY): Payer: Medicare HMO

## 2022-02-27 ENCOUNTER — Encounter (HOSPITAL_COMMUNITY): Payer: Self-pay | Admitting: Orthopaedic Surgery

## 2022-02-27 ENCOUNTER — Observation Stay (HOSPITAL_COMMUNITY)
Admission: RE | Admit: 2022-02-27 | Discharge: 2022-02-28 | Disposition: A | Discharge status: Home Health | Payer: Medicare HMO | Attending: Orthopaedic Surgery | Admitting: Orthopaedic Surgery

## 2022-02-27 ENCOUNTER — Ambulatory Visit (HOSPITAL_BASED_OUTPATIENT_CLINIC_OR_DEPARTMENT_OTHER): Payer: Medicare HMO | Admitting: Anesthesiology

## 2022-02-27 ENCOUNTER — Ambulatory Visit (HOSPITAL_COMMUNITY): Payer: Medicare HMO | Admitting: Physician Assistant

## 2022-02-27 ENCOUNTER — Encounter (HOSPITAL_COMMUNITY)
Admission: RE | Disposition: A | Discharge status: Home Health | Payer: Self-pay | Source: Home / Self Care | Attending: Orthopaedic Surgery

## 2022-02-27 DIAGNOSIS — Z79899 Other long term (current) drug therapy: Secondary | ICD-10-CM | POA: Diagnosis not present

## 2022-02-27 DIAGNOSIS — I10 Essential (primary) hypertension: Secondary | ICD-10-CM | POA: Insufficient documentation

## 2022-02-27 DIAGNOSIS — F17210 Nicotine dependence, cigarettes, uncomplicated: Secondary | ICD-10-CM | POA: Insufficient documentation

## 2022-02-27 DIAGNOSIS — Z96642 Presence of left artificial hip joint: Secondary | ICD-10-CM

## 2022-02-27 DIAGNOSIS — I739 Peripheral vascular disease, unspecified: Secondary | ICD-10-CM

## 2022-02-27 DIAGNOSIS — E039 Hypothyroidism, unspecified: Secondary | ICD-10-CM | POA: Diagnosis not present

## 2022-02-27 DIAGNOSIS — Z23 Encounter for immunization: Secondary | ICD-10-CM | POA: Insufficient documentation

## 2022-02-27 DIAGNOSIS — M1612 Unilateral primary osteoarthritis, left hip: Secondary | ICD-10-CM

## 2022-02-27 DIAGNOSIS — Z6839 Body mass index (BMI) 39.0-39.9, adult: Secondary | ICD-10-CM | POA: Diagnosis not present

## 2022-02-27 DIAGNOSIS — Z7982 Long term (current) use of aspirin: Secondary | ICD-10-CM | POA: Diagnosis not present

## 2022-02-27 DIAGNOSIS — R69 Illness, unspecified: Secondary | ICD-10-CM | POA: Diagnosis not present

## 2022-02-27 DIAGNOSIS — Z471 Aftercare following joint replacement surgery: Secondary | ICD-10-CM | POA: Diagnosis not present

## 2022-02-27 HISTORY — PX: TOTAL HIP ARTHROPLASTY: SHX124

## 2022-02-27 SURGERY — ARTHROPLASTY, HIP, TOTAL, ANTERIOR APPROACH
Anesthesia: Spinal | Site: Hip | Laterality: Left

## 2022-02-27 MED ORDER — MENTHOL 3 MG MT LOZG: 1.0000 | LOZENGE | OROMUCOSAL | Status: DC | PRN

## 2022-02-27 MED ORDER — DULOXETINE HCL 20 MG PO CPEP: 20.0000 mg | ORAL_CAPSULE | Freq: Every day | ORAL | Status: DC | PRN

## 2022-02-27 MED ORDER — EPHEDRINE SULFATE-NACL 50-0.9 MG/10ML-% IV SOSY
PREFILLED_SYRINGE | INTRAVENOUS | Status: DC | PRN
  Administered 2022-02-27: 5 mg via INTRAVENOUS

## 2022-02-27 MED ORDER — FENTANYL CITRATE (PF) 100 MCG/2ML IJ SOLN: 25.0000 ug | INTRAMUSCULAR | Status: DC | PRN

## 2022-02-27 MED ORDER — MIDAZOLAM HCL 5 MG/5ML IJ SOLN
INTRAMUSCULAR | Status: DC | PRN
  Administered 2022-02-27: 2 mg via INTRAVENOUS

## 2022-02-27 MED ORDER — DEXAMETHASONE SODIUM PHOSPHATE 10 MG/ML IJ SOLN
INTRAMUSCULAR | Status: DC | PRN
  Administered 2022-02-27: 8 mg via INTRAVENOUS

## 2022-02-27 MED ORDER — ONDANSETRON HCL 4 MG PO TABS: 4.0000 mg | ORAL_TABLET | Freq: Four times a day (QID) | ORAL | Status: DC | PRN

## 2022-02-27 MED ORDER — TRANEXAMIC ACID-NACL 1000-0.7 MG/100ML-% IV SOLN
1000.0000 mg | INTRAVENOUS | Status: AC
  Administered 2022-02-27: 1000 mg via INTRAVENOUS
  Filled 2022-02-27: qty 100

## 2022-02-27 MED ORDER — ORAL CARE MOUTH RINSE: 15.0000 mL | Freq: Once | OROMUCOSAL | Status: AC

## 2022-02-27 MED ORDER — BUPIVACAINE IN DEXTROSE 0.75-8.25 % IT SOLN
INTRATHECAL | Status: DC | PRN
  Administered 2022-02-27: 1.6 mL via INTRATHECAL

## 2022-02-27 MED ORDER — ACETAMINOPHEN 325 MG PO TABS
325.0000 mg | ORAL_TABLET | Freq: Four times a day (QID) | ORAL | Status: DC | PRN
  Administered 2022-02-27: 650 mg via ORAL
  Filled 2022-02-27: qty 2

## 2022-02-27 MED ORDER — ASPIRIN 81 MG PO CHEW
81.0000 mg | CHEWABLE_TABLET | Freq: Two times a day (BID) | ORAL | Status: DC
  Administered 2022-02-27 – 2022-02-28 (×2): 81 mg via ORAL
  Filled 2022-02-27 (×2): qty 1

## 2022-02-27 MED ORDER — DOCUSATE SODIUM 100 MG PO CAPS
100.0000 mg | ORAL_CAPSULE | Freq: Two times a day (BID) | ORAL | Status: DC
  Administered 2022-02-27 – 2022-02-28 (×2): 100 mg via ORAL
  Filled 2022-02-27 (×2): qty 1

## 2022-02-27 MED ORDER — PROPOFOL 500 MG/50ML IV EMUL
INTRAVENOUS | Status: DC | PRN
  Administered 2022-02-27: 50 ug/kg/min via INTRAVENOUS

## 2022-02-27 MED ORDER — LISINOPRIL 20 MG PO TABS
40.0000 mg | ORAL_TABLET | Freq: Every day | ORAL | Status: DC
  Administered 2022-02-27 – 2022-02-28 (×2): 40 mg via ORAL
  Filled 2022-02-27 (×2): qty 2

## 2022-02-27 MED ORDER — ACETAMINOPHEN 500 MG PO TABS
1000.0000 mg | ORAL_TABLET | Freq: Once | ORAL | Status: AC
  Administered 2022-02-27: 1000 mg via ORAL
  Filled 2022-02-27: qty 2

## 2022-02-27 MED ORDER — PHENYLEPHRINE HCL-NACL 20-0.9 MG/250ML-% IV SOLN
INTRAVENOUS | Status: DC | PRN
  Administered 2022-02-27: 40 ug/min via INTRAVENOUS

## 2022-02-27 MED ORDER — HYDROCHLOROTHIAZIDE 25 MG PO TABS
25.0000 mg | ORAL_TABLET | Freq: Every day | ORAL | Status: DC
  Administered 2022-02-27 – 2022-02-28 (×2): 25 mg via ORAL
  Filled 2022-02-27 (×2): qty 1

## 2022-02-27 MED ORDER — CHLORHEXIDINE GLUCONATE CLOTH 2 % EX PADS: 6.0000 | MEDICATED_PAD | Freq: Every day | CUTANEOUS | Status: DC

## 2022-02-27 MED ORDER — POVIDONE-IODINE 10 % EX SWAB
2.0000 | Freq: Once | CUTANEOUS | Status: AC
  Administered 2022-02-27: 2 via TOPICAL

## 2022-02-27 MED ORDER — HYDROMORPHONE HCL 1 MG/ML IJ SOLN: 0.5000 mg | INTRAMUSCULAR | Status: DC | PRN

## 2022-02-27 MED ORDER — ONDANSETRON HCL 4 MG/2ML IJ SOLN: 4.0000 mg | Freq: Four times a day (QID) | INTRAMUSCULAR | Status: DC | PRN

## 2022-02-27 MED ORDER — CEFAZOLIN SODIUM-DEXTROSE 1-4 GM/50ML-% IV SOLN
1.0000 g | Freq: Four times a day (QID) | INTRAVENOUS | Status: AC
  Administered 2022-02-27 – 2022-02-28 (×2): 1 g via INTRAVENOUS
  Filled 2022-02-27 (×2): qty 50

## 2022-02-27 MED ORDER — METHOCARBAMOL 1000 MG/10ML IJ SOLN
500.0000 mg | Freq: Four times a day (QID) | INTRAVENOUS | Status: DC | PRN
  Filled 2022-02-27: qty 5

## 2022-02-27 MED ORDER — TRAZODONE HCL 50 MG PO TABS
50.0000 mg | ORAL_TABLET | Freq: Every evening | ORAL | Status: DC | PRN
  Administered 2022-02-27: 50 mg via ORAL
  Filled 2022-02-27: qty 1

## 2022-02-27 MED ORDER — PHENOL 1.4 % MT LIQD: 1.0000 | OROMUCOSAL | Status: DC | PRN

## 2022-02-27 MED ORDER — LACTATED RINGERS IV SOLN: INTRAVENOUS | Status: DC

## 2022-02-27 MED ORDER — METHOCARBAMOL 500 MG PO TABS
500.0000 mg | ORAL_TABLET | Freq: Four times a day (QID) | ORAL | Status: DC | PRN
  Administered 2022-02-27 – 2022-02-28 (×3): 500 mg via ORAL
  Filled 2022-02-27 (×3): qty 1

## 2022-02-27 MED ORDER — ALUM & MAG HYDROXIDE-SIMETH 200-200-20 MG/5ML PO SUSP: 30.0000 mL | ORAL | Status: DC | PRN

## 2022-02-27 MED ORDER — METOCLOPRAMIDE HCL 5 MG/ML IJ SOLN: 5.0000 mg | Freq: Three times a day (TID) | INTRAMUSCULAR | Status: DC | PRN

## 2022-02-27 MED ORDER — DIPHENHYDRAMINE HCL 12.5 MG/5ML PO ELIX: 12.5000 mg | ORAL_SOLUTION | ORAL | Status: DC | PRN

## 2022-02-27 MED ORDER — OXYCODONE HCL 5 MG PO TABS
5.0000 mg | ORAL_TABLET | ORAL | Status: DC | PRN
  Administered 2022-02-27 – 2022-02-28 (×2): 10 mg via ORAL
  Administered 2022-02-28: 5 mg via ORAL
  Filled 2022-02-27 (×3): qty 2

## 2022-02-27 MED ORDER — VITAMIN B-12 100 MCG PO TABS
100.0000 ug | ORAL_TABLET | Freq: Every day | ORAL | Status: DC
  Administered 2022-02-28: 100 ug via ORAL
  Filled 2022-02-27: qty 1

## 2022-02-27 MED ORDER — PANTOPRAZOLE SODIUM 40 MG PO TBEC
40.0000 mg | DELAYED_RELEASE_TABLET | Freq: Every day | ORAL | Status: DC
  Administered 2022-02-27 – 2022-02-28 (×2): 40 mg via ORAL
  Filled 2022-02-27 (×2): qty 1

## 2022-02-27 MED ORDER — HYDROMORPHONE HCL 2 MG PO TABS: 2.0000 mg | ORAL_TABLET | ORAL | Status: DC | PRN

## 2022-02-27 MED ORDER — ONDANSETRON HCL 4 MG/2ML IJ SOLN
INTRAMUSCULAR | Status: DC | PRN
  Administered 2022-02-27: 4 mg via INTRAVENOUS

## 2022-02-27 MED ORDER — SODIUM CHLORIDE 0.9 % IV SOLN: INTRAVENOUS | Status: DC

## 2022-02-27 MED ORDER — POTASSIUM CHLORIDE CRYS ER 10 MEQ PO TBCR
10.0000 meq | EXTENDED_RELEASE_TABLET | Freq: Every day | ORAL | Status: DC
  Administered 2022-02-27 – 2022-02-28 (×2): 10 meq via ORAL
  Filled 2022-02-27 (×2): qty 1

## 2022-02-27 MED ORDER — CEFAZOLIN SODIUM-DEXTROSE 2-4 GM/100ML-% IV SOLN
2.0000 g | INTRAVENOUS | Status: AC
  Administered 2022-02-27: 2 g via INTRAVENOUS
  Filled 2022-02-27: qty 100

## 2022-02-27 MED ORDER — ALBUTEROL SULFATE (2.5 MG/3ML) 0.083% IN NEBU: 3.0000 mL | INHALATION_SOLUTION | Freq: Four times a day (QID) | RESPIRATORY_TRACT | Status: DC | PRN

## 2022-02-27 MED ORDER — ATENOLOL 100 MG PO TABS
100.0000 mg | ORAL_TABLET | Freq: Every day | ORAL | Status: DC
  Administered 2022-02-28: 100 mg via ORAL
  Filled 2022-02-27: qty 1

## 2022-02-27 MED ORDER — CHLORHEXIDINE GLUCONATE 0.12 % MT SOLN
15.0000 mL | Freq: Once | OROMUCOSAL | Status: AC
  Administered 2022-02-27: 15 mL via OROMUCOSAL
  Filled 2022-02-27: qty 15

## 2022-02-27 MED ORDER — MIDAZOLAM HCL 2 MG/2ML IJ SOLN
INTRAMUSCULAR | Status: AC
  Filled 2022-02-27: qty 2

## 2022-02-27 MED ORDER — PROMETHAZINE HCL 25 MG/ML IJ SOLN: 6.2500 mg | INTRAMUSCULAR | Status: DC | PRN

## 2022-02-27 MED ORDER — METOCLOPRAMIDE HCL 5 MG PO TABS: 5.0000 mg | ORAL_TABLET | Freq: Three times a day (TID) | ORAL | Status: DC | PRN

## 2022-02-27 MED ORDER — VITAMIN D 25 MCG (1000 UNIT) PO TABS
1000.0000 [IU] | ORAL_TABLET | Freq: Every day | ORAL | Status: DC
  Administered 2022-02-28: 1000 [IU] via ORAL
  Filled 2022-02-27: qty 1

## 2022-02-27 MED ORDER — SODIUM CHLORIDE 0.9 % IR SOLN
Status: DC | PRN
  Administered 2022-02-27: 1000 mL

## 2022-02-27 MED ORDER — INFLUENZA VAC SPLIT QUAD 0.5 ML IM SUSY
0.5000 mL | PREFILLED_SYRINGE | INTRAMUSCULAR | Status: AC
  Administered 2022-02-28: 0.5 mL via INTRAMUSCULAR
  Filled 2022-02-27: qty 0.5

## 2022-02-27 MED ORDER — PROPOFOL 10 MG/ML IV BOLUS
INTRAVENOUS | Status: DC | PRN
  Administered 2022-02-27: 30 mg via INTRAVENOUS

## 2022-02-27 MED ORDER — ROSUVASTATIN CALCIUM 5 MG PO TABS
10.0000 mg | ORAL_TABLET | Freq: Every day | ORAL | Status: DC
  Administered 2022-02-28: 10 mg via ORAL
  Filled 2022-02-27: qty 2

## 2022-02-27 SURGICAL SUPPLY — 52 items
BAG COUNTER SPONGE SURGICOUNT (BAG) ×1 IMPLANT
BENZOIN TINCTURE PRP APPL 2/3 (GAUZE/BANDAGES/DRESSINGS) ×1 IMPLANT
BLADE CLIPPER SURG (BLADE) IMPLANT
BLADE SAW SGTL 18X1.27X75 (BLADE) ×1 IMPLANT
COVER SURGICAL LIGHT HANDLE (MISCELLANEOUS) ×1 IMPLANT
DRAPE C-ARM 42X72 X-RAY (DRAPES) ×1 IMPLANT
DRAPE STERI IOBAN 125X83 (DRAPES) ×1 IMPLANT
DRAPE U-SHAPE 47X51 STRL (DRAPES) ×3 IMPLANT
DRSG AQUACEL AG ADV 3.5X10 (GAUZE/BANDAGES/DRESSINGS) ×1 IMPLANT
DURAPREP 26ML APPLICATOR (WOUND CARE) ×1 IMPLANT
ELECT BLADE 4.0 EZ CLEAN MEGAD (MISCELLANEOUS)
ELECT BLADE 6.5 EXT (BLADE) IMPLANT
ELECT REM PT RETURN 9FT ADLT (ELECTROSURGICAL) ×1
ELECTRODE BLDE 4.0 EZ CLN MEGD (MISCELLANEOUS) ×1 IMPLANT
ELECTRODE REM PT RTRN 9FT ADLT (ELECTROSURGICAL) ×1 IMPLANT
FACESHIELD WRAPAROUND (MASK) ×2 IMPLANT
FACESHIELD WRAPAROUND OR TEAM (MASK) ×2 IMPLANT
GLOVE BIOGEL PI IND STRL 8 (GLOVE) ×2 IMPLANT
GLOVE ECLIPSE 8.0 STRL XLNG CF (GLOVE) ×1 IMPLANT
GLOVE ORTHO TXT STRL SZ7.5 (GLOVE) ×2 IMPLANT
GOWN STRL REUS W/ TWL LRG LVL3 (GOWN DISPOSABLE) ×2 IMPLANT
GOWN STRL REUS W/ TWL XL LVL3 (GOWN DISPOSABLE) ×2 IMPLANT
GOWN STRL REUS W/TWL LRG LVL3 (GOWN DISPOSABLE) ×3
GOWN STRL REUS W/TWL XL LVL3 (GOWN DISPOSABLE) ×1
HANDPIECE INTERPULSE COAX TIP (DISPOSABLE)
HEAD CERAMIC 36 PLUS5 (Hips) IMPLANT
KIT BASIN OR (CUSTOM PROCEDURE TRAY) ×1 IMPLANT
KIT TURNOVER KIT B (KITS) ×1 IMPLANT
LINER NEUTRAL 52X36MM PLUS 4 (Liner) IMPLANT
MANIFOLD NEPTUNE II (INSTRUMENTS) ×1 IMPLANT
NS IRRIG 1000ML POUR BTL (IV SOLUTION) ×1 IMPLANT
PACK TOTAL JOINT (CUSTOM PROCEDURE TRAY) ×1 IMPLANT
PAD ARMBOARD 7.5X6 YLW CONV (MISCELLANEOUS) ×1 IMPLANT
PIN SECTOR W/GRIP ACE CUP 52MM (Hips) IMPLANT
SET HNDPC FAN SPRY TIP SCT (DISPOSABLE) ×1 IMPLANT
STAPLER VISISTAT 35W (STAPLE) IMPLANT
STEM FEM ACTIS HIGH SZ3 (Stem) IMPLANT
STRIP CLOSURE SKIN 1/2X4 (GAUZE/BANDAGES/DRESSINGS) ×2 IMPLANT
SUT ETHIBOND NAB CT1 #1 30IN (SUTURE) ×1 IMPLANT
SUT MNCRL AB 4-0 PS2 18 (SUTURE) IMPLANT
SUT VIC AB 0 CT1 27 (SUTURE) ×1
SUT VIC AB 0 CT1 27XBRD ANBCTR (SUTURE) ×1 IMPLANT
SUT VIC AB 1 CT1 27 (SUTURE) ×1
SUT VIC AB 1 CT1 27XBRD ANBCTR (SUTURE) ×1 IMPLANT
SUT VIC AB 2-0 CT1 27 (SUTURE) ×2
SUT VIC AB 2-0 CT1 TAPERPNT 27 (SUTURE) ×1 IMPLANT
TOWEL GREEN STERILE (TOWEL DISPOSABLE) ×1 IMPLANT
TOWEL GREEN STERILE FF (TOWEL DISPOSABLE) ×1 IMPLANT
TRAY CATH 16FR W/PLASTIC CATH (SET/KITS/TRAYS/PACK) IMPLANT
TRAY FOLEY W/BAG SLVR 16FR (SET/KITS/TRAYS/PACK) ×1
TRAY FOLEY W/BAG SLVR 16FR ST (SET/KITS/TRAYS/PACK) IMPLANT
WATER STERILE IRR 1000ML POUR (IV SOLUTION) ×2 IMPLANT

## 2022-02-27 NOTE — Anesthesia Preprocedure Evaluation (Addendum)
Anesthesia Evaluation  Patient identified by MRN, date of birth, ID band Patient awake    Reviewed: Allergy & Precautions, NPO status , Patient's Chart, lab work & pertinent test results, reviewed documented beta blocker date and time   History of Anesthesia Complications Negative for: history of anesthetic complications  Airway Mallampati: II  TM Distance: >3 FB Neck ROM: Full    Dental  (+) Dental Advisory Given, Edentulous Upper, Partial Lower   Pulmonary Current Smoker and Patient abstained from smoking.   Pulmonary exam normal        Cardiovascular hypertension, Pt. on medications and Pt. on home beta blockers + Peripheral Vascular Disease  Normal cardiovascular exam   S/p Abd EVAR   Neuro/Psych  PSYCHIATRIC DISORDERS  Depression    negative neurological ROS     GI/Hepatic Neg liver ROS,GERD  Controlled,, Crohn's disease    Endo/Other    Morbid obesity  Renal/GU negative Renal ROS     Musculoskeletal  (+) Arthritis , Rheumatoid disorders,    Abdominal  (+) + obese  Peds  Hematology negative hematology ROS (+)  Plt 216k    Anesthesia Other Findings   Reproductive/Obstetrics  s/p hysterectomy                              Anesthesia Physical Anesthesia Plan  ASA: 3  Anesthesia Plan: Spinal   Post-op Pain Management: Tylenol PO (pre-op)*   Induction:   PONV Risk Score and Plan: 2 and Treatment may vary due to age or medical condition and Propofol infusion  Airway Management Planned: Natural Airway and Simple Face Mask  Additional Equipment: None  Intra-op Plan:   Post-operative Plan:   Informed Consent: I have reviewed the patients History and Physical, chart, labs and discussed the procedure including the risks, benefits and alternatives for the proposed anesthesia with the patient or authorized representative who has indicated his/her understanding and acceptance.        Plan Discussed with: CRNA and Anesthesiologist  Anesthesia Plan Comments: (Labs reviewed, platelets acceptable. Discussed risks and benefits of spinal, including spinal/epidural hematoma, infection, failed block, and PDPH. Patient expressed understanding and wished to proceed. )        Anesthesia Quick Evaluation

## 2022-02-27 NOTE — Op Note (Signed)
Operative Note  Date of operation: 02/27/2022 Preoperative diagnosis: Left hip primary osteoarthritis Postoperative diagnosis: Same  Procedure: Left direct anterior total hip arthroplasty  Surgeon: Lind Guest. Ninfa Linden, MD Assistant: Wandra Arthurs, RNFA  Anesthesia: Spinal Antibiotics: 2 g IV Ancef EBL: 865 cc Complications: None  Indications: The patient is a 61 year old female with well-documented debilitating arthritis involving her left hip.  She has tried and failed all forms conservative treatment and now wishes to proceed with a total hip arthroplasty.  The risk of acute blood loss anemia, nerve vessel injury, fracture, infection, DVT, implant failure, dislocation, leg length differences and wound healing issues were all explained in detail.  She understands these are significant heightened given her morbid obesity.  She also understands her goals are hopefully decrease pain, improve mobility, and improve quality of life.  Procedure description: After informed consent was obtained and the appropriate left hip was marked, the patient was brought to the operating room and set up on the stretcher where spinal anesthesia was successfully obtained.  She was then laid in supine position on the stretcher and a Foley catheter was placed.  Traction boots were placed on both her feet and she was then placed supine on the Hana fracture table with a perineal post in place in both legs and inline skeletal traction devices but no traction applied.  Her left operative hip was assessed radiographically.  He was then prepped and draped with DuraPrep and sterile drapes.  A timeout was called and she identified as correct patient correct left hip.  An incision was then made just inferior and posterior to the ASIS and carried slightly obliquely down the leg.  Dissection was carried down to the tensor fascia lata muscle and the tensor fascia was then divided longitudinally to proceed with a direct anterior  approach to the hip.  Circumflex vessels were identified and cauterized.  The hip capsule was then opened and a moderate joint effusion was encountered.  Cobra retractors were placed around the medial and lateral femoral neck and a femoral neck cut was made with an oscillating saw just proximal to the lesser trochanter.  This was completed with an osteotome.  A corkscrew guide was placed in the femoral head and the femoral head was removed in its entirety.  There was a small area with significant cartilage wear.  A bent Hohmann was placed over the medial acetabular rim and remnants of the acetabular labrum and other debris were removed.  Reaming was then initiated from a size 43 reamer and stepwise increments going up to a size 51 reamer with all reamers placed in direct visitation and the last reamer was placed under direct fluoroscopy in order to obtain the depth of reaming, the inclination and the anteversion.  The real DePuy sector GRIPTION acetabular component size 52 was then placed without difficulty and a 36+4 polythene liner was placed within that size 52 acetabular component.  Attention was then turned to the femur.  With the left leg externally rotated to 120 degrees, extended and adducted, a Mueller retractor was placed by the medial calcar and a Hohmann retractor was placed behind the greater trochanter.  Broaching was then initiated from a size 0 broach going and stepwise increments up to a size 3 broach.  With a size 3 broach in place we trialed a standard offset femoral neck and a 36+1.5 hip ball.  This was then reduced in the pelvis.  Based on radiographic and clinical assessment we needed more offset and leg  length.  The hip was dislocated and the trial components were removed.  We then placed the real Actis DePuy femoral component size 3 but 1 with high offset.  We then went with also a real 36+5 ceramic head ball.  This was reduced in the pelvis.  We were pleased with range of motion, offset,  stability and leg length assessed radiographically and mechanically.  This was difficult given her morbid obesity in terms of assessing things radiographically.  The soft tissue was then irrigated with normal saline solution.  The joint capsule was closed with interrupted #1 Vicryl suture followed by 0 Vicryl close the deep tissue and 2-0 Vicryl close subcutaneous tissue.  The skin was closed with staples.  Aquacel dressings applied.  The patient was taken off the Hana table and taken recovery room in stable condition with all final counts being correct and no complications noted.

## 2022-02-27 NOTE — Progress Notes (Signed)
Pt. Arrived to un it alert and oriented no c/o pain. Family notified via phone call. Bed in lowest position call bell within reach.

## 2022-02-27 NOTE — Interval H&P Note (Signed)
History and Physical Interval Note: The patient understands that she is here today for a left total hip replacement to treat her severe left hip osteoarthritis.  There has been no acute or interval change in her medical status.  See recent H&P.  The risks and benefits of surgery have been explained in detail and informed consent is obtained.  The left operative hip has been marked.  02/27/2022 11:37 AM  Kathy Herrera  has presented today for surgery, with the diagnosis of osteoarthritis left hip.  The various methods of treatment have been discussed with the patient and family. After consideration of risks, benefits and other options for treatment, the patient has consented to  Procedure(s) with comments: LEFT TOTAL HIP ARTHROPLASTY ANTERIOR APPROACH (Left) - Needs RNFA as a surgical intervention.  The patient's history has been reviewed, patient examined, no change in status, stable for surgery.  I have reviewed the patient's chart and labs.  Questions were answered to the patient's satisfaction.     Mcarthur Rossetti

## 2022-02-27 NOTE — Anesthesia Procedure Notes (Signed)
Spinal  Patient location during procedure: OR Start time: 02/27/2022 12:28 PM End time: 02/27/2022 12:33 PM Reason for block: surgical anesthesia Staffing Performed: anesthesiologist  Anesthesiologist: Audry Pili, MD Performed by: Audry Pili, MD Authorized by: Audry Pili, MD   Preanesthetic Checklist Completed: patient identified, IV checked, risks and benefits discussed, surgical consent, monitors and equipment checked, pre-op evaluation and timeout performed Spinal Block Patient position: sitting Prep: DuraPrep Patient monitoring: heart rate, cardiac monitor, continuous pulse ox and blood pressure Approach: midline Location: L3-4 Injection technique: single-shot Needle Needle type: Quincke  Needle gauge: 22 G Needle length: 12.7 cm Additional Notes Consent was obtained prior to the procedure with all questions answered and concerns addressed. Risks including, but not limited to, bleeding, infection, nerve damage, paralysis, failed block, inadequate analgesia, allergic reaction, high spinal, itching, and headache were discussed and the patient wished to proceed. Functioning IV was confirmed and monitors were applied. Sterile prep and drape, including hand hygiene, mask, and sterile gloves were used. The patient was positioned and the spine was prepped. The skin was anesthetized with lidocaine. Free flow of clear CSF was obtained prior to injecting local anesthetic into the CSF. The spinal needle aspirated freely following injection. The needle was carefully withdrawn. The patient tolerated the procedure well.   Renold Don, MD

## 2022-02-27 NOTE — Transfer of Care (Signed)
Immediate Anesthesia Transfer of Care Note  Patient: [place]  Procedure(s) Performed: LEFT TOTAL HIP ARTHROPLASTY ANTERIOR APPROACH (Left: Hip)  Patient Location: PACU  Anesthesia Type:Spinal  Level of Consciousness: awake  Airway & Oxygen Therapy: Patient Spontanous Breathing and Patient connected to face mask oxygen  Post-op Assessment: Report given to RN and Post -op Vital signs reviewed and stable  Post vital signs: Reviewed and stable  Last Vitals:  Vitals Value Taken Time  BP 99/53 02/27/22 1412  Temp    Pulse 66 02/27/22 1415  Resp 27 02/27/22 1415  SpO2 97 % 02/27/22 1415  Vitals shown include unvalidated device data.  Last Pain:  Vitals:   02/27/22 0919  TempSrc: Oral         Complications: No notable events documented.

## 2022-02-27 NOTE — Evaluation (Signed)
Physical Therapy Evaluation Patient Details Name: Kathy Herrera MRN: [id number] DOB: 04/27/1960 Today's Date: 02/27/2022  History of Present Illness  61 yo female s/p L DA-THA on 12/26. PMH includes AAA s/p stent 2022, crohn's disease, obesity, depression, HTN, HLD, lumbar fusion 2015.  Clinical Impression   Pt presents with L hip post-op pain, generalized weakness, impaired balance, and decreased activity tolerance. Pt to benefit from acute PT to address deficits. Pt requiring min assist for transfer-level mobility, unable to progress beyond pre-gait at EOB given gluteal and hip altered sensation suspect due to spinal (improving since surgery). Pt educated on ankle pumps (20/hour) to perform this afternoon/evening to increase circulation, to pt's tolerance and limited by pain. PT to progress mobility as tolerated, and will continue to follow acutely.         Recommendations for follow up therapy are one component of a multi-disciplinary discharge planning process, led by the attending physician.  Recommendations may be updated based on patient status, additional functional criteria and insurance authorization.  Follow Up Recommendations Follow physician's recommendations for discharge plan and follow up therapies      Assistance Recommended at Discharge Frequent or constant Supervision/Assistance  Patient can return home with the following  A little help with bathing/dressing/bathroom;A little help with walking and/or transfers    Equipment Recommendations None recommended by PT  Recommendations for Other Services       Functional Status Assessment Patient has had a recent decline in their functional status and demonstrates the ability to make significant improvements in function in a reasonable and predictable amount of time.     Precautions / Restrictions Precautions Precautions: Fall Restrictions Weight Bearing Restrictions: No      Mobility  Bed Mobility Overal bed  mobility: Needs Assistance Bed Mobility: Supine to Sit, Sit to Supine     Supine to sit: Min assist Sit to supine: Min assist   General bed mobility comments: light trunk and LLE assist    Transfers Overall transfer level: Needs assistance Equipment used: Rolling walker (2 wheels) Transfers: Sit to/from Stand Sit to Stand: Min assist           General transfer comment: rise and steady assist, cues for hand placement when rising/sitting. Pt able to march in place and take x1 lateral steps towards [facility], limited in further mobility given gluteal and hip altered sensation    Ambulation/Gait                  Stairs            Wheelchair Mobility    Modified Rankin (Stroke Patients Only)       Balance Overall balance assessment: Needs assistance Sitting-balance support: No upper extremity supported, Feet supported Sitting balance-Leahy Scale: Fair     Standing balance support: Bilateral upper extremity supported, During functional activity Standing balance-Leahy Scale: Poor                               Pertinent Vitals/Pain Pain Assessment Pain Assessment: Faces Faces Pain Scale: Hurts little more Pain Location: L hip Pain Descriptors / Indicators: Sore, Other (Comment) (pulling, in standing) Pain Intervention(s): Limited activity within patient's tolerance, Monitored during session, Repositioned    Home Living Family/patient expects to be discharged to:: Private residence Living Arrangements: Children Available Help at Discharge: Family Type of Home: House Home Access: Stairs to enter   Technical brewer of Steps: 1   Home Layout:  One level Home Equipment: Toilet riser;Cane - single point;Rolling Walker (2 wheels);Rollator (4 wheels)      Prior Function Prior Level of Function : Independent/Modified Independent             Mobility Comments: uses cane for mobility PTA       Hand Dominance   Dominant Hand:  Right    Extremity/Trunk Assessment   Upper Extremity Assessment Upper Extremity Assessment: Defer to OT evaluation    Lower Extremity Assessment Lower Extremity Assessment: Generalized weakness;LLE deficits/detail (decreased light touch R>L post-op, but improving) LLE Deficits / Details: anticipated post-op weakness; able to perform ankle pump, heel slide    Cervical / Trunk Assessment Cervical / Trunk Assessment: Normal  Communication   Communication: No difficulties  Cognition Arousal/Alertness: Awake/alert Behavior During Therapy: WFL for tasks assessed/performed Overall Cognitive Status: Within Functional Limits for tasks assessed                                          General Comments      Exercises General Exercises - Lower Extremity Ankle Circles/Pumps: AROM, Both, 5 reps, Supine   Assessment/Plan    PT Assessment Patient needs continued PT services  PT Problem List Decreased strength;Decreased mobility;Decreased range of motion;Decreased activity tolerance;Decreased balance;Decreased knowledge of use of DME;Pain;Decreased safety awareness       PT Treatment Interventions DME instruction;Therapeutic activities;Gait training;Therapeutic exercise;Patient/family education;Balance training;Stair training;Functional mobility training;Neuromuscular re-education    PT Goals (Current goals can be found in the Care Plan section)  Acute Rehab PT Goals Patient Stated Goal: home PT Goal Formulation: With patient Time For Goal Achievement: 03/06/22 Potential to Achieve Goals: Good    Frequency 7X/week     Co-evaluation               AM-PAC PT "6 Clicks" Mobility  Outcome Measure Help needed turning from your back to your side while in a flat bed without using bedrails?: A Little Help needed moving from lying on your back to sitting on the side of a flat bed without using bedrails?: A Little Help needed moving to and from a bed to a chair  (including a wheelchair)?: A Little Help needed standing up from a chair using your arms (e.g., wheelchair or bedside chair)?: A Little Help needed to walk in hospital room?: A Lot Help needed climbing 3-5 steps with a railing? : A Lot 6 Click Score: 16    End of Session   Activity Tolerance: Patient tolerated treatment well Patient left: in bed;with call bell/phone within reach;with bed alarm set Nurse Communication: Mobility status PT Visit Diagnosis: Other abnormalities of gait and mobility (R26.89);Muscle weakness (generalized) (M62.81)    Time: 1630-1650 PT Time Calculation (min) (ACUTE ONLY): 20 min   Charges:   PT Evaluation $PT Eval Low Complexity: 1 Low          Tanisha Lutes S, PT DPT Acute Rehabilitation Services Pager [phone]  Office [phone]  Roxine Caddy E Ruffin Pyo 02/27/2022, 5:16 PM

## 2022-02-28 ENCOUNTER — Encounter (HOSPITAL_COMMUNITY): Payer: Self-pay | Admitting: Orthopaedic Surgery

## 2022-02-28 DIAGNOSIS — Z23 Encounter for immunization: Secondary | ICD-10-CM | POA: Diagnosis not present

## 2022-02-28 DIAGNOSIS — E039 Hypothyroidism, unspecified: Secondary | ICD-10-CM | POA: Diagnosis not present

## 2022-02-28 DIAGNOSIS — Z79899 Other long term (current) drug therapy: Secondary | ICD-10-CM | POA: Diagnosis not present

## 2022-02-28 DIAGNOSIS — Z7982 Long term (current) use of aspirin: Secondary | ICD-10-CM | POA: Diagnosis not present

## 2022-02-28 DIAGNOSIS — M1612 Unilateral primary osteoarthritis, left hip: Secondary | ICD-10-CM | POA: Diagnosis not present

## 2022-02-28 DIAGNOSIS — R69 Illness, unspecified: Secondary | ICD-10-CM | POA: Diagnosis not present

## 2022-02-28 DIAGNOSIS — I10 Essential (primary) hypertension: Secondary | ICD-10-CM | POA: Diagnosis not present

## 2022-02-28 LAB — BASIC METABOLIC PANEL
Anion gap: 9 (ref 5–15)
BUN: 20 mg/dL (ref 8–23)
CO2: 26 mmol/L (ref 22–32)
Calcium: 9.2 mg/dL (ref 8.9–10.3)
Chloride: 101 mmol/L (ref 98–111)
Creatinine, Ser: 1.41 mg/dL — ABNORMAL HIGH (ref 0.44–1.00)
GFR, Estimated: 42 mL/min — ABNORMAL LOW (ref >60)
Glucose, Bld: 171 mg/dL — ABNORMAL HIGH (ref 70–99)
Potassium: 4.2 mmol/L (ref 3.5–5.1)
Sodium: 136 mmol/L (ref 135–145)

## 2022-02-28 LAB — CBC
HCT: 43.1 % (ref 36.0–46.0)
Hemoglobin: 13.8 g/dL (ref 12.0–15.0)
MCH: 28.9 pg (ref 26.0–34.0)
MCHC: 32 g/dL (ref 30.0–36.0)
MCV: 90.2 fL (ref 80.0–100.0)
Platelets: 196 10*3/uL (ref 150–400)
RBC: 4.78 MIL/uL (ref 3.87–5.11)
RDW: 15 % (ref 11.5–15.5)
WBC: 16.4 10*3/uL — ABNORMAL HIGH (ref 4.0–10.5)
nRBC: 0 % (ref 0.0–0.2)

## 2022-02-28 MED ORDER — METHOCARBAMOL 500 MG PO TABS: 500.0000 mg | ORAL_TABLET | Freq: Four times a day (QID) | ORAL | 1 refills | Status: DC | PRN

## 2022-02-28 MED ORDER — ASPIRIN 81 MG PO CHEW: 81.0000 mg | CHEWABLE_TABLET | Freq: Two times a day (BID) | ORAL | 0 refills | Status: AC

## 2022-02-28 MED ORDER — OXYCODONE HCL 5 MG PO TABS: 5.0000 mg | ORAL_TABLET | Freq: Four times a day (QID) | ORAL | 0 refills | Status: DC | PRN

## 2022-02-28 NOTE — Anesthesia Postprocedure Evaluation (Signed)
Anesthesia Post Note  Patient: [place]  Procedure(s) Performed: LEFT TOTAL HIP ARTHROPLASTY ANTERIOR APPROACH (Left: Hip)     Patient location during evaluation: PACU Anesthesia Type: Spinal Level of consciousness: awake and alert Pain management: pain level controlled Vital Signs Assessment: post-procedure vital signs reviewed and stable Respiratory status: spontaneous breathing and respiratory function stable Cardiovascular status: blood pressure returned to baseline and stable Postop Assessment: spinal receding and no apparent nausea or vomiting Anesthetic complications: no   No notable events documented.  Last Vitals:  Vitals:   02/27/22 1952 02/28/22 0746  BP: 120/68 125/69  Pulse: 62 72  Resp: 18   Temp: (!) 36.3 C   SpO2: 97% 95%    Last Pain:  Vitals:   02/28/22 0923  TempSrc:   PainSc: 0-No pain                 Audry Pili

## 2022-02-28 NOTE — Discharge Instructions (Signed)

## 2022-02-28 NOTE — Progress Notes (Signed)
Subjective: 1 Day Post-Op Procedure(s) (LRB): LEFT TOTAL HIP ARTHROPLASTY ANTERIOR APPROACH (Left) Patient reports pain as moderate.    Objective: Vital signs in last 24 hours: Temp:  [97.4 F (36.3 C)-97.9 F (36.6 C)] 97.4 F (36.3 C) (12/26 1952) Pulse Rate:  [55-68] 62 (12/26 1952) Resp:  [16-26] 18 (12/26 1952) BP: (91-125)/(51-95) 120/68 (12/26 1952) SpO2:  [96 %-100 %] 97 % (12/26 1952) Weight:  [109.8 kg] 109.8 kg (12/26 0919)  Intake/Output from previous day: 12/26 0701 - 12/27 0700 In: 200 [IV Piggyback:200] Out: 0932 [Urine:1300; Blood:250] Intake/Output this shift: No intake/output data recorded.  Recent Labs    02/28/22 0256  HGB 13.8   Recent Labs    02/28/22 0256  WBC 16.4*  RBC 4.78  HCT 43.1  PLT 196   Recent Labs    02/28/22 0256  NA 136  K 4.2  CL 101  CO2 26  BUN 20  CREATININE 1.41*  GLUCOSE 171*  CALCIUM 9.2   No results for input(s): "LABPT", "INR" in the last 72 hours.  Sensation intact distally Intact pulses distally   Assessment/Plan: 1 Day Post-Op Procedure(s) (LRB): LEFT TOTAL HIP ARTHROPLASTY ANTERIOR APPROACH (Left) Up with therapy      Mcarthur Rossetti 02/28/2022, 7:34 AM

## 2022-02-28 NOTE — Discharge Summary (Signed)
Patient ID: Kathy Herrera [facility] MRN: [id number] DOB/AGE: [birth year] 62 y.o.  Admit date: 02/27/2022 Discharge date: 02/28/2022  Admission Diagnoses:  Principal Problem:   Unilateral primary osteoarthritis, left hip Active Problems:   Status post total replacement of left hip   Discharge Diagnoses:  Same  Past Medical History:  Diagnosis Date   AAA (abdominal aortic aneurysm) (HCC)    Crohn disease ([place])    Depression    History. No current problems   DJD (degenerative joint disease) of lumbar spine    Dyspnea    With Activity   GERD (gastroesophageal reflux disease)    no current problems   Heart murmur    patient denies, states she was "diagnosed in [place], no follow up here"   Heart murmur    no current problems   Hyperlipidemia    Hypertension    Hyperthyroidism    Lactose intolerance    Perforated viscus 07/2015   Peripheral vascular disease ([place])    Rheumatoid arthritis ([place])    STD (sexually transmitted disease)    Condyloma   Thyroid disorder    Vitamin D deficiency     Surgeries: Procedure(s): LEFT TOTAL HIP ARTHROPLASTY ANTERIOR APPROACH on 02/27/2022   Consultants:   Discharged Condition: Improved  Hospital Course: Kathy Herrera is an 61 y.o. female who was admitted 02/27/2022 for operative treatment ofUnilateral primary osteoarthritis, left hip. Patient has severe unremitting pain that affects sleep, daily activities, and work/hobbies. After pre-op clearance the patient was taken to the operating room on 02/27/2022 and underwent  Procedure(s): LEFT TOTAL HIP ARTHROPLASTY ANTERIOR APPROACH.    Patient was given perioperative antibiotics:  Anti-infectives (From admission, onward)    Start     Dose/Rate Route Frequency Ordered Stop   02/27/22 2000  ceFAZolin (ANCEF) IVPB 1 g/50 mL premix        1 g 100 mL/hr over 30 Minutes Intravenous Every 6 hours 02/27/22 1543 02/28/22 0232   02/27/22 0915  ceFAZolin (ANCEF) IVPB 2g/100 mL premix        2  g 200 mL/hr over 30 Minutes Intravenous On call to O.R. 02/27/22 0910 02/27/22 1307        Patient was given sequential compression devices, early ambulation, and chemoprophylaxis to prevent DVT.  Patient benefited maximally from hospital stay and there were no complications.    Recent vital signs: Patient Vitals for the past 24 hrs:  BP Temp Temp src Pulse Resp SpO2  02/28/22 0746 125/69 -- -- 72 -- 95 %  02/27/22 1952 120/68 (!) 97.4 F (36.3 C) Oral 62 18 97 %  02/27/22 1556 117/79 -- -- (!) 55 20 100 %  02/27/22 1530 120/74 -- -- 66 (!) 26 100 %     Recent laboratory studies:  Recent Labs    02/28/22 0256  WBC 16.4*  HGB 13.8  HCT 43.1  PLT 196  NA 136  K 4.2  CL 101  CO2 26  BUN 20  CREATININE 1.41*  GLUCOSE 171*  CALCIUM 9.2     Discharge Medications:   Allergies as of 02/28/2022       Reactions   Shrimp [shellfish Allergy] Hives   May be seasoning not always   Percocet [oxycodone-acetaminophen] Nausea Only        Medication List     STOP taking these medications    aspirin EC 81 MG tablet Replaced by: aspirin 81 MG chewable tablet       TAKE these medications  acetaminophen 500 MG tablet Commonly known as: TYLENOL Take 1,000 mg by mouth every 6 (six) hours as needed for moderate pain.   albuterol 108 (90 Base) MCG/ACT inhaler Commonly known as: VENTOLIN HFA Inhale 2 puffs into the lungs every 6 (six) hours as needed for wheezing or shortness of breath.   aspirin 81 MG chewable tablet Chew 1 tablet (81 mg total) by mouth 2 (two) times daily. Replaces: aspirin EC 81 MG tablet   atenolol 100 MG tablet Commonly known as: TENORMIN TAKE 1 TABLET BY MOUTH DAILY.   cholecalciferol 25 MCG (1000 UNIT) tablet Commonly known as: VITAMIN D3 Take 1,000 Units by mouth daily.   DULoxetine 20 MG capsule Commonly known as: CYMBALTA Take 1 capsule (20 mg total) by mouth daily. What changed:  when to take this reasons to take this    hydrochlorothiazide 25 MG tablet Commonly known as: HYDRODIURIL TAKE 1 TABLET EVERY DAY   lisinopril 40 MG tablet Commonly known as: ZESTRIL TAKE 1 TABLET EVERY DAY   MELATONIN PO Take 1 tablet by mouth at bedtime as needed (sleep).   methocarbamol 500 MG tablet Commonly known as: ROBAXIN Take 1 tablet (500 mg total) by mouth every 6 (six) hours as needed for muscle spasms.   multivitamin with minerals tablet Take 1 tablet by mouth daily.   oxyCODONE 5 MG immediate release tablet Commonly known as: Oxy IR/ROXICODONE Take 1-2 tablets (5-10 mg total) by mouth every 6 (six) hours as needed for moderate pain (pain score 4-6). No more than 6 tablets per day.   potassium chloride 10 MEQ tablet Commonly known as: KLOR-CON M Take 1 tablet (10 mEq total) by mouth daily.   potassium chloride 10 MEQ tablet Commonly known as: KLOR-CON Take 10 mEq by mouth daily.   rosuvastatin 10 MG tablet Commonly known as: CRESTOR TAKE 1 TABLET EVERY DAY   traZODone 50 MG tablet Commonly known as: DESYREL Take 50 mg by mouth at bedtime as needed for sleep.   vitamin B-12 100 MCG tablet Commonly known as: CYANOCOBALAMIN Take 100 mcg by mouth daily.               Durable Medical Equipment  (From admission, onward)           Start     Ordered   02/27/22 1544  DME 3 n 1  Once        02/27/22 1543   02/27/22 1544  DME Walker rolling  Once       Question Answer Comment  Walker: With 5 Inch Wheels   Patient needs a walker to treat with the following condition Status post total replacement of left hip      02/27/22 1543            Diagnostic Studies: DG HIP UNILAT WITH PELVIS 2-3 VIEWS LEFT  Result Date: 02/27/2022 CLINICAL DATA:  Left hip replacement. EXAM: DG HIP (WITH OR WITHOUT PELVIS) 2-3V LEFT COMPARISON:  Left hip x-rays dated December 28, 2021. FLUOROSCOPY TIME:  Radiation Exposure Index (as provided by the fluoroscopic device): 4.29 mGy Kerma C-arm fluoroscopic  images were obtained intraoperatively and submitted for post operative interpretation. FINDINGS: Multiple intraoperative fluoroscopic images demonstrate interval left total hip arthroplasty. Components are well aligned. No acute osseous abnormality. IMPRESSION: 1. Intraoperative fluoroscopic guidance for left total hip arthroplasty. Electronically Signed   By: Titus Dubin M.D.   On: 02/27/2022 15:21   DG Pelvis Portable  Result Date: 02/27/2022 CLINICAL DATA:  Left hip  replacement. EXAM: PORTABLE PELVIS 1-2 VIEWS COMPARISON:  Left hip x-rays dated December 28, 2021. FINDINGS: The left hip demonstrates a total arthroplasty without evidence of hardware failure or complication. There is no fracture or dislocation. The alignment is anatomic. Post-surgical changes noted in the surrounding soft tissues. IMPRESSION: 1. Left total hip arthroplasty without acute postoperative complication. Electronically Signed   By: Titus Dubin M.D.   On: 02/27/2022 15:18   DG C-Arm 1-60 Min-No Report  Result Date: 02/27/2022 Fluoroscopy was utilized by the requesting physician.  No radiographic interpretation.   DG C-Arm 1-60 Min-No Report  Result Date: 02/27/2022 Fluoroscopy was utilized by the requesting physician.  No radiographic interpretation.    Disposition: Discharge disposition: 01-Home or Self Care          Follow-up Information     Mcarthur Rossetti, MD Follow up in 2 week(s).   Specialty: Orthopedic Surgery Contact information: [place] [address] [address], [place] Follow up.   Specialty: [place] Why: home health services will be provided by [facility], start of care  within 48 hours post discharge Contact information: [address] [place] [address] [phone]         Libby Maw, MD Follow up.   Specialty: Family Medicine Contact information: [place] [address] [phone]                  Signed: Mcarthur Rossetti 02/28/2022, 3:29 PM

## 2022-02-28 NOTE — TOC Transition Note (Signed)
Transition of Care [facility]) - CM/SW Discharge Note   Patient Details  Name: Kathy Herrera MRN: [id number] Date of Birth: 1960/11/01  Transition of [facility]) CM/SW Contact:  Sharin Mons, RN Phone Number: 02/28/2022, 1:31 PM   Clinical Narrative:    Patient will DC to: home Anticipated DC date: 02/28/2022 Family notified: yes Transport by: car        - s/p L DA-THA on 12/26  Per MD patient ready for DC today. RN, patient, patient's family, and [facility] notified of DC. [place] prearranged by provider's office prior to hospital visit. Daughter in law to assist with care once d/c. Pt without DME needs. Declined RW and BSC. Pt without RX med concerns.  Post hospital f/u noted on AVS. Son to provide transportation to home.  RNCM will sign off for now as intervention is no longer needed. Please consult [place] again if new needs arise.   Final next level of care: [place] Barriers to Discharge: No Barriers Identified   Patient Goals and CMS Choice      Discharge Placement                         Discharge Plan and Services Additional resources added to the After Visit Summary for                            [facility] Arranged: PT HH Agency: [place] Date [place]: 02/28/22      Social Determinants of Health ([place]) Interventions SDOH Screenings   Food Insecurity: No Food Insecurity (02/27/2022)  Housing: Low Risk  (02/27/2022)  Transportation Needs: No Transportation Needs (02/27/2022)  Utilities: Not At Risk (02/27/2022)  Alcohol Screen: Low Risk  (02/08/2021)  Depression (PHQ2-9): Low Risk  (02/12/2022)  Financial Resource Strain: Low Risk  (02/12/2022)  Physical Activity: Insufficiently Active (02/12/2022)  Social Connections: Moderately Isolated (02/08/2021)  Stress: No Stress Concern Present (02/12/2022)  Tobacco Use: High Risk (02/28/2022)     Readmission Risk Interventions     No data to display

## 2022-02-28 NOTE — Plan of Care (Signed)
  Problem: Education: Goal: Knowledge of General Education information will improve Description: Including pain rating scale, medication(s)/side effects and non-pharmacologic comfort measures Outcome: Adequate for Discharge   Problem: Health Behavior/Discharge Planning: Goal: Ability to manage health-related needs will improve Outcome: Adequate for Discharge   Problem: Clinical Measurements: Goal: Ability to maintain clinical measurements within normal limits will improve Outcome: Adequate for Discharge Goal: Will remain free from infection Outcome: Adequate for Discharge Goal: Diagnostic test results will improve Outcome: Adequate for Discharge Goal: Respiratory complications will improve Outcome: Adequate for Discharge Goal: Cardiovascular complication will be avoided Outcome: Adequate for Discharge   Problem: Activity: Goal: Risk for activity intolerance will decrease Outcome: Adequate for Discharge   Problem: Nutrition: Goal: Adequate nutrition will be maintained Outcome: Adequate for Discharge   Problem: Coping: Goal: Level of anxiety will decrease Outcome: Adequate for Discharge   Problem: Elimination: Goal: Will not experience complications related to bowel motility Outcome: Adequate for Discharge Goal: Will not experience complications related to urinary retention Outcome: Adequate for Discharge   Problem: Pain Managment: Goal: General experience of comfort will improve Outcome: Adequate for Discharge   Problem: Safety: Goal: Ability to remain free from injury will improve Outcome: Adequate for Discharge   Problem: Skin Integrity: Goal: Risk for impaired skin integrity will decrease Outcome: Adequate for Discharge   Problem: Education: Goal: Knowledge of the prescribed therapeutic regimen will improve Outcome: Adequate for Discharge Goal: Understanding of discharge needs will improve Outcome: Adequate for Discharge Goal: Individualized Educational  Video(s) Outcome: Adequate for Discharge   Problem: Activity: Goal: Ability to avoid complications of mobility impairment will improve Outcome: Adequate for Discharge Goal: Ability to tolerate increased activity will improve Outcome: Adequate for Discharge   Problem: Clinical Measurements: Goal: Postoperative complications will be avoided or minimized Outcome: Adequate for Discharge   Problem: Pain Management: Goal: Pain level will decrease with appropriate interventions Outcome: Adequate for Discharge   Problem: Skin Integrity: Goal: Will show signs of wound healing Outcome: Adequate for Discharge   Problem: Acute Rehab PT Goals(only PT should resolve) Goal: Pt Will Go Supine/Side To Sit Outcome: Adequate for Discharge Goal: Patient Will Transfer Sit To/From Stand Outcome: Adequate for Discharge Goal: Pt Will Ambulate Outcome: Adequate for Discharge Goal: Pt Will Go Up/Down Stairs Outcome: Adequate for Discharge Goal: Pt/caregiver will Perform Home Exercise Program Outcome: Adequate for Discharge

## 2022-02-28 NOTE — Progress Notes (Signed)
Explained discharge instructions to patient. Reviewed follow up appointment and next medication administration times. Also reviewed education. Patient verbalized having an understanding for instructions given. All belongings are in the patient's possession. IV removed. No other needs verbalized. Transported downstairs for discharge.

## 2022-02-28 NOTE — Progress Notes (Signed)
Patient ID: Kathy Herrera [facility], female   DOB: 1960/08/18, 61 y.o.   MRN: [id number] She has not been up yet today with therapy.  However both the patient and nursing her, she will be able to be discharged to home later today.  I will put in the orders for that.

## 2022-02-28 NOTE — Progress Notes (Signed)
Physical Therapy Treatment Patient Details Name: Kathy Herrera MRN: [id number] DOB: 09/07/1960 Today's Date: 02/28/2022   History of Present Illness 61 yo female s/p L DA-THA on 12/26. PMH includes AAA s/p stent 2022, crohn's disease, obesity, depression, HTN, HLD, lumbar fusion 2015.    PT Comments    Pt demonstrates improvement from initial evaluation. Pt was able to ambulate 100 ft with AD this session at supervision level. She requires increased time to perform activities and stated that movement and WB hurt less than she was anticipating from yesterday. Currently recommending skilled physical therapy services in HHPT setting on discharge from acute care hospital in order to return to PLOF and decrease risk for falls. Pt demonstrates no signs/symptoms of cardiac or respiratory distress throughout session.    Recommendations for follow up therapy are one component of a multi-disciplinary discharge planning process, led by the attending physician.  Recommendations may be updated based on patient status, additional functional criteria and insurance authorization.  Follow Up Recommendations  Follow physician's recommendations for discharge plan and follow up therapies     Assistance Recommended at Discharge PRN  Patient can return home with the following A little help with walking and/or transfers;Help with stairs or ramp for entrance;Assist for transportation;Assistance with cooking/housework   Equipment Recommendations  None recommended by PT    Recommendations for Other Services       Precautions / Restrictions Precautions Precautions: Fall Restrictions Weight Bearing Restrictions: Yes LLE Weight Bearing: Weight bearing as tolerated     Mobility  Bed Mobility               General bed mobility comments: Pt received in recliner and returned to sitting EOB with case management in room. Pt stats she may sleep in recliner at home if she has difficulty with the  bed. Patient Response: Cooperative  Transfers Overall transfer level: Needs assistance Equipment used: Rolling walker (2 wheels) Transfers: Sit to/from Stand, Bed to chair/wheelchair/BSC Sit to Stand: Supervision   Step pivot transfers: Supervision       General transfer comment: Pt requires extra time but is able to perform sit to stand and transfers at supervision level due to slight instability    Ambulation/Gait Ambulation/Gait assistance: Supervision Gait Distance (Feet): 100 Feet Assistive device: Rolling walker (2 wheels) Gait Pattern/deviations: Step-through pattern, Decreased step length - right, Decreased step length - left, Decreased stance time - left, Antalgic   Gait velocity interpretation: <1.8 ft/sec, indicate of risk for recurrent falls   General Gait Details: Low foot clearance and decreased cadence, requires occasional standing rest breaks.   Stairs Stairs:  (Discussed stair navigation with pt. She has 1 step to get into house pt is able to state from memory up with the non affected leg down with the affected leg. States she learned this in PT a while ago when she had back issues.)           Wheelchair Mobility    Modified Rankin (Stroke Patients Only)       Balance Overall balance assessment: Needs assistance Sitting-balance support: No upper extremity supported, Feet supported Sitting balance-Leahy Scale: Good     Standing balance support: Bilateral upper extremity supported, During functional activity, Reliant on assistive device for balance Standing balance-Leahy Scale: Fair          Cognition Arousal/Alertness: Awake/alert Behavior During Therapy: WFL for tasks assessed/performed Overall Cognitive Status: Within Functional Limits for tasks assessed  Pertinent Vitals/Pain Pain Assessment Pain Assessment: Faces Faces Pain Scale: Hurts a little bit Pain Descriptors / Indicators: Sore Pain Intervention(s):  Monitored during session     PT Goals (current goals can now be found in the care plan section) Acute Rehab PT Goals Patient Stated Goal: home PT Goal Formulation: With patient Time For Goal Achievement: 03/06/22 Potential to Achieve Goals: Good Progress towards PT goals: Progressing toward goals    Frequency    7X/week      PT Plan Current plan remains appropriate       AM-PAC PT "6 Clicks" Mobility   Outcome Measure  Help needed turning from your back to your side while in a flat bed without using bedrails?: A Little Help needed moving from lying on your back to sitting on the side of a flat bed without using bedrails?: A Little Help needed moving to and from a bed to a chair (including a wheelchair)?: A Little Help needed standing up from a chair using your arms (e.g., wheelchair or bedside chair)?: A Little Help needed to walk in hospital room?: A Little Help needed climbing 3-5 steps with a railing? : A Little 6 Click Score: 18    End of Session Equipment Utilized During Treatment: Gait belt Activity Tolerance: Patient tolerated treatment well Patient left: in bed;with call bell/phone within reach;with family/visitor present Nurse Communication: Mobility status PT Visit Diagnosis: Other abnormalities of gait and mobility (R26.89);Muscle weakness (generalized) (M62.81)     Time: 3825-0539 PT Time Calculation (min) (ACUTE ONLY): 16 min  Charges:  $Gait Training: 8-22 mins                     Tomma Rakers, DPT, [place] Office: [phone] (Secure chat preferred)    Ander Purpura 02/28/2022, 12:23 PM

## 2022-03-01 ENCOUNTER — Ambulatory Visit: Payer: Medicare HMO | Admitting: Orthopedic Surgery

## 2022-03-01 ENCOUNTER — Telehealth: Payer: Self-pay | Admitting: *Deleted

## 2022-03-01 DIAGNOSIS — E785 Hyperlipidemia, unspecified: Secondary | ICD-10-CM | POA: Diagnosis not present

## 2022-03-01 DIAGNOSIS — E559 Vitamin D deficiency, unspecified: Secondary | ICD-10-CM | POA: Diagnosis not present

## 2022-03-01 DIAGNOSIS — I2510 Atherosclerotic heart disease of native coronary artery without angina pectoris: Secondary | ICD-10-CM | POA: Diagnosis not present

## 2022-03-01 DIAGNOSIS — M48062 Spinal stenosis, lumbar region with neurogenic claudication: Secondary | ICD-10-CM | POA: Diagnosis not present

## 2022-03-01 DIAGNOSIS — K5090 Crohn's disease, unspecified, without complications: Secondary | ICD-10-CM | POA: Diagnosis not present

## 2022-03-01 DIAGNOSIS — Z7982 Long term (current) use of aspirin: Secondary | ICD-10-CM | POA: Diagnosis not present

## 2022-03-01 DIAGNOSIS — E538 Deficiency of other specified B group vitamins: Secondary | ICD-10-CM | POA: Diagnosis not present

## 2022-03-01 DIAGNOSIS — I10 Essential (primary) hypertension: Secondary | ICD-10-CM | POA: Diagnosis not present

## 2022-03-01 DIAGNOSIS — E039 Hypothyroidism, unspecified: Secondary | ICD-10-CM | POA: Diagnosis not present

## 2022-03-01 DIAGNOSIS — I739 Peripheral vascular disease, unspecified: Secondary | ICD-10-CM | POA: Diagnosis not present

## 2022-03-01 DIAGNOSIS — Z6839 Body mass index (BMI) 39.0-39.9, adult: Secondary | ICD-10-CM | POA: Diagnosis not present

## 2022-03-01 DIAGNOSIS — Z96642 Presence of left artificial hip joint: Secondary | ICD-10-CM | POA: Diagnosis not present

## 2022-03-01 DIAGNOSIS — Z86010 Personal history of colonic polyps: Secondary | ICD-10-CM | POA: Diagnosis not present

## 2022-03-01 DIAGNOSIS — R69 Illness, unspecified: Secondary | ICD-10-CM | POA: Diagnosis not present

## 2022-03-01 DIAGNOSIS — Z471 Aftercare following joint replacement surgery: Secondary | ICD-10-CM | POA: Diagnosis not present

## 2022-03-01 DIAGNOSIS — M5136 Other intervertebral disc degeneration, lumbar region: Secondary | ICD-10-CM | POA: Diagnosis not present

## 2022-03-01 DIAGNOSIS — M069 Rheumatoid arthritis, unspecified: Secondary | ICD-10-CM | POA: Diagnosis not present

## 2022-03-01 DIAGNOSIS — F17210 Nicotine dependence, cigarettes, uncomplicated: Secondary | ICD-10-CM | POA: Diagnosis not present

## 2022-03-01 DIAGNOSIS — L280 Lichen simplex chronicus: Secondary | ICD-10-CM | POA: Diagnosis not present

## 2022-03-01 DIAGNOSIS — K219 Gastro-esophageal reflux disease without esophagitis: Secondary | ICD-10-CM | POA: Diagnosis not present

## 2022-03-01 DIAGNOSIS — M4316 Spondylolisthesis, lumbar region: Secondary | ICD-10-CM | POA: Diagnosis not present

## 2022-03-01 DIAGNOSIS — I723 Aneurysm of iliac artery: Secondary | ICD-10-CM | POA: Diagnosis not present

## 2022-03-01 DIAGNOSIS — I714 Abdominal aortic aneurysm, without rupture, unspecified: Secondary | ICD-10-CM | POA: Diagnosis not present

## 2022-03-01 NOTE — Telephone Encounter (Signed)
Ortho bundle D/C call completed. 

## 2022-03-03 DIAGNOSIS — L280 Lichen simplex chronicus: Secondary | ICD-10-CM | POA: Diagnosis not present

## 2022-03-03 DIAGNOSIS — E785 Hyperlipidemia, unspecified: Secondary | ICD-10-CM | POA: Diagnosis not present

## 2022-03-03 DIAGNOSIS — Z96642 Presence of left artificial hip joint: Secondary | ICD-10-CM | POA: Diagnosis not present

## 2022-03-03 DIAGNOSIS — E538 Deficiency of other specified B group vitamins: Secondary | ICD-10-CM | POA: Diagnosis not present

## 2022-03-03 DIAGNOSIS — M069 Rheumatoid arthritis, unspecified: Secondary | ICD-10-CM | POA: Diagnosis not present

## 2022-03-03 DIAGNOSIS — Z7982 Long term (current) use of aspirin: Secondary | ICD-10-CM | POA: Diagnosis not present

## 2022-03-03 DIAGNOSIS — M5136 Other intervertebral disc degeneration, lumbar region: Secondary | ICD-10-CM | POA: Diagnosis not present

## 2022-03-03 DIAGNOSIS — E559 Vitamin D deficiency, unspecified: Secondary | ICD-10-CM | POA: Diagnosis not present

## 2022-03-03 DIAGNOSIS — Z6839 Body mass index (BMI) 39.0-39.9, adult: Secondary | ICD-10-CM | POA: Diagnosis not present

## 2022-03-03 DIAGNOSIS — I723 Aneurysm of iliac artery: Secondary | ICD-10-CM | POA: Diagnosis not present

## 2022-03-03 DIAGNOSIS — R69 Illness, unspecified: Secondary | ICD-10-CM | POA: Diagnosis not present

## 2022-03-03 DIAGNOSIS — I2510 Atherosclerotic heart disease of native coronary artery without angina pectoris: Secondary | ICD-10-CM | POA: Diagnosis not present

## 2022-03-03 DIAGNOSIS — Z471 Aftercare following joint replacement surgery: Secondary | ICD-10-CM | POA: Diagnosis not present

## 2022-03-03 DIAGNOSIS — E039 Hypothyroidism, unspecified: Secondary | ICD-10-CM | POA: Diagnosis not present

## 2022-03-03 DIAGNOSIS — I10 Essential (primary) hypertension: Secondary | ICD-10-CM | POA: Diagnosis not present

## 2022-03-03 DIAGNOSIS — K5090 Crohn's disease, unspecified, without complications: Secondary | ICD-10-CM | POA: Diagnosis not present

## 2022-03-03 DIAGNOSIS — K219 Gastro-esophageal reflux disease without esophagitis: Secondary | ICD-10-CM | POA: Diagnosis not present

## 2022-03-03 DIAGNOSIS — M4316 Spondylolisthesis, lumbar region: Secondary | ICD-10-CM | POA: Diagnosis not present

## 2022-03-03 DIAGNOSIS — Z86010 Personal history of colonic polyps: Secondary | ICD-10-CM | POA: Diagnosis not present

## 2022-03-03 DIAGNOSIS — I739 Peripheral vascular disease, unspecified: Secondary | ICD-10-CM | POA: Diagnosis not present

## 2022-03-03 DIAGNOSIS — F17210 Nicotine dependence, cigarettes, uncomplicated: Secondary | ICD-10-CM | POA: Diagnosis not present

## 2022-03-03 DIAGNOSIS — I714 Abdominal aortic aneurysm, without rupture, unspecified: Secondary | ICD-10-CM | POA: Diagnosis not present

## 2022-03-03 DIAGNOSIS — M48062 Spinal stenosis, lumbar region with neurogenic claudication: Secondary | ICD-10-CM | POA: Diagnosis not present

## 2022-03-06 DIAGNOSIS — Z471 Aftercare following joint replacement surgery: Secondary | ICD-10-CM | POA: Diagnosis not present

## 2022-03-06 DIAGNOSIS — I714 Abdominal aortic aneurysm, without rupture, unspecified: Secondary | ICD-10-CM | POA: Diagnosis not present

## 2022-03-06 DIAGNOSIS — M5136 Other intervertebral disc degeneration, lumbar region: Secondary | ICD-10-CM | POA: Diagnosis not present

## 2022-03-06 DIAGNOSIS — M4316 Spondylolisthesis, lumbar region: Secondary | ICD-10-CM | POA: Diagnosis not present

## 2022-03-06 DIAGNOSIS — M069 Rheumatoid arthritis, unspecified: Secondary | ICD-10-CM | POA: Diagnosis not present

## 2022-03-06 DIAGNOSIS — E559 Vitamin D deficiency, unspecified: Secondary | ICD-10-CM | POA: Diagnosis not present

## 2022-03-06 DIAGNOSIS — I739 Peripheral vascular disease, unspecified: Secondary | ICD-10-CM | POA: Diagnosis not present

## 2022-03-06 DIAGNOSIS — M48062 Spinal stenosis, lumbar region with neurogenic claudication: Secondary | ICD-10-CM | POA: Diagnosis not present

## 2022-03-06 DIAGNOSIS — Z96642 Presence of left artificial hip joint: Secondary | ICD-10-CM | POA: Diagnosis not present

## 2022-03-06 DIAGNOSIS — Z6839 Body mass index (BMI) 39.0-39.9, adult: Secondary | ICD-10-CM | POA: Diagnosis not present

## 2022-03-06 DIAGNOSIS — Z7982 Long term (current) use of aspirin: Secondary | ICD-10-CM | POA: Diagnosis not present

## 2022-03-06 DIAGNOSIS — F17210 Nicotine dependence, cigarettes, uncomplicated: Secondary | ICD-10-CM | POA: Diagnosis not present

## 2022-03-06 DIAGNOSIS — I723 Aneurysm of iliac artery: Secondary | ICD-10-CM | POA: Diagnosis not present

## 2022-03-06 DIAGNOSIS — E039 Hypothyroidism, unspecified: Secondary | ICD-10-CM | POA: Diagnosis not present

## 2022-03-06 DIAGNOSIS — K5090 Crohn's disease, unspecified, without complications: Secondary | ICD-10-CM | POA: Diagnosis not present

## 2022-03-06 DIAGNOSIS — L280 Lichen simplex chronicus: Secondary | ICD-10-CM | POA: Diagnosis not present

## 2022-03-06 DIAGNOSIS — I2510 Atherosclerotic heart disease of native coronary artery without angina pectoris: Secondary | ICD-10-CM | POA: Diagnosis not present

## 2022-03-06 DIAGNOSIS — R69 Illness, unspecified: Secondary | ICD-10-CM | POA: Diagnosis not present

## 2022-03-06 DIAGNOSIS — K219 Gastro-esophageal reflux disease without esophagitis: Secondary | ICD-10-CM | POA: Diagnosis not present

## 2022-03-06 DIAGNOSIS — E785 Hyperlipidemia, unspecified: Secondary | ICD-10-CM | POA: Diagnosis not present

## 2022-03-06 DIAGNOSIS — I10 Essential (primary) hypertension: Secondary | ICD-10-CM | POA: Diagnosis not present

## 2022-03-06 DIAGNOSIS — Z86010 Personal history of colonic polyps: Secondary | ICD-10-CM | POA: Diagnosis not present

## 2022-03-06 DIAGNOSIS — E538 Deficiency of other specified B group vitamins: Secondary | ICD-10-CM | POA: Diagnosis not present

## 2022-03-07 ENCOUNTER — Telehealth: Payer: Self-pay | Admitting: *Deleted

## 2022-03-07 NOTE — Telephone Encounter (Signed)
Ortho bundle 7 day call completed. 

## 2022-03-08 DIAGNOSIS — F17210 Nicotine dependence, cigarettes, uncomplicated: Secondary | ICD-10-CM | POA: Diagnosis not present

## 2022-03-08 DIAGNOSIS — E039 Hypothyroidism, unspecified: Secondary | ICD-10-CM | POA: Diagnosis not present

## 2022-03-08 DIAGNOSIS — M4316 Spondylolisthesis, lumbar region: Secondary | ICD-10-CM | POA: Diagnosis not present

## 2022-03-08 DIAGNOSIS — I739 Peripheral vascular disease, unspecified: Secondary | ICD-10-CM | POA: Diagnosis not present

## 2022-03-08 DIAGNOSIS — I714 Abdominal aortic aneurysm, without rupture, unspecified: Secondary | ICD-10-CM | POA: Diagnosis not present

## 2022-03-08 DIAGNOSIS — Z8249 Family history of ischemic heart disease and other diseases of the circulatory system: Secondary | ICD-10-CM | POA: Diagnosis not present

## 2022-03-08 DIAGNOSIS — I723 Aneurysm of iliac artery: Secondary | ICD-10-CM | POA: Diagnosis not present

## 2022-03-08 DIAGNOSIS — I2510 Atherosclerotic heart disease of native coronary artery without angina pectoris: Secondary | ICD-10-CM | POA: Diagnosis not present

## 2022-03-08 DIAGNOSIS — R69 Illness, unspecified: Secondary | ICD-10-CM | POA: Diagnosis not present

## 2022-03-08 DIAGNOSIS — F17200 Nicotine dependence, unspecified, uncomplicated: Secondary | ICD-10-CM | POA: Diagnosis not present

## 2022-03-08 DIAGNOSIS — K219 Gastro-esophageal reflux disease without esophagitis: Secondary | ICD-10-CM | POA: Diagnosis not present

## 2022-03-08 DIAGNOSIS — M069 Rheumatoid arthritis, unspecified: Secondary | ICD-10-CM | POA: Diagnosis not present

## 2022-03-08 DIAGNOSIS — E559 Vitamin D deficiency, unspecified: Secondary | ICD-10-CM | POA: Diagnosis not present

## 2022-03-08 DIAGNOSIS — Z96649 Presence of unspecified artificial hip joint: Secondary | ICD-10-CM | POA: Diagnosis not present

## 2022-03-08 DIAGNOSIS — L280 Lichen simplex chronicus: Secondary | ICD-10-CM | POA: Diagnosis not present

## 2022-03-08 DIAGNOSIS — E538 Deficiency of other specified B group vitamins: Secondary | ICD-10-CM | POA: Diagnosis not present

## 2022-03-08 DIAGNOSIS — Z86010 Personal history of colonic polyps: Secondary | ICD-10-CM | POA: Diagnosis not present

## 2022-03-08 DIAGNOSIS — Z471 Aftercare following joint replacement surgery: Secondary | ICD-10-CM | POA: Diagnosis not present

## 2022-03-08 DIAGNOSIS — Z6839 Body mass index (BMI) 39.0-39.9, adult: Secondary | ICD-10-CM | POA: Diagnosis not present

## 2022-03-08 DIAGNOSIS — K5090 Crohn's disease, unspecified, without complications: Secondary | ICD-10-CM | POA: Diagnosis not present

## 2022-03-08 DIAGNOSIS — E785 Hyperlipidemia, unspecified: Secondary | ICD-10-CM | POA: Diagnosis not present

## 2022-03-08 DIAGNOSIS — H269 Unspecified cataract: Secondary | ICD-10-CM | POA: Diagnosis not present

## 2022-03-08 DIAGNOSIS — M1990 Unspecified osteoarthritis, unspecified site: Secondary | ICD-10-CM | POA: Diagnosis not present

## 2022-03-08 DIAGNOSIS — R269 Unspecified abnormalities of gait and mobility: Secondary | ICD-10-CM | POA: Diagnosis not present

## 2022-03-08 DIAGNOSIS — Z7982 Long term (current) use of aspirin: Secondary | ICD-10-CM | POA: Diagnosis not present

## 2022-03-08 DIAGNOSIS — Z96642 Presence of left artificial hip joint: Secondary | ICD-10-CM | POA: Diagnosis not present

## 2022-03-08 DIAGNOSIS — Z008 Encounter for other general examination: Secondary | ICD-10-CM | POA: Diagnosis not present

## 2022-03-08 DIAGNOSIS — M5136 Other intervertebral disc degeneration, lumbar region: Secondary | ICD-10-CM | POA: Diagnosis not present

## 2022-03-08 DIAGNOSIS — M48062 Spinal stenosis, lumbar region with neurogenic claudication: Secondary | ICD-10-CM | POA: Diagnosis not present

## 2022-03-08 DIAGNOSIS — R32 Unspecified urinary incontinence: Secondary | ICD-10-CM | POA: Diagnosis not present

## 2022-03-08 DIAGNOSIS — I10 Essential (primary) hypertension: Secondary | ICD-10-CM | POA: Diagnosis not present

## 2022-03-12 ENCOUNTER — Telehealth: Payer: Self-pay | Admitting: Orthopaedic Surgery

## 2022-03-12 ENCOUNTER — Other Ambulatory Visit: Payer: Self-pay | Admitting: Orthopaedic Surgery

## 2022-03-12 DIAGNOSIS — Z471 Aftercare following joint replacement surgery: Secondary | ICD-10-CM | POA: Diagnosis not present

## 2022-03-12 DIAGNOSIS — Z96642 Presence of left artificial hip joint: Secondary | ICD-10-CM | POA: Diagnosis not present

## 2022-03-12 DIAGNOSIS — I714 Abdominal aortic aneurysm, without rupture, unspecified: Secondary | ICD-10-CM | POA: Diagnosis not present

## 2022-03-12 DIAGNOSIS — R69 Illness, unspecified: Secondary | ICD-10-CM | POA: Diagnosis not present

## 2022-03-12 DIAGNOSIS — I723 Aneurysm of iliac artery: Secondary | ICD-10-CM | POA: Diagnosis not present

## 2022-03-12 DIAGNOSIS — Z6839 Body mass index (BMI) 39.0-39.9, adult: Secondary | ICD-10-CM | POA: Diagnosis not present

## 2022-03-12 DIAGNOSIS — M069 Rheumatoid arthritis, unspecified: Secondary | ICD-10-CM | POA: Diagnosis not present

## 2022-03-12 DIAGNOSIS — E538 Deficiency of other specified B group vitamins: Secondary | ICD-10-CM | POA: Diagnosis not present

## 2022-03-12 DIAGNOSIS — I2510 Atherosclerotic heart disease of native coronary artery without angina pectoris: Secondary | ICD-10-CM | POA: Diagnosis not present

## 2022-03-12 DIAGNOSIS — M5136 Other intervertebral disc degeneration, lumbar region: Secondary | ICD-10-CM | POA: Diagnosis not present

## 2022-03-12 DIAGNOSIS — E039 Hypothyroidism, unspecified: Secondary | ICD-10-CM | POA: Diagnosis not present

## 2022-03-12 DIAGNOSIS — F17210 Nicotine dependence, cigarettes, uncomplicated: Secondary | ICD-10-CM | POA: Diagnosis not present

## 2022-03-12 DIAGNOSIS — M4316 Spondylolisthesis, lumbar region: Secondary | ICD-10-CM | POA: Diagnosis not present

## 2022-03-12 DIAGNOSIS — K5090 Crohn's disease, unspecified, without complications: Secondary | ICD-10-CM | POA: Diagnosis not present

## 2022-03-12 DIAGNOSIS — L280 Lichen simplex chronicus: Secondary | ICD-10-CM | POA: Diagnosis not present

## 2022-03-12 DIAGNOSIS — K219 Gastro-esophageal reflux disease without esophagitis: Secondary | ICD-10-CM | POA: Diagnosis not present

## 2022-03-12 DIAGNOSIS — E785 Hyperlipidemia, unspecified: Secondary | ICD-10-CM | POA: Diagnosis not present

## 2022-03-12 DIAGNOSIS — Z86010 Personal history of colonic polyps: Secondary | ICD-10-CM | POA: Diagnosis not present

## 2022-03-12 DIAGNOSIS — I739 Peripheral vascular disease, unspecified: Secondary | ICD-10-CM | POA: Diagnosis not present

## 2022-03-12 DIAGNOSIS — I10 Essential (primary) hypertension: Secondary | ICD-10-CM | POA: Diagnosis not present

## 2022-03-12 DIAGNOSIS — E559 Vitamin D deficiency, unspecified: Secondary | ICD-10-CM | POA: Diagnosis not present

## 2022-03-12 DIAGNOSIS — M48062 Spinal stenosis, lumbar region with neurogenic claudication: Secondary | ICD-10-CM | POA: Diagnosis not present

## 2022-03-12 DIAGNOSIS — Z7982 Long term (current) use of aspirin: Secondary | ICD-10-CM | POA: Diagnosis not present

## 2022-03-12 MED ORDER — OXYCODONE HCL 5 MG PO TABS: 5.0000 mg | ORAL_TABLET | Freq: Four times a day (QID) | ORAL | 0 refills | Status: DC | PRN

## 2022-03-12 NOTE — Telephone Encounter (Signed)
Pt called requesting a refill of oxycodone. Please send to pharmacy on file. Pt phone number is [phone].

## 2022-03-13 ENCOUNTER — Ambulatory Visit (INDEPENDENT_AMBULATORY_CARE_PROVIDER_SITE_OTHER): Payer: Medicare HMO | Admitting: Orthopaedic Surgery

## 2022-03-13 ENCOUNTER — Telehealth: Payer: Self-pay | Admitting: *Deleted

## 2022-03-13 ENCOUNTER — Encounter: Payer: Self-pay | Admitting: Orthopaedic Surgery

## 2022-03-13 ENCOUNTER — Ambulatory Visit (INDEPENDENT_AMBULATORY_CARE_PROVIDER_SITE_OTHER): Payer: Medicare HMO

## 2022-03-13 DIAGNOSIS — Z96642 Presence of left artificial hip joint: Secondary | ICD-10-CM

## 2022-03-13 MED ORDER — OXYCODONE HCL 5 MG PO TABS: 5.0000 mg | ORAL_TABLET | Freq: Four times a day (QID) | ORAL | 0 refills | Status: DC | PRN

## 2022-03-13 NOTE — Progress Notes (Signed)
HPI: Mrs. Costello returns today status post left total hip arthroplasty 02/27/2022.  She states she is overall doing well but is concerned due to the fact that she had a couple of incidences where she did the splits accidentally and concerned about the hip.  Ranks her pain to be 7.5 out of 10 pain at worst.  She is on aspirin for DVT prophylaxis she was on aspirin prior to surgery.  She is taking oxycodone for pain.  Otherwise she has no concerns.  Denies fevers or chills.  Review of systems: See HPI otherwise negative  Physical exam: General well-developed well-nourished female no acute distress.  Staples well-approximated surgical incision.  No signs of wound dehiscence or infection.  Staples harvested Steri-Strips applied.  Fluid range of motion of the left hip without significant pain.  Left calf supple nontender.  Dorsiflexion plantarflexion left ankle intact.  Radiographs: AP pelvis lateral view of the left hip shows well-seated left total hip arthroplasty without complications.  Hips well located.  No acute fractures.   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  She will perform scar tissue mobilization as discussed.  Refill of her oxycodone was given today.  She will go back on a regular 81 mg aspirin that she was on prior to surgery.  She will follow-up with [place] in 1 month.  Questions were encouraged and answered by Dr. Ninfa Linden and myself

## 2022-03-13 NOTE — Telephone Encounter (Signed)
Ortho bundle 14 day in office meeting completed. °

## 2022-03-14 DIAGNOSIS — Z7982 Long term (current) use of aspirin: Secondary | ICD-10-CM | POA: Diagnosis not present

## 2022-03-14 DIAGNOSIS — L280 Lichen simplex chronicus: Secondary | ICD-10-CM | POA: Diagnosis not present

## 2022-03-14 DIAGNOSIS — K219 Gastro-esophageal reflux disease without esophagitis: Secondary | ICD-10-CM | POA: Diagnosis not present

## 2022-03-14 DIAGNOSIS — I714 Abdominal aortic aneurysm, without rupture, unspecified: Secondary | ICD-10-CM | POA: Diagnosis not present

## 2022-03-14 DIAGNOSIS — E785 Hyperlipidemia, unspecified: Secondary | ICD-10-CM | POA: Diagnosis not present

## 2022-03-14 DIAGNOSIS — M4316 Spondylolisthesis, lumbar region: Secondary | ICD-10-CM | POA: Diagnosis not present

## 2022-03-14 DIAGNOSIS — Z96642 Presence of left artificial hip joint: Secondary | ICD-10-CM | POA: Diagnosis not present

## 2022-03-14 DIAGNOSIS — E039 Hypothyroidism, unspecified: Secondary | ICD-10-CM | POA: Diagnosis not present

## 2022-03-14 DIAGNOSIS — I2510 Atherosclerotic heart disease of native coronary artery without angina pectoris: Secondary | ICD-10-CM | POA: Diagnosis not present

## 2022-03-14 DIAGNOSIS — R69 Illness, unspecified: Secondary | ICD-10-CM | POA: Diagnosis not present

## 2022-03-14 DIAGNOSIS — I723 Aneurysm of iliac artery: Secondary | ICD-10-CM | POA: Diagnosis not present

## 2022-03-14 DIAGNOSIS — M48062 Spinal stenosis, lumbar region with neurogenic claudication: Secondary | ICD-10-CM | POA: Diagnosis not present

## 2022-03-14 DIAGNOSIS — I10 Essential (primary) hypertension: Secondary | ICD-10-CM | POA: Diagnosis not present

## 2022-03-14 DIAGNOSIS — K5090 Crohn's disease, unspecified, without complications: Secondary | ICD-10-CM | POA: Diagnosis not present

## 2022-03-14 DIAGNOSIS — Z86010 Personal history of colonic polyps: Secondary | ICD-10-CM | POA: Diagnosis not present

## 2022-03-14 DIAGNOSIS — Z6839 Body mass index (BMI) 39.0-39.9, adult: Secondary | ICD-10-CM | POA: Diagnosis not present

## 2022-03-14 DIAGNOSIS — M069 Rheumatoid arthritis, unspecified: Secondary | ICD-10-CM | POA: Diagnosis not present

## 2022-03-14 DIAGNOSIS — F17210 Nicotine dependence, cigarettes, uncomplicated: Secondary | ICD-10-CM | POA: Diagnosis not present

## 2022-03-14 DIAGNOSIS — E538 Deficiency of other specified B group vitamins: Secondary | ICD-10-CM | POA: Diagnosis not present

## 2022-03-14 DIAGNOSIS — Z471 Aftercare following joint replacement surgery: Secondary | ICD-10-CM | POA: Diagnosis not present

## 2022-03-14 DIAGNOSIS — E559 Vitamin D deficiency, unspecified: Secondary | ICD-10-CM | POA: Diagnosis not present

## 2022-03-14 DIAGNOSIS — I739 Peripheral vascular disease, unspecified: Secondary | ICD-10-CM | POA: Diagnosis not present

## 2022-03-14 DIAGNOSIS — M5136 Other intervertebral disc degeneration, lumbar region: Secondary | ICD-10-CM | POA: Diagnosis not present

## 2022-03-20 ENCOUNTER — Telehealth: Payer: Self-pay | Admitting: Orthopaedic Surgery

## 2022-03-20 ENCOUNTER — Other Ambulatory Visit: Payer: Self-pay | Admitting: Orthopaedic Surgery

## 2022-03-20 MED ORDER — OXYCODONE HCL 5 MG PO TABS: 5.0000 mg | ORAL_TABLET | Freq: Four times a day (QID) | ORAL | 0 refills | Status: DC | PRN

## 2022-03-20 NOTE — Telephone Encounter (Signed)
Patient would like refill of Oxycodone please advise

## 2022-03-28 ENCOUNTER — Other Ambulatory Visit: Payer: Self-pay | Admitting: Orthopaedic Surgery

## 2022-03-28 ENCOUNTER — Telehealth: Payer: Self-pay | Admitting: *Deleted

## 2022-03-28 MED ORDER — GABAPENTIN 100 MG PO CAPS: 100.0000 mg | ORAL_CAPSULE | Freq: Three times a day (TID) | ORAL | 0 refills | Status: DC | PRN

## 2022-03-28 MED ORDER — OXYCODONE HCL 5 MG PO TABS: 5.0000 mg | ORAL_TABLET | Freq: Four times a day (QID) | ORAL | 0 refills | Status: DC | PRN

## 2022-03-28 MED ORDER — METHOCARBAMOL 500 MG PO TABS: 500.0000 mg | ORAL_TABLET | Freq: Four times a day (QID) | ORAL | 1 refills | Status: DC | PRN

## 2022-03-28 NOTE — Telephone Encounter (Signed)
Call from patient and she described her incision as "sensitive". Also having some nerve type pain described as shooting at times. Worried about incision, but it is not red and doesn't sound like it is opened. No fever/chills or symptoms of infection per phone call. Back is hurting with change of gait most likely. Told her if incision continues to make her worry, or any other changes, then to call us and come in. Did ask for a refill of pain medication and methocarbamol. Thank you.

## 2022-04-04 ENCOUNTER — Ambulatory Visit: Payer: Medicare HMO | Admitting: Orthopedic Surgery

## 2022-04-05 ENCOUNTER — Telehealth: Payer: Self-pay | Admitting: *Deleted

## 2022-04-05 ENCOUNTER — Other Ambulatory Visit: Payer: Self-pay | Admitting: Orthopaedic Surgery

## 2022-04-05 MED ORDER — OXYCODONE HCL 5 MG PO TABS: 5.0000 mg | ORAL_TABLET | Freq: Four times a day (QID) | ORAL | 0 refills | Status: DC | PRN

## 2022-04-05 NOTE — Telephone Encounter (Signed)
Patient called requesting refill of pain medication. Thank you.

## 2022-04-16 ENCOUNTER — Ambulatory Visit (INDEPENDENT_AMBULATORY_CARE_PROVIDER_SITE_OTHER): Payer: Medicare HMO | Admitting: Orthopaedic Surgery

## 2022-04-16 ENCOUNTER — Encounter: Payer: Self-pay | Admitting: Orthopaedic Surgery

## 2022-04-16 ENCOUNTER — Ambulatory Visit: Payer: Medicare HMO | Admitting: Family Medicine

## 2022-04-16 DIAGNOSIS — Z96642 Presence of left artificial hip joint: Secondary | ICD-10-CM

## 2022-04-16 MED ORDER — HYDROCODONE-ACETAMINOPHEN 5-325 MG PO TABS: 1.0000 | ORAL_TABLET | Freq: Four times a day (QID) | ORAL | 0 refills | Status: DC | PRN

## 2022-04-16 NOTE — Progress Notes (Signed)
The patient comes in today at 6 weeks status post a left total hip arthroplasty.  She does report back pain.  She is someone who is significantly obese.  Some of that contributes to her back pain as well as how bad her hip was that she has been recovering from surgery.  She is still needing narcotic pain medicine and have counseled her about we need to really wean her from that medication.  She been on oxycodone.  I will send in some hydrocodone.  There is a small area of an opening at the top of her incision but it looks fine overall.  She just needs to use Neosporin on it once daily.  She is ambulate with a cane.  Overall the hip is moving well.  Will see her back in 6 weeks to see how she is doing overall.  I did send in some hydrocodone.  Will have a standing okay pelvis and lateral of her left hip at that next visit.

## 2022-05-10 ENCOUNTER — Encounter: Payer: Self-pay | Admitting: Radiology

## 2022-05-28 ENCOUNTER — Encounter: Payer: Self-pay | Admitting: Orthopaedic Surgery

## 2022-05-28 ENCOUNTER — Ambulatory Visit: Payer: Medicare HMO | Admitting: Orthopaedic Surgery

## 2022-05-28 ENCOUNTER — Other Ambulatory Visit (INDEPENDENT_AMBULATORY_CARE_PROVIDER_SITE_OTHER): Payer: Medicare HMO

## 2022-05-28 DIAGNOSIS — Z96642 Presence of left artificial hip joint: Secondary | ICD-10-CM

## 2022-05-28 NOTE — Progress Notes (Signed)
The patient is now 3 months status post a left total hip arthroplasty.  We wanted to see her today for new x-rays given her significant obesity.  She is walking much better overall.  She has had significant low back surgery in the past.  We have been following a small dehiscence of her wound at the superior aspect.  On exam the wound looks like to me that is healed over.  There is certainly scar tissue and she can continue scar medications or other treatments for this but overall looks good.  She is walking without any assistive device today.  Her left operative hip moves smoothly and fluidly.  X-rays of the pelvis and left hip show well-seated total hip arthroplasty with no complicating features.  It certainly would be beneficial for her to continue to lose weight but at this point she has no restrictions and should increase her activities as comfort allows.  From my standpoint, we will see her back in 8 months with just a standing AP pelvis.

## 2022-06-05 ENCOUNTER — Encounter: Payer: Self-pay | Admitting: Family Medicine

## 2022-06-05 ENCOUNTER — Ambulatory Visit (INDEPENDENT_AMBULATORY_CARE_PROVIDER_SITE_OTHER): Payer: Medicare HMO | Admitting: Family Medicine

## 2022-06-05 VITALS — BP 118/76 | HR 62 | Temp 97.6°F | Ht 66.0 in | Wt 235.0 lb

## 2022-06-05 DIAGNOSIS — I2510 Atherosclerotic heart disease of native coronary artery without angina pectoris: Secondary | ICD-10-CM | POA: Diagnosis not present

## 2022-06-05 DIAGNOSIS — I10 Essential (primary) hypertension: Secondary | ICD-10-CM

## 2022-06-05 DIAGNOSIS — E785 Hyperlipidemia, unspecified: Secondary | ICD-10-CM

## 2022-06-05 DIAGNOSIS — R7989 Other specified abnormal findings of blood chemistry: Secondary | ICD-10-CM | POA: Diagnosis not present

## 2022-06-05 DIAGNOSIS — Z720 Tobacco use: Secondary | ICD-10-CM

## 2022-06-05 LAB — CBC
HCT: 47.1 % — ABNORMAL HIGH (ref 36.0–46.0)
Hemoglobin: 15.6 g/dL — ABNORMAL HIGH (ref 12.0–15.0)
MCHC: 33 g/dL (ref 30.0–36.0)
MCV: 86.4 fl (ref 78.0–100.0)
Platelets: 223 10*3/uL (ref 150.0–400.0)
RBC: 5.45 Mil/uL — ABNORMAL HIGH (ref 3.87–5.11)
RDW: 16.2 % — ABNORMAL HIGH (ref 11.5–15.5)
WBC: 8.7 10*3/uL (ref 4.0–10.5)

## 2022-06-05 LAB — URINALYSIS, ROUTINE W REFLEX MICROSCOPIC
Bilirubin Urine: NEGATIVE
Hgb urine dipstick: NEGATIVE
Ketones, ur: NEGATIVE
Leukocytes,Ua: NEGATIVE
Nitrite: NEGATIVE
Specific Gravity, Urine: 1.015 (ref 1.000–1.030)
Total Protein, Urine: 30 — AB
Urine Glucose: NEGATIVE
Urobilinogen, UA: 1 (ref 0.0–1.0)
pH: 7 (ref 5.0–8.0)

## 2022-06-05 LAB — LIPID PANEL
Cholesterol: 141 mg/dL (ref 0–200)
HDL: 43.2 mg/dL (ref >39.00)
LDL Cholesterol: 72 mg/dL (ref 0–99)
NonHDL: 97.62
Total CHOL/HDL Ratio: 3
Triglycerides: 127 mg/dL (ref 0.0–149.0)
VLDL: 25.4 mg/dL (ref 0.0–40.0)

## 2022-06-05 LAB — COMPREHENSIVE METABOLIC PANEL
ALT: 10 U/L (ref 0–35)
AST: 15 U/L (ref 0–37)
Albumin: 4.2 g/dL (ref 3.5–5.2)
Alkaline Phosphatase: 80 U/L (ref 39–117)
BUN: 15 mg/dL (ref 6–23)
CO2: 30 mEq/L (ref 19–32)
Calcium: 10.4 mg/dL (ref 8.4–10.5)
Chloride: 102 mEq/L (ref 96–112)
Creatinine, Ser: 1.13 mg/dL (ref 0.40–1.20)
GFR: 51.91 mL/min — ABNORMAL LOW (ref >60.00)
Glucose, Bld: 93 mg/dL (ref 70–99)
Potassium: 4.2 mEq/L (ref 3.5–5.1)
Sodium: 140 mEq/L (ref 135–145)
Total Bilirubin: 0.6 mg/dL (ref 0.2–1.2)
Total Protein: 7.5 g/dL (ref 6.0–8.3)

## 2022-06-05 LAB — TSH: TSH: 1.86 u[IU]/mL (ref 0.35–5.50)

## 2022-06-05 MED ORDER — HYDROCHLOROTHIAZIDE 25 MG PO TABS: 25.0000 mg | ORAL_TABLET | Freq: Every day | ORAL | 0 refills | Status: DC

## 2022-06-05 MED ORDER — LISINOPRIL 40 MG PO TABS: 40.0000 mg | ORAL_TABLET | Freq: Every day | ORAL | 0 refills | Status: DC

## 2022-06-05 MED ORDER — ATENOLOL 100 MG PO TABS: 100.0000 mg | ORAL_TABLET | Freq: Every day | ORAL | 0 refills | Status: DC

## 2022-06-05 MED ORDER — POTASSIUM CHLORIDE CRYS ER 10 MEQ PO TBCR: 10.0000 meq | EXTENDED_RELEASE_TABLET | Freq: Every day | ORAL | 2 refills | Status: AC

## 2022-06-05 MED ORDER — ROSUVASTATIN CALCIUM 10 MG PO TABS: 10.0000 mg | ORAL_TABLET | Freq: Every day | ORAL | 0 refills | Status: DC

## 2022-06-05 NOTE — Progress Notes (Signed)
Established Patient Office Visit   Subjective:  Patient ID: Kathy Herrera, female    DOB: [birth year]  Age: 62 y.o. MRN: [id number]  Chief Complaint  Patient presents with   Medical Management of Chronic Issues    Routine follow up refill on medication. Patient fasting.     HPI Encounter Diagnoses  Name Primary?   Tobacco abuse Yes   Essential hypertension    ASCVD (arteriosclerotic cardiovascular disease)    Hyperlipidemia, unspecified hyperlipidemia type    Abnormal TSH    Here today for follow-up of the above.  Status post left hip replacement back in December.  He is doing well.  She is able to exercise some by walking.  She started smoking again.  She is accompanied by a friend today.  She reports cold intolerance.   Review of Systems  Constitutional: Negative.   HENT: Negative.    Eyes:  Negative for blurred vision, discharge and redness.  Respiratory: Negative.    Cardiovascular: Negative.   Gastrointestinal:  Negative for abdominal pain.  Genitourinary: Negative.   Musculoskeletal: Negative.  Negative for myalgias.  Skin:  Negative for rash.  Neurological:  Negative for tingling, loss of consciousness and weakness.  Endo/Heme/Allergies:  Negative for polydipsia.     Current Outpatient Medications:    acetaminophen (TYLENOL) 500 MG tablet, Take 1,000 mg by mouth every 6 (six) hours as needed for moderate pain., Disp: , Rfl:    aspirin 81 MG chewable tablet, Chew 1 tablet (81 mg total) by mouth 2 (two) times daily., Disp: 30 tablet, Rfl: 0   cholecalciferol (VITAMIN D) 25 MCG (1000 UNIT) tablet, Take 1,000 Units by mouth daily., Disp: , Rfl:    gabapentin (NEURONTIN) 100 MG capsule, Take 1 capsule (100 mg total) by mouth 3 (three) times daily as needed. (Patient not taking: Reported on 06/05/2022), Disp: 60 capsule, Rfl: 0   MELATONIN PO, Take 1 tablet by mouth at bedtime as needed (sleep)., Disp: , Rfl:    Multiple Vitamins-Minerals (MULTIVITAMIN WITH MINERALS)  tablet, Take 1 tablet by mouth daily., Disp: , Rfl:    traZODone (DESYREL) 50 MG tablet, Take 50 mg by mouth at bedtime as needed for sleep., Disp: , Rfl:    vitamin B-12 (CYANOCOBALAMIN) 100 MCG tablet, Take 100 mcg by mouth daily., Disp: , Rfl:    albuterol (VENTOLIN HFA) 108 (90 Base) MCG/ACT inhaler, Inhale 2 puffs into the lungs every 6 (six) hours as needed for wheezing or shortness of breath. (Patient not taking: Reported on 06/05/2022), Disp: 8 g, Rfl: 0   atenolol (TENORMIN) 100 MG tablet, Take 1 tablet (100 mg total) by mouth daily., Disp: 90 tablet, Rfl: 0   DULoxetine (CYMBALTA) 20 MG capsule, Take 1 capsule (20 mg total) by mouth daily. (Patient not taking: Reported on 06/05/2022), Disp: 30 capsule, Rfl: 3   hydrochlorothiazide (HYDRODIURIL) 25 MG tablet, Take 1 tablet (25 mg total) by mouth daily., Disp: 90 tablet, Rfl: 0   lisinopril (ZESTRIL) 40 MG tablet, Take 1 tablet (40 mg total) by mouth daily., Disp: 90 tablet, Rfl: 0   potassium chloride (KLOR-CON M) 10 MEQ tablet, Take 1 tablet (10 mEq total) by mouth daily., Disp: 90 tablet, Rfl: 2   rosuvastatin (CRESTOR) 10 MG tablet, Take 1 tablet (10 mg total) by mouth daily., Disp: 90 tablet, Rfl: 0   Objective:     BP 118/76 (BP Location: Right Arm, Patient Position: Sitting, Cuff Size: Large)   Pulse 62   Temp 97.6  F (36.4 C) (Temporal)   Ht 5\' 6"  (1.676 m)   Wt 235 lb (106.6 kg)   SpO2 95%   BMI 37.93 kg/m  Wt Readings from Last 3 Encounters:  06/05/22 235 lb (106.6 kg)  02/27/22 242 lb (109.8 kg)  02/20/22 245 lb 1.6 oz (111.2 kg)      Physical Exam Constitutional:      General: She is not in acute distress.    Appearance: Normal appearance. She is not ill-appearing, toxic-appearing or diaphoretic.  HENT:     Head: Normocephalic and atraumatic.     Right Ear: External ear normal.     Left Ear: External ear normal.     Mouth/Throat:     Mouth: Mucous membranes are moist.     Pharynx: Oropharynx is clear. No  oropharyngeal exudate or posterior oropharyngeal erythema.  Eyes:     General: No scleral icterus.       Right eye: No discharge.        Left eye: No discharge.     Extraocular Movements: Extraocular movements intact.     Conjunctiva/sclera: Conjunctivae normal.     Pupils: Pupils are equal, round, and reactive to light.  Cardiovascular:     Rate and Rhythm: Normal rate and regular rhythm.  Pulmonary:     Effort: Pulmonary effort is normal. No respiratory distress.     Breath sounds: Normal breath sounds. Decreased air movement present.  Skin:    General: Skin is warm and dry.  Neurological:     Mental Status: She is alert and oriented to person, place, and time.  Psychiatric:        Mood and Affect: Mood normal.        Behavior: Behavior normal.      No results found for any visits on 06/05/22.    The 10-year ASCVD risk score (Arnett DK, et al., 2019) is: 10.7%    Assessment & Plan:   Tobacco abuse  Essential hypertension -     hydroCHLOROthiazide; Take 1 tablet (25 mg total) by mouth daily.  Dispense: 90 tablet; Refill: 0 -     Lisinopril; Take 1 tablet (40 mg total) by mouth daily.  Dispense: 90 tablet; Refill: 0 -     Potassium Chloride Crys ER; Take 1 tablet (10 mEq total) by mouth daily.  Dispense: 90 tablet; Refill: 2 -     Atenolol; Take 1 tablet (100 mg total) by mouth daily.  Dispense: 90 tablet; Refill: 0 -     CBC -     Comprehensive metabolic panel -     Urinalysis, Routine w reflex microscopic  ASCVD (arteriosclerotic cardiovascular disease) -     Rosuvastatin Calcium; Take 1 tablet (10 mg total) by mouth daily.  Dispense: 90 tablet; Refill: 0 -     Atenolol; Take 1 tablet (100 mg total) by mouth daily.  Dispense: 90 tablet; Refill: 0 -     Comprehensive metabolic panel  Hyperlipidemia, unspecified hyperlipidemia type -     Rosuvastatin Calcium; Take 1 tablet (10 mg total) by mouth daily.  Dispense: 90 tablet; Refill: 0 -     Comprehensive metabolic  panel -     Lipid panel  Abnormal TSH -     TSH    Return in about 3 months (around 09/04/2022).  Strongly encouraged patient to stop smoking.  She is planning on starting the patches when she finishes her current supply of cigarettes.  She was unaware of her diagnosis of  COPD.  She will schedule follow-up with pulmonology for screening CT of chest.  Encouraged her to continue with weight loss efforts.  Encouraged her to start exercise by walking.  Libby Maw, MD

## 2022-08-19 ENCOUNTER — Other Ambulatory Visit: Payer: Self-pay | Admitting: Family Medicine

## 2022-08-19 DIAGNOSIS — I10 Essential (primary) hypertension: Secondary | ICD-10-CM

## 2022-08-26 ENCOUNTER — Other Ambulatory Visit: Payer: Self-pay | Admitting: Family Medicine

## 2022-08-26 DIAGNOSIS — I10 Essential (primary) hypertension: Secondary | ICD-10-CM

## 2022-09-01 ENCOUNTER — Other Ambulatory Visit: Payer: Self-pay | Admitting: Family Medicine

## 2022-09-01 DIAGNOSIS — I2510 Atherosclerotic heart disease of native coronary artery without angina pectoris: Secondary | ICD-10-CM

## 2022-09-01 DIAGNOSIS — E785 Hyperlipidemia, unspecified: Secondary | ICD-10-CM

## 2022-09-12 ENCOUNTER — Other Ambulatory Visit: Payer: Self-pay | Admitting: *Deleted

## 2022-09-12 DIAGNOSIS — Z9889 Other specified postprocedural states: Secondary | ICD-10-CM

## 2022-10-01 NOTE — Progress Notes (Unsigned)
[facility] [place] PROGRESS NOTE  ASSESSMENT / PLAN: Kathy Herrera is a 62 y.o. female status post EVAR for AAA, RCIAA, RIIAA 06/24/20. Duplex shows aortic sac regression to 41mm, but limited visualization of iliacs due to body habitus. Ectasia of LIIA noted, and will need to be monitored with q5y CTA (2027). Follow up in 1 year with EVAR duplex. If this is limited, will check CT scan.   SUBJECTIVE: No complaints. No abdominal pain. At baseline level of limited ambulation.   OBJECTIVE: There were no vitals taken for this visit.  No distress Feet warm and well perfused     Latest Ref Rng & Units 06/05/2022   10:49 AM 02/28/2022    2:56 AM 02/20/2022    9:26 AM  CBC  WBC 4.0 - 10.5 K/uL 8.7  16.4  8.1   Hemoglobin 12.0 - 15.0 g/dL 16.1  09.6  04.5   Hematocrit 36.0 - 46.0 % 47.1  43.1  46.6   Platelets 150.0 - 400.0 K/uL 223.0  196  216         Latest Ref Rng & Units 06/05/2022   10:49 AM 02/28/2022    2:56 AM 02/20/2022    9:26 AM  CMP  Glucose 70 - 99 mg/dL 93  409  811   BUN 6 - 23 mg/dL 15  20  10    Creatinine 0.40 - 1.20 mg/dL 9.14  7.82  9.56   Sodium 135 - 145 mEq/L 140  136  139   Potassium 3.5 - 5.1 mEq/L 4.2  4.2  3.8   Chloride 96 - 112 mEq/L 102  101  108   CO2 19 - 32 mEq/L 30  26  26    Calcium 8.4 - 10.5 mg/dL 21.3  9.2  08.6   Total Protein 6.0 - 8.3 g/dL 7.5   7.3   Total Bilirubin 0.2 - 1.2 mg/dL 0.6   0.6   Alkaline Phos 39 - 117 U/L 80   65   AST 0 - 37 U/L 15   17   ALT 0 - 35 U/L 10   12    AAA duplex shows sac regression to 41mm. Iliacs poorly visaulized.   Rande Brunt. Lenell Antu, MD [facility] Phone Number: [phone] 10/01/2022 4:35 PM

## 2022-10-02 ENCOUNTER — Encounter: Payer: Self-pay | Admitting: Vascular Surgery

## 2022-10-02 ENCOUNTER — Ambulatory Visit (HOSPITAL_COMMUNITY)
Admission: RE | Admit: 2022-10-02 | Discharge: 2022-10-02 | Disposition: A | Discharge status: Home / Self Care | Payer: Medicare HMO | Source: Ambulatory Visit | Attending: Vascular Surgery | Admitting: Vascular Surgery

## 2022-10-02 ENCOUNTER — Ambulatory Visit: Payer: Medicare HMO | Admitting: Vascular Surgery

## 2022-10-02 VITALS — BP 131/80 | HR 56 | Temp 98.5°F | Resp 20 | Ht 66.0 in | Wt 241.0 lb

## 2022-10-02 DIAGNOSIS — K5909 Other constipation: Secondary | ICD-10-CM

## 2022-10-02 DIAGNOSIS — Z9889 Other specified postprocedural states: Secondary | ICD-10-CM | POA: Diagnosis not present

## 2022-10-02 DIAGNOSIS — Z8679 Personal history of other diseases of the circulatory system: Secondary | ICD-10-CM

## 2022-10-02 MED ORDER — CLOPIDOGREL BISULFATE 75 MG PO TABS: 75.0000 mg | ORAL_TABLET | Freq: Every day | ORAL | 6 refills | Status: DC

## 2022-10-03 ENCOUNTER — Other Ambulatory Visit: Payer: Self-pay

## 2022-10-03 DIAGNOSIS — Z8679 Personal history of other diseases of the circulatory system: Secondary | ICD-10-CM

## 2022-10-03 DIAGNOSIS — I714 Abdominal aortic aneurysm, without rupture, unspecified: Secondary | ICD-10-CM

## 2022-10-05 ENCOUNTER — Other Ambulatory Visit: Payer: Self-pay | Admitting: Family Medicine

## 2022-10-05 DIAGNOSIS — I10 Essential (primary) hypertension: Secondary | ICD-10-CM

## 2022-10-05 DIAGNOSIS — I2510 Atherosclerotic heart disease of native coronary artery without angina pectoris: Secondary | ICD-10-CM

## 2022-10-23 ENCOUNTER — Ambulatory Visit
Admission: RE | Admit: 2022-10-23 | Discharge: 2022-10-23 | Disposition: A | Discharge status: Home / Self Care | Payer: Medicare HMO | Source: Ambulatory Visit | Attending: Vascular Surgery | Admitting: Vascular Surgery

## 2022-10-23 DIAGNOSIS — Z9889 Other specified postprocedural states: Secondary | ICD-10-CM

## 2022-10-23 DIAGNOSIS — I7143 Infrarenal abdominal aortic aneurysm, without rupture: Secondary | ICD-10-CM | POA: Diagnosis not present

## 2022-10-23 DIAGNOSIS — I714 Abdominal aortic aneurysm, without rupture, unspecified: Secondary | ICD-10-CM

## 2022-10-23 MED ORDER — IOPAMIDOL (ISOVUE-370) INJECTION 76%
200.0000 mL | Freq: Once | INTRAVENOUS | Status: AC | PRN
  Administered 2022-10-23: 100 mL via INTRAVENOUS

## 2022-11-06 ENCOUNTER — Ambulatory Visit (INDEPENDENT_AMBULATORY_CARE_PROVIDER_SITE_OTHER): Payer: Self-pay | Admitting: Vascular Surgery

## 2022-11-06 DIAGNOSIS — Z9889 Other specified postprocedural states: Secondary | ICD-10-CM

## 2022-11-06 DIAGNOSIS — Z8679 Personal history of other diseases of the circulatory system: Secondary | ICD-10-CM

## 2022-11-06 NOTE — Progress Notes (Signed)
I reviewed the patient's CT angiogram in detail with her via telephone.  No evidence of endoleak is noted.  Good technical result from EVAR noted.  I will see her again in 1 year with duplex of the abdominal aorta.  Rande Brunt. Lenell Antu, MD [facility] Phone Number: [phone] 11/06/2022 8:10 PM

## 2022-11-20 ENCOUNTER — Other Ambulatory Visit: Payer: Self-pay

## 2022-11-20 DIAGNOSIS — I714 Abdominal aortic aneurysm, without rupture, unspecified: Secondary | ICD-10-CM

## 2022-11-30 ENCOUNTER — Other Ambulatory Visit: Payer: Self-pay | Admitting: Family Medicine

## 2022-11-30 DIAGNOSIS — I2510 Atherosclerotic heart disease of native coronary artery without angina pectoris: Secondary | ICD-10-CM

## 2022-11-30 DIAGNOSIS — E785 Hyperlipidemia, unspecified: Secondary | ICD-10-CM

## 2022-11-30 DIAGNOSIS — I10 Essential (primary) hypertension: Secondary | ICD-10-CM

## 2023-01-08 DIAGNOSIS — K5900 Constipation, unspecified: Secondary | ICD-10-CM | POA: Diagnosis not present

## 2023-01-23 DIAGNOSIS — K648 Other hemorrhoids: Secondary | ICD-10-CM | POA: Diagnosis not present

## 2023-01-23 DIAGNOSIS — K5900 Constipation, unspecified: Secondary | ICD-10-CM | POA: Diagnosis not present

## 2023-01-23 DIAGNOSIS — K5730 Diverticulosis of large intestine without perforation or abscess without bleeding: Secondary | ICD-10-CM | POA: Diagnosis not present

## 2023-01-28 ENCOUNTER — Ambulatory Visit: Payer: Medicare HMO | Admitting: Orthopaedic Surgery

## 2023-02-18 ENCOUNTER — Ambulatory Visit (INDEPENDENT_AMBULATORY_CARE_PROVIDER_SITE_OTHER): Payer: Medicare HMO

## 2023-02-18 DIAGNOSIS — F17200 Nicotine dependence, unspecified, uncomplicated: Secondary | ICD-10-CM

## 2023-02-18 DIAGNOSIS — Z0000 Encounter for general adult medical examination without abnormal findings: Secondary | ICD-10-CM | POA: Diagnosis not present

## 2023-02-18 DIAGNOSIS — Z122 Encounter for screening for malignant neoplasm of respiratory organs: Secondary | ICD-10-CM
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
## 2023-02-18 NOTE — Patient Instructions (Signed)
[name] , Thank you for taking time to come for your Medicare Wellness Visit. I appreciate your ongoing commitment to your health goals. Please review the following plan we discussed and let me know if I can assist you in the future.   Referrals/Orders/Follow-Ups/Clinician Recommendations: none  This is a list of the screening recommended for you and due dates:  Health Maintenance  Topic Date Due   Screening for Lung Cancer  10/28/2021   Mammogram  03/17/2022   Flu Shot  10/04/2022   Pap with HPV screening  10/29/2022   Medicare Annual Wellness Visit  02/18/2024   Colon Cancer Screening  02/16/2029   Hepatitis C Screening  Completed   HIV Screening  Completed   HPV Vaccine  Aged Out   DTaP/Tdap/Td vaccine  Discontinued   COVID-19 Vaccine  Discontinued   Zoster (Shingles) Vaccine  Discontinued    Advanced directives: (ACP Link)Information on Advanced Care Planning can be found at [facility] Advance Health Care Directives Advance Health Care Directives ([email or web address]   Next Medicare Annual Wellness Visit scheduled for next year: Yes  insert Preventive Care Attachment Reference

## 2023-02-20 ENCOUNTER — Encounter: Payer: Self-pay | Admitting: Orthopaedic Surgery

## 2023-02-20 ENCOUNTER — Other Ambulatory Visit (INDEPENDENT_AMBULATORY_CARE_PROVIDER_SITE_OTHER): Payer: Medicare HMO

## 2023-02-20 ENCOUNTER — Ambulatory Visit: Payer: Medicare HMO | Admitting: Orthopaedic Surgery

## 2023-02-20 DIAGNOSIS — M7061 Trochanteric bursitis, right hip: Secondary | ICD-10-CM

## 2023-02-20 DIAGNOSIS — Z96642 Presence of left artificial hip joint: Secondary | ICD-10-CM | POA: Diagnosis not present

## 2023-02-20 MED ORDER — METHYLPREDNISOLONE ACETATE 40 MG/ML IJ SUSP
40.0000 mg | INTRAMUSCULAR | Status: AC | PRN
  Administered 2023-02-20: 40 mg via INTRA_ARTICULAR

## 2023-02-20 MED ORDER — LIDOCAINE HCL 1 % IJ SOLN
3.0000 mL | INTRAMUSCULAR | Status: AC | PRN
  Administered 2023-02-20: 3 mL

## 2023-02-20 NOTE — Progress Notes (Signed)
The patient is 1 year out from a left total hip arthroplasty.  She is doing well with that hip.  She does ambulate with a rolling walker.  She has chronic low back issues from previous surgeries in her back.  She also has had stents and an abdominal aneurysm that was addressed.  She reports some right hip pain but is not in the groin.  On exam her right hip moves smoothly and fluidly.  Her left operative hip also moves smoothly and fluidly.  There is some pain over the trochanteric area of the right hip.  She does have some chronic low back pain on the right side.  Standing AP pelvis shows a well-seated left total hip arthroplasty.  Her right hip joint space still is well-maintained.  I did see a CT angiogram of her abdomen pelvis that was performed in August and I can look at the bone windows and see her right hip space is well-maintained.  I did place a steroid injection of her trochanteric area of her right hip for bursitis.  Hopefully this will help her.  All questions concerns were answered and addressed.  Follow-up at this point can be as needed.  She is welcome return anytime if she has worsening issues.    Procedure Note  Patient: Kathy Herrera [facility]             Date of Birth: 08-30-1960           MRN: [id number]             Visit Date: 02/20/2023  Procedures: Visit Diagnoses:  1. History of left hip replacement   2. Trochanteric bursitis, right hip     Large Joint Inj: R greater trochanter on 02/20/2023 3:13 PM Indications: pain and diagnostic evaluation Details: 22 G 1.5 in needle, lateral approach  Arthrogram: No  Medications: 3 mL lidocaine 1 %; 40 mg methylPREDNISolone acetate 40 MG/ML Outcome: tolerated well, no immediate complications Procedure, treatment alternatives, risks and benefits explained, specific risks discussed. Consent was given by the patient. Immediately prior to procedure a time out was called to verify the correct patient, procedure, equipment, support staff  and site/side marked as required. Patient was prepped and draped in the usual sterile fashion.

## 2023-02-22 ENCOUNTER — Other Ambulatory Visit: Payer: Self-pay

## 2023-02-22 DIAGNOSIS — Z87891 Personal history of nicotine dependence: Secondary | ICD-10-CM

## 2023-02-22 DIAGNOSIS — R911 Solitary pulmonary nodule: Secondary | ICD-10-CM

## 2023-02-22 DIAGNOSIS — F17210 Nicotine dependence, cigarettes, uncomplicated: Secondary | ICD-10-CM

## 2023-02-22 DIAGNOSIS — Z122 Encounter for screening for malignant neoplasm of respiratory organs: Secondary | ICD-10-CM

## 2023-02-22 DIAGNOSIS — Z9189 Other specified personal risk factors, not elsewhere classified: Secondary | ICD-10-CM

## 2023-03-13 DIAGNOSIS — I2510 Atherosclerotic heart disease of native coronary artery without angina pectoris: Secondary | ICD-10-CM | POA: Diagnosis not present

## 2023-03-13 DIAGNOSIS — R32 Unspecified urinary incontinence: Secondary | ICD-10-CM | POA: Diagnosis not present

## 2023-03-13 DIAGNOSIS — E785 Hyperlipidemia, unspecified: Secondary | ICD-10-CM | POA: Diagnosis not present

## 2023-03-13 DIAGNOSIS — Z8249 Family history of ischemic heart disease and other diseases of the circulatory system: Secondary | ICD-10-CM | POA: Diagnosis not present

## 2023-03-13 DIAGNOSIS — Z833 Family history of diabetes mellitus: Secondary | ICD-10-CM | POA: Diagnosis not present

## 2023-03-13 DIAGNOSIS — M069 Rheumatoid arthritis, unspecified: Secondary | ICD-10-CM | POA: Diagnosis not present

## 2023-03-13 DIAGNOSIS — I10 Essential (primary) hypertension: Secondary | ICD-10-CM | POA: Diagnosis not present

## 2023-03-13 DIAGNOSIS — K219 Gastro-esophageal reflux disease without esophagitis: Secondary | ICD-10-CM | POA: Diagnosis not present

## 2023-03-13 DIAGNOSIS — M1990 Unspecified osteoarthritis, unspecified site: Secondary | ICD-10-CM | POA: Diagnosis not present

## 2023-03-13 DIAGNOSIS — K5090 Crohn's disease, unspecified, without complications: Secondary | ICD-10-CM | POA: Diagnosis not present

## 2023-03-13 DIAGNOSIS — I719 Aortic aneurysm of unspecified site, without rupture: Secondary | ICD-10-CM | POA: Diagnosis not present

## 2023-03-19 ENCOUNTER — Other Ambulatory Visit: Payer: Medicare HMO

## 2023-07-08 ENCOUNTER — Other Ambulatory Visit: Payer: Self-pay | Admitting: Family Medicine

## 2023-07-08 DIAGNOSIS — E785 Hyperlipidemia, unspecified: Secondary | ICD-10-CM

## 2023-07-08 DIAGNOSIS — I10 Essential (primary) hypertension: Secondary | ICD-10-CM

## 2023-07-08 DIAGNOSIS — I2510 Atherosclerotic heart disease of native coronary artery without angina pectoris: Secondary | ICD-10-CM

## 2023-07-10 ENCOUNTER — Ambulatory Visit
Admission: RE | Admit: 2023-07-10 | Discharge: 2023-07-10 | Disposition: A | Discharge status: Home / Self Care | Payer: Medicare HMO | Source: Ambulatory Visit | Attending: Acute Care | Admitting: Acute Care

## 2023-07-10 DIAGNOSIS — F17210 Nicotine dependence, cigarettes, uncomplicated: Secondary | ICD-10-CM | POA: Diagnosis not present

## 2023-07-10 DIAGNOSIS — Z87891 Personal history of nicotine dependence: Secondary | ICD-10-CM

## 2023-07-10 DIAGNOSIS — R911 Solitary pulmonary nodule: Secondary | ICD-10-CM | POA: Diagnosis not present

## 2023-07-10 DIAGNOSIS — Z122 Encounter for screening for malignant neoplasm of respiratory organs: Secondary | ICD-10-CM

## 2023-07-17 ENCOUNTER — Other Ambulatory Visit: Payer: Self-pay | Admitting: Family Medicine

## 2023-07-17 NOTE — Telephone Encounter (Unsigned)
 Copied from CRM [id number]. Topic: Clinical - Medication Refill >> Jul 17, 2023 10:01 AM Shereese L wrote: Medication: potassium chloride  (KLOR-CON  M) 10 MEQ tablet rosuvastatin  (CRESTOR ) 10 MG tablet hydrochlorothiazide  (HYDRODIURIL ) 25 MG tablet atenolol  (TENORMIN ) 100 MG tablet lisinopril  (ZESTRIL ) 40 MG tablet albuterol  (VENTOLIN  HFA) 108 (90 Base) MCG/ACT inhaler  Has the patient contacted their pharmacy? No (Agent: If no, request that the patient contact the pharmacy for the refill. If patient does not wish to contact the pharmacy document the reason why and proceed with request.) (Agent: If yes, when and what did the pharmacy advise?)  This is the patient's preferred pharmacy:  [facility] [address], [place] - [address] [facility] [place] [address] Phone: [phone] Fax: [phone]  Is this the correct pharmacy for this prescription? Yes If no, delete pharmacy and type the correct one.   Has the prescription been filled recently? No  Is the patient out of the medication? Yes  Has the patient been seen for an appointment in the last year OR does the patient have an upcoming appointment? Yes  Can we respond through MyChart? Yes  Agent: Please be advised that Rx refills may take up to 3 business days. We ask that you follow-up with your pharmacy.

## 2023-07-23 ENCOUNTER — Ambulatory Visit: Payer: Self-pay | Admitting: Family Medicine

## 2023-07-23 ENCOUNTER — Encounter: Payer: Self-pay | Admitting: Family Medicine

## 2023-07-23 ENCOUNTER — Ambulatory Visit (INDEPENDENT_AMBULATORY_CARE_PROVIDER_SITE_OTHER): Admitting: Family Medicine

## 2023-07-23 VITALS — BP 160/98 | HR 84 | Temp 96.8°F | Ht 66.0 in | Wt 239.8 lb

## 2023-07-23 DIAGNOSIS — Z720 Tobacco use: Secondary | ICD-10-CM | POA: Diagnosis not present

## 2023-07-23 DIAGNOSIS — J45909 Unspecified asthma, uncomplicated: Secondary | ICD-10-CM | POA: Insufficient documentation

## 2023-07-23 DIAGNOSIS — L0292 Furuncle, unspecified: Secondary | ICD-10-CM | POA: Insufficient documentation

## 2023-07-23 DIAGNOSIS — J4520 Mild intermittent asthma, uncomplicated: Secondary | ICD-10-CM

## 2023-07-23 DIAGNOSIS — E785 Hyperlipidemia, unspecified: Secondary | ICD-10-CM

## 2023-07-23 DIAGNOSIS — E538 Deficiency of other specified B group vitamins: Secondary | ICD-10-CM

## 2023-07-23 DIAGNOSIS — I2510 Atherosclerotic heart disease of native coronary artery without angina pectoris: Secondary | ICD-10-CM

## 2023-07-23 DIAGNOSIS — M898X6 Other specified disorders of bone, lower leg: Secondary | ICD-10-CM

## 2023-07-23 DIAGNOSIS — M79671 Pain in right foot: Secondary | ICD-10-CM

## 2023-07-23 DIAGNOSIS — J40 Bronchitis, not specified as acute or chronic: Secondary | ICD-10-CM | POA: Insufficient documentation

## 2023-07-23 DIAGNOSIS — Z131 Encounter for screening for diabetes mellitus: Secondary | ICD-10-CM

## 2023-07-23 DIAGNOSIS — I10 Essential (primary) hypertension: Secondary | ICD-10-CM | POA: Diagnosis not present

## 2023-07-23 LAB — URINALYSIS, ROUTINE W REFLEX MICROSCOPIC
Bilirubin Urine: NEGATIVE
Ketones, ur: NEGATIVE
Leukocytes,Ua: NEGATIVE
Nitrite: NEGATIVE
Specific Gravity, Urine: 1.02 (ref 1.000–1.030)
Total Protein, Urine: 100 — AB
Urine Glucose: NEGATIVE
Urobilinogen, UA: 1 (ref 0.0–1.0)
pH: 6.5 (ref 5.0–8.0)

## 2023-07-23 LAB — COMPREHENSIVE METABOLIC PANEL WITH GFR
ALT: 11 U/L (ref 0–35)
AST: 16 U/L (ref 0–37)
Albumin: 4.2 g/dL (ref 3.5–5.2)
Alkaline Phosphatase: 90 U/L (ref 39–117)
BUN: 14 mg/dL (ref 6–23)
CO2: 28 meq/L (ref 19–32)
Calcium: 10.4 mg/dL (ref 8.4–10.5)
Chloride: 101 meq/L (ref 96–112)
Creatinine, Ser: 1.05 mg/dL (ref 0.40–1.20)
GFR: 56.24 mL/min — ABNORMAL LOW (ref >60.00)
Glucose, Bld: 86 mg/dL (ref 70–99)
Potassium: 4.2 meq/L (ref 3.5–5.1)
Sodium: 140 meq/L (ref 135–145)
Total Bilirubin: 0.5 mg/dL (ref 0.2–1.2)
Total Protein: 8.4 g/dL — ABNORMAL HIGH (ref 6.0–8.3)

## 2023-07-23 LAB — CBC
HCT: 52.4 % — ABNORMAL HIGH (ref 36.0–46.0)
Hemoglobin: 16.8 g/dL — ABNORMAL HIGH (ref 12.0–15.0)
MCHC: 32.1 g/dL (ref 30.0–36.0)
MCV: 90.4 fl (ref 78.0–100.0)
Platelets: 201 10*3/uL (ref 150.0–400.0)
RBC: 5.8 Mil/uL — ABNORMAL HIGH (ref 3.87–5.11)
RDW: 15.2 % (ref 11.5–15.5)
WBC: 7 10*3/uL (ref 4.0–10.5)

## 2023-07-23 LAB — HEMOGLOBIN A1C: Hgb A1c MFr Bld: 5.8 % (ref 4.6–6.5)

## 2023-07-23 LAB — LIPID PANEL
Cholesterol: 208 mg/dL — ABNORMAL HIGH (ref 0–200)
HDL: 46.8 mg/dL (ref >39.00)
LDL Cholesterol: 135 mg/dL — ABNORMAL HIGH (ref 0–99)
NonHDL: 161.27
Total CHOL/HDL Ratio: 4
Triglycerides: 133 mg/dL (ref 0.0–149.0)
VLDL: 26.6 mg/dL (ref 0.0–40.0)

## 2023-07-23 LAB — VITAMIN B12: Vitamin B-12: 942 pg/mL — ABNORMAL HIGH (ref 211–911)

## 2023-07-23 MED ORDER — HYDROCHLOROTHIAZIDE 25 MG PO TABS: 25.0000 mg | ORAL_TABLET | Freq: Every day | ORAL | 0 refills | Status: DC

## 2023-07-23 MED ORDER — ATENOLOL 100 MG PO TABS: 100.0000 mg | ORAL_TABLET | Freq: Every day | ORAL | 0 refills | Status: DC

## 2023-07-23 MED ORDER — ALBUTEROL SULFATE HFA 108 (90 BASE) MCG/ACT IN AERS: 2.0000 | INHALATION_SPRAY | Freq: Four times a day (QID) | RESPIRATORY_TRACT | 0 refills | Status: AC | PRN

## 2023-07-23 MED ORDER — LISINOPRIL 40 MG PO TABS: 40.0000 mg | ORAL_TABLET | Freq: Every day | ORAL | 0 refills | Status: DC

## 2023-07-23 MED ORDER — VARENICLINE TARTRATE 0.5 MG PO TABS: ORAL_TABLET | ORAL | 0 refills | Status: AC

## 2023-07-23 MED ORDER — ROSUVASTATIN CALCIUM 10 MG PO TABS: 10.0000 mg | ORAL_TABLET | Freq: Every day | ORAL | 0 refills | Status: DC

## 2023-07-23 NOTE — Progress Notes (Signed)
 Established Patient Office Visit   Subjective:  Patient ID: Kathy Herrera, female    DOB: 1961/01/15  Age: 63 y.o. MRN: [id number]  Chief Complaint  Patient presents with   Headache    Headaches x 2 month. Pt complains of pain in the occipital area.    Foot Pain    Pt complains of inflammation on the later side of right heel x 2 weeks ago.    Medical Management of Chronic Issues    Follow up. Pt is fasting.     Headache  Pertinent negatives include no abdominal pain, blurred vision, eye redness, tingling or weakness.  Foot Pain Associated symptoms include headaches. Pertinent negatives include no abdominal pain, myalgias, rash or weakness.   Encounter Diagnoses  Name Primary?   Tobacco abuse Yes   ASCVD (arteriosclerotic cardiovascular disease)    Hyperlipidemia, unspecified hyperlipidemia type    Essential hypertension    B12 deficiency    Screening for diabetes mellitus    Mild intermittent reactive airway disease without complication    Recurrent boils    Pain of right heel    Pain in left tibia    For follow-up of above.  She is here with her son.  Has been out of her medications for several months.  Was seen in April a year ago and asked to return in 3 months.  She has been having headaches in the back of her head along with lightheadedness.  She continues to smoke.  She has tried Chantix  in the past and had no issues taking it.  She is having pain in her right lateral heel and left medial distal tibia.  No injuries.  She has recurrent boils in her inguinal area.  She has tried antibiotics in the past and they resolve until she completes her course of antibiotics.   Review of Systems  Constitutional: Negative.   HENT: Negative.    Eyes:  Negative for blurred vision, discharge and redness.  Respiratory: Negative.    Cardiovascular: Negative.   Gastrointestinal:  Negative for abdominal pain.  Genitourinary: Negative.   Musculoskeletal: Negative.  Negative for  myalgias.  Skin:  Negative for rash.  Neurological:  Positive for headaches. Negative for tingling, loss of consciousness and weakness.  Endo/Heme/Allergies:  Negative for polydipsia.     Current Outpatient Medications:    acetaminophen  (TYLENOL ) 500 MG tablet, Take 1,000 mg by mouth every 6 (six) hours as needed for moderate pain., Disp: , Rfl:    aspirin  81 MG chewable tablet, Chew 1 tablet (81 mg total) by mouth 2 (two) times daily., Disp: 30 tablet, Rfl: 0   cholecalciferol  (VITAMIN D ) 25 MCG (1000 UNIT) tablet, Take 1,000 Units by mouth daily., Disp: , Rfl:    MELATONIN PO, Take 1 tablet by mouth at bedtime as needed (sleep)., Disp: , Rfl:    Multiple Vitamins-Minerals (MULTIVITAMIN WITH MINERALS) tablet, Take 1 tablet by mouth daily., Disp: , Rfl:    potassium chloride  (KLOR-CON  M) 10 MEQ tablet, Take 1 tablet (10 mEq total) by mouth daily., Disp: 90 tablet, Rfl: 2   varenicline  (CHANTIX ) 0.5 MG tablet, Take 1 tablet (0.5 mg total) by mouth 2 (two) times daily for 7 days, THEN 2 tablets (1 mg total) 2 (two) times daily. Pick a quit date and start medication 1 week prior to quit date., Disp: 222 tablet, Rfl: 0   vitamin B-12 (CYANOCOBALAMIN ) 100 MCG tablet, Take 100 mcg by mouth daily., Disp: , Rfl:    albuterol  (  VENTOLIN  HFA) 108 (90 Base) MCG/ACT inhaler, Inhale 2 puffs into the lungs every 6 (six) hours as needed for wheezing or shortness of breath., Disp: 8 g, Rfl: 0   atenolol  (TENORMIN ) 100 MG tablet, Take 1 tablet (100 mg total) by mouth daily., Disp: 90 tablet, Rfl: 0   hydrochlorothiazide  (HYDRODIURIL ) 25 MG tablet, Take 1 tablet (25 mg total) by mouth daily., Disp: 90 tablet, Rfl: 0   lisinopril  (ZESTRIL ) 40 MG tablet, Take 1 tablet (40 mg total) by mouth daily. Need appointment for further refills., Disp: 90 tablet, Rfl: 0   rosuvastatin  (CRESTOR ) 10 MG tablet, Take 1 tablet (10 mg total) by mouth daily. Need appointment for further refills., Disp: 90 tablet, Rfl: 0   traZODone   (DESYREL ) 50 MG tablet, Take 50 mg by mouth at bedtime as needed for sleep. (Patient not taking: Reported on 02/18/2023), Disp: , Rfl:    Objective:     BP (!) 160/98 (Cuff Size: Normal)   Pulse 84   Temp (!) 96.8 F (36 C) (Temporal)   Ht 5\' 6"  (1.676 m)   Wt 239 lb 12.8 oz (108.8 kg)   SpO2 97%   BMI 38.70 kg/m  BP Readings from Last 3 Encounters:  07/23/23 (!) 160/98  10/02/22 131/80  06/05/22 118/76   Wt Readings from Last 3 Encounters:  07/23/23 239 lb 12.8 oz (108.8 kg)  10/02/22 241 lb (109.3 kg)  06/05/22 235 lb (106.6 kg)      Physical Exam Constitutional:      General: She is not in acute distress.    Appearance: Normal appearance. She is not ill-appearing, toxic-appearing or diaphoretic.  HENT:     Head: Normocephalic and atraumatic.     Right Ear: External ear normal.     Left Ear: External ear normal.     Mouth/Throat:     Mouth: Mucous membranes are moist.     Pharynx: Oropharynx is clear. No oropharyngeal exudate or posterior oropharyngeal erythema.  Eyes:     General: No scleral icterus.       Right eye: No discharge.        Left eye: No discharge.     Extraocular Movements: Extraocular movements intact.     Conjunctiva/sclera: Conjunctivae normal.     Pupils: Pupils are equal, round, and reactive to light.  Cardiovascular:     Rate and Rhythm: Normal rate and regular rhythm.  Pulmonary:     Effort: Pulmonary effort is normal. No respiratory distress.     Breath sounds: Normal breath sounds.  Abdominal:     General: Bowel sounds are normal.     Tenderness: There is no abdominal tenderness. There is no guarding.  Musculoskeletal:     Cervical back: No rigidity or tenderness.     Right foot: Normal capillary refill. Tenderness and bony tenderness present. Normal pulse.       Legs:     Comments: Tenderness to palpation of lateral calcaneus near talus.  Skin:    General: Skin is warm and dry.  Neurological:     Mental Status: She is alert and  oriented to person, place, and time.  Psychiatric:        Mood and Affect: Mood normal.        Behavior: Behavior normal.      No results found for any visits on 07/23/23.    The 10-year ASCVD risk score (Arnett DK, et al., 2019) is: 21.9%    Assessment & Plan:   Tobacco  abuse -     Varenicline  Tartrate; Take 1 tablet (0.5 mg total) by mouth 2 (two) times daily for 7 days, THEN 2 tablets (1 mg total) 2 (two) times daily. Pick a quit date and start medication 1 week prior to quit date.  Dispense: 222 tablet; Refill: 0  ASCVD (arteriosclerotic cardiovascular disease) -     Rosuvastatin  Calcium ; Take 1 tablet (10 mg total) by mouth daily. Need appointment for further refills.  Dispense: 90 tablet; Refill: 0 -     Atenolol ; Take 1 tablet (100 mg total) by mouth daily.  Dispense: 90 tablet; Refill: 0 -     Comprehensive metabolic panel with GFR -     Lipid panel  Hyperlipidemia, unspecified hyperlipidemia type -     Rosuvastatin  Calcium ; Take 1 tablet (10 mg total) by mouth daily. Need appointment for further refills.  Dispense: 90 tablet; Refill: 0 -     Comprehensive metabolic panel with GFR -     Lipid panel  Essential hypertension -     Lisinopril ; Take 1 tablet (40 mg total) by mouth daily. Need appointment for further refills.  Dispense: 90 tablet; Refill: 0 -     hydroCHLOROthiazide ; Take 1 tablet (25 mg total) by mouth daily.  Dispense: 90 tablet; Refill: 0 -     Atenolol ; Take 1 tablet (100 mg total) by mouth daily.  Dispense: 90 tablet; Refill: 0 -     CBC -     Comprehensive metabolic panel with GFR -     Urinalysis, Routine w reflex microscopic  B12 deficiency -     Vitamin B12  Screening for diabetes mellitus -     Comprehensive metabolic panel with GFR -     Hemoglobin A1c  Mild intermittent reactive airway disease without complication -     Albuterol  Sulfate HFA; Inhale 2 puffs into the lungs every 6 (six) hours as needed for wheezing or shortness of breath.   Dispense: 8 g; Refill: 0  Recurrent boils -     Ambulatory referral to Dermatology  Pain of right heel -     Ambulatory referral to Sports Medicine -     DG Foot Complete Right; Future  Pain in left tibia -     Ambulatory referral to Sports Medicine -     DG Tibia/Fibula Left; Future    Return in about 3 months (around 10/23/2023).  Will try Chantix  again.  She will pick a quit date and start the medication 1 week prior to the quit date.  She will let us  know if she develops any anger, depression or has bad dreams.  Hopefully headaches will resolve after she restarts her blood pressure medications and her pressure normalizes.  Tonna Frederic, MD

## 2023-07-24 ENCOUNTER — Ambulatory Visit (INDEPENDENT_AMBULATORY_CARE_PROVIDER_SITE_OTHER)
Admission: RE | Admit: 2023-07-24 | Discharge: 2023-07-24 | Disposition: A | Discharge status: Home / Self Care | Source: Ambulatory Visit | Attending: Family Medicine | Admitting: Family Medicine

## 2023-07-24 DIAGNOSIS — M898X6 Other specified disorders of bone, lower leg: Secondary | ICD-10-CM

## 2023-07-24 DIAGNOSIS — M79671 Pain in right foot: Secondary | ICD-10-CM

## 2023-07-24 DIAGNOSIS — M19071 Primary osteoarthritis, right ankle and foot: Secondary | ICD-10-CM | POA: Diagnosis not present

## 2023-07-24 DIAGNOSIS — M7731 Calcaneal spur, right foot: Secondary | ICD-10-CM | POA: Diagnosis not present

## 2023-07-24 DIAGNOSIS — M25572 Pain in left ankle and joints of left foot: Secondary | ICD-10-CM | POA: Diagnosis not present

## 2023-07-30 ENCOUNTER — Telehealth: Payer: Self-pay | Admitting: Family Medicine

## 2023-07-30 NOTE — Telephone Encounter (Signed)
 Patient dropped off document Disability Parking Placard Form , to be filled out by provider. Patient requested to send it back via Call Patient to pick up within 7-days. Document is located in providers tray at front office.Please advise at Mobile [phone] (mobile)

## 2023-08-05 ENCOUNTER — Telehealth: Payer: Self-pay | Admitting: Acute Care

## 2023-08-05 ENCOUNTER — Ambulatory Visit: Payer: Self-pay | Admitting: Family Medicine

## 2023-08-05 NOTE — Addendum Note (Signed)
 Addended by: Delene Feinstein on: 08/05/2023 08:23 AM   Modules accepted: Orders

## 2023-08-05 NOTE — Telephone Encounter (Signed)
 Call report received:  IMPRESSION: 1. Lung-RADS 4B, suspicious. Additional imaging evaluation or consultation with Pulmonology or Thoracic Surgery recommended. New indistinct 8 mm peripheral left lower lobe pulmonary nodule, potentially infectious or inflammatory. Follow up low-dose chest CT without contrast in 3 months (please use the following order, "CT CHEST LCS NODULE FOLLOW-UP W/O CM") is recommended. 2. Two-vessel coronary atherosclerosis. 3. Dilated main pulmonary artery, suggesting pulmonary arterial hypertension. 4. Aortic Atherosclerosis (ICD10-I70.0) and Emphysema (ICD10-J43.9).   These results will be called to the ordering clinician or representative by the Radiologist Assistant, and communication documented in the PACS or [employer].     Electronically Signed   By: Levell Reach M.D.   On: 08/02/2023 18:00

## 2023-08-06 DIAGNOSIS — H524 Presbyopia: Secondary | ICD-10-CM | POA: Diagnosis not present

## 2023-08-06 DIAGNOSIS — H5203 Hypermetropia, bilateral: Secondary | ICD-10-CM | POA: Diagnosis not present

## 2023-08-06 DIAGNOSIS — H52223 Regular astigmatism, bilateral: Secondary | ICD-10-CM | POA: Diagnosis not present

## 2023-08-07 NOTE — Telephone Encounter (Signed)
 Dara Ear NP reviewed the scan. Due to the new nodule potentially being infectious or inflammatory she recommends the patient have a 3 month follow up scan to re-evaluate the nodules size. Will need to call the pt with the results and recs. Send results and plan to PCP.

## 2023-08-08 ENCOUNTER — Other Ambulatory Visit: Payer: Self-pay

## 2023-08-08 DIAGNOSIS — Z87891 Personal history of nicotine dependence: Secondary | ICD-10-CM

## 2023-08-08 DIAGNOSIS — R911 Solitary pulmonary nodule: Secondary | ICD-10-CM

## 2023-08-08 DIAGNOSIS — Z122 Encounter for screening for malignant neoplasm of respiratory organs: Secondary | ICD-10-CM

## 2023-08-08 NOTE — Telephone Encounter (Signed)
 I have called and spoken with the patient. Recent Lung CT results reviewed. She is in agreement to complete a 3 month f/u scan and evaluate the new 8 mm nodule. Pt states at time of previous scan she had a cough. 3 month f/u scan scheduled for 10/18/2023 at [facility] at 8:30am. She has no additional questions. 3 month f/u order placed. Results and plan to PCP.

## 2023-08-14 ENCOUNTER — Telehealth: Payer: Self-pay

## 2023-08-14 NOTE — Telephone Encounter (Signed)
CLINICAL USE BELOW THIS LINE (use X to signify taken)  ____Form received and placed in providers office for signature. ____Form completed and faxed to LOA Dept. _X___Form completed notified pt ready for pick up ____Charge sheet & copy of form in front office folder for office supervisor.

## 2023-10-01 ENCOUNTER — Telehealth: Payer: Self-pay | Admitting: Pulmonary Disease

## 2023-10-01 NOTE — Telephone Encounter (Signed)
 Pt was told her LCS CT's would be free for life on this government program. She was charged for the last one. Please call to advise why. Thanks.

## 2023-10-11 ENCOUNTER — Ambulatory Visit (HOSPITAL_BASED_OUTPATIENT_CLINIC_OR_DEPARTMENT_OTHER)
Admission: RE | Admit: 2023-10-11 | Discharge: 2023-10-11 | Disposition: A | Discharge status: Home / Self Care | Source: Ambulatory Visit | Attending: Acute Care | Admitting: Acute Care

## 2023-10-11 DIAGNOSIS — Z87891 Personal history of nicotine dependence: Secondary | ICD-10-CM

## 2023-10-11 DIAGNOSIS — R911 Solitary pulmonary nodule: Secondary | ICD-10-CM | POA: Diagnosis not present

## 2023-10-11 DIAGNOSIS — Z122 Encounter for screening for malignant neoplasm of respiratory organs: Secondary | ICD-10-CM

## 2023-10-11 DIAGNOSIS — J432 Centrilobular emphysema: Secondary | ICD-10-CM | POA: Diagnosis not present

## 2023-10-11 DIAGNOSIS — I700 Atherosclerosis of aorta: Secondary | ICD-10-CM | POA: Diagnosis not present

## 2023-10-21 ENCOUNTER — Other Ambulatory Visit: Payer: Self-pay | Admitting: Family Medicine

## 2023-10-21 DIAGNOSIS — E785 Hyperlipidemia, unspecified: Secondary | ICD-10-CM

## 2023-10-21 DIAGNOSIS — I10 Essential (primary) hypertension: Secondary | ICD-10-CM

## 2023-10-21 DIAGNOSIS — I2510 Atherosclerotic heart disease of native coronary artery without angina pectoris: Secondary | ICD-10-CM

## 2023-10-23 ENCOUNTER — Ambulatory Visit: Admitting: Acute Care

## 2023-10-23 ENCOUNTER — Other Ambulatory Visit: Payer: Self-pay

## 2023-10-23 ENCOUNTER — Encounter: Payer: Self-pay | Admitting: Acute Care

## 2023-10-23 VITALS — BP 134/78 | HR 86 | Temp 98.2°F | Ht 67.0 in | Wt 246.2 lb

## 2023-10-23 DIAGNOSIS — F17210 Nicotine dependence, cigarettes, uncomplicated: Secondary | ICD-10-CM

## 2023-10-23 DIAGNOSIS — Z122 Encounter for screening for malignant neoplasm of respiratory organs: Secondary | ICD-10-CM

## 2023-10-23 DIAGNOSIS — R911 Solitary pulmonary nodule: Secondary | ICD-10-CM

## 2023-10-23 DIAGNOSIS — Z09 Encounter for follow-up examination after completed treatment for conditions other than malignant neoplasm: Secondary | ICD-10-CM

## 2023-10-23 DIAGNOSIS — Z87891 Personal history of nicotine dependence: Secondary | ICD-10-CM

## 2023-10-23 DIAGNOSIS — F17200 Nicotine dependence, unspecified, uncomplicated: Secondary | ICD-10-CM

## 2023-10-23 DIAGNOSIS — R9389 Abnormal findings on diagnostic imaging of other specified body structures: Secondary | ICD-10-CM

## 2023-10-23 DIAGNOSIS — I517 Cardiomegaly: Secondary | ICD-10-CM

## 2023-10-23 NOTE — Progress Notes (Signed)
 History of Present Illness Kathy Herrera is a 63 y.o. female current some day smoker ( 55 pack year smoking history) followed through the lung cancer screening program here today for consult for abnormal lung cancer screening scan. She will be followed by Dr. Shelah. .    10/23/2023 Discussed the use of AI scribe software for clinical note transcription with the patient, who gave verbal consent to proceed.  History of Present Illness Patient presents for follow-up to review 64-month follow-up low-dose screening CT.  Previous low-dose CT done 07/10/2023 was read as a lung RADS 4B.  She had a new indistinct 8 mm peripheral left lower lobe pulmonary nodule noted on this scan.  Plan was to do a 59-month follow-up as there were indicators that this might be infectious or inflammatory.  Patient is here to review the follow-up scan results.  We have reviewed the scan results.  The 8 mm left lower lobe pulmonary nodule of concern has completely resolved.  This lends itself more to a benign infectious or inflammatory etiology.  Patient states she has almost quit smoking.  She just has a cigarette every now and again.  I have asked her to keep this up with a goal of complete smoking cessation.  We discussed use of nicotine  replacement therapy as well as sucking on sugar-free hard candies to help her move toward total cessation.  She verbalized understanding and she continues to work on this.  We did discuss smoking cessation for 3 to 4 minutes during this visit today.  There was notation of cardiomegaly as well as age advanced coronary artery disease on her scan.  She is currently on Crestor  through her PCP however I have gone ahead and referred her to cardiology to be evaluated.  Patient understands the rationale behind the referral and is in agreement with the plan.  Patient will return to the lung cancer screening program with next scan due May 2026.  I have asked her to return to see us  sooner if she has any  unexplained weight loss or develops any hemoptysis in the interim.  I have also asked her to continue working on smoking cessation.  If she feels she needs additional Chantix  starter pack I have offered to send that in for her.  She currently would like to work on this on her own.     Test Results: LDCT 10/11/2023 Cardiovascular: Aortic atherosclerosis. Mild cardiomegaly, without pericardial effusion. Lad coronary artery calcification.   Pulmonary artery enlargement, outflow tract 3.6 cm.   Mediastinum/Nodes: No mediastinal or hilar adenopathy, given limitations of unenhanced CT. Moderate left hemidiaphragm elevation.   Lungs/Pleura: No pleural fluid. Mild centrilobular emphysema. The left lower lobe density of interest has resolved and was likely infectious/inflammatory.   Calcified and noncalcified pulmonary nodules are otherwise similar. The solid nodule measures 2.6 mm in the right upper lobe.   Upper Abdomen: Cholecystectomy.   Musculoskeletal: Thoracic spondylosis.   IMPRESSION: 1. Lung-RADS 2, benign appearance or behavior. Continue annual screening with low-dose chest CT without contrast in 12 months. 2. Pulmonary artery enlargement suggests pulmonary arterial hypertension. 3. Aortic Atherosclerosis (ICD10-I70.0) and Emphysema (ICD10-J43.9). 4. Age advanced coronary artery atherosclerosis. Recommend assessment of coronary risk factors.  LDCT 07/10/2023 Cardiovascular: Normal heart size. No significant pericardial effusion/thickening. Left anterior descending and right coronary atherosclerosis. Atherosclerotic nonaneurysmal thoracic aorta. Dilated main pulmonary artery (4.2 cm diameter).   Mediastinum/Nodes: No significant thyroid  nodules. Unremarkable esophagus. No pathologically enlarged axillary, mediastinal or hilar lymph nodes, noting limited  sensitivity for the detection of hilar adenopathy on this noncontrast study.   Lungs/Pleura: No pneumothorax. No pleural  effusion. Mild centrilobular emphysema with diffuse bronchial wall thickening. No acute consolidative airspace disease or lung masses. Stable 5 mm solid peripheral right upper lobe pulmonary nodule on series 9/image 71. New indistinct 8 mm peripheral left lower lobe pulmonary nodule on series 9/image 145. No additional new significant pulmonary nodules.   Upper abdomen: No acute abnormality.   Musculoskeletal: No aggressive appearing focal osseous lesions. Marked thoracic spondylosis.   IMPRESSION: 1. Lung-RADS 4B, suspicious. Additional imaging evaluation or consultation with Pulmonology or Thoracic Surgery recommended. New indistinct 8 mm peripheral left lower lobe pulmonary nodule, potentially infectious or inflammatory. Follow up low-dose chest CT without contrast in 3 months (please use the following order, CT CHEST LCS NODULE FOLLOW-UP W/O CM) is recommended. 2. Two-vessel coronary atherosclerosis. 3. Dilated main pulmonary artery, suggesting pulmonary arterial hypertension. 4. Aortic Atherosclerosis (ICD10-I70.0) and Emphysema (ICD10-J43.9).      Latest Ref Rng & Units 07/23/2023   10:12 AM 06/05/2022   10:49 AM 02/28/2022    2:56 AM  CBC  WBC 4.0 - 10.5 K/uL 7.0  8.7  16.4   Hemoglobin 12.0 - 15.0 g/dL 83.1  84.3  86.1   Hematocrit 36.0 - 46.0 % 52.4  47.1  43.1   Platelets 150.0 - 400.0 K/uL 201.0  223.0  196        Latest Ref Rng & Units 07/23/2023   10:12 AM 06/05/2022   10:49 AM 02/28/2022    2:56 AM  BMP  Glucose 70 - 99 mg/dL 86  93  828   BUN 6 - 23 mg/dL 14  15  20    Creatinine 0.40 - 1.20 mg/dL 8.94  8.86  8.58   Sodium 135 - 145 mEq/L 140  140  136   Potassium 3.5 - 5.1 mEq/L 4.2  4.2  4.2   Chloride 96 - 112 mEq/L 101  102  101   CO2 19 - 32 mEq/L 28  30  26    Calcium  8.4 - 10.5 mg/dL 89.5  89.5  9.2     BNP No results found for: BNP  ProBNP No results found for: PROBNP  PFT No results found for: FEV1PRE, FEV1POST, FVCPRE,  FVCPOST, TLC, DLCOUNC, PREFEV1FVCRT, PSTFEV1FVCRT  CT CHEST LCS NODULE F/U LOW DOSE WO CONTRAST Result Date: 10/23/2023 CLINICAL DATA:  Follow-up of lung cancer screening exam EXAM: CT CHEST WITHOUT CONTRAST FOR LUNG CANCER SCREENING NODULE FOLLOW-UP TECHNIQUE: Multidetector CT imaging of the chest was performed following the standard protocol without IV contrast. RADIATION DOSE REDUCTION: This exam was performed according to the departmental dose-optimization program which includes automated exposure control, adjustment of the mA and/or kV according to patient size and/or use of iterative reconstruction technique. COMPARISON:  07/10/2023 FINDINGS: Cardiovascular: Aortic atherosclerosis. Mild cardiomegaly, without pericardial effusion. Lad coronary artery calcification. Pulmonary artery enlargement, outflow tract 3.6 cm. Mediastinum/Nodes: No mediastinal or hilar adenopathy, given limitations of unenhanced CT. Moderate left hemidiaphragm elevation. Lungs/Pleura: No pleural fluid. Mild centrilobular emphysema. The left lower lobe density of interest has resolved and was likely infectious/inflammatory. Calcified and noncalcified pulmonary nodules are otherwise similar. The solid nodule measures 2.6 mm in the right upper lobe. Upper Abdomen: Cholecystectomy. Musculoskeletal: Thoracic spondylosis. IMPRESSION: 1. Lung-RADS 2, benign appearance or behavior. Continue annual screening with low-dose chest CT without contrast in 12 months. 2. Pulmonary artery enlargement suggests pulmonary arterial hypertension. 3. Aortic Atherosclerosis (ICD10-I70.0) and Emphysema (ICD10-J43.9). 4. Age  advanced coronary artery atherosclerosis. Recommend assessment of coronary risk factors. Electronically Signed   By: Rockey Kilts M.D.   On: 10/23/2023 11:04     Past medical hx Past Medical History:  Diagnosis Date   AAA (abdominal aortic aneurysm) (HCC)    Crohn disease (HCC)    Depression    History. No current  problems   DJD (degenerative joint disease) of lumbar spine    Dyspnea    With Activity   GERD (gastroesophageal reflux disease)    no current problems   Heart murmur    patient denies, [place] up here   Heart murmur    no current problems   Hyperlipidemia    Hypertension    Hyperthyroidism    Lactose intolerance    Perforated viscus 07/2015   Peripheral vascular disease (HCC)    Rheumatoid arthritis (HCC)    STD (sexually transmitted disease)    Condyloma   Thyroid  disorder    Vitamin D  deficiency      Social History   Tobacco Use   Smoking status: Every Day    Current packs/day: 0.50    Average packs/day: 0.5 packs/day for 55.4 years (27.7 ttl pk-yrs)    Types: Cigars, Cigarettes    Start date: 05/27/1968    Passive exposure: Never   Smokeless tobacco: Never   Tobacco comments:    Quit smoking 2 weeks ago  Vaping Use   Vaping status: Never Used  Substance Use Topics   Alcohol use: Not Currently    Comment: rarely   Drug use: No    Ms.Santiago reports that she has been smoking cigars and cigarettes. She started smoking about 55 years ago. She has a 27.7 pack-year smoking history. She has never been exposed to tobacco smoke. She has never used smokeless tobacco. She reports that she does not currently use alcohol. She reports that she does not use drugs.  Tobacco Cessation: Ready to quit: Not Answered Counseling given: Not Answered Tobacco comments: Quit smoking 2 weeks ago Current someday smoker I spent 3-4 minutes counseling patient on  steps to stop use of tobacco products. I have provided patient with information on receiving free nicotine  replacement therapy, and contact numbers for hypnosis for smoking cessation as well as acupuncture for smoking cessation.   Past surgical hx, Family hx, Social hx all reviewed.  Current Outpatient Medications on File Prior to Visit  Medication Sig   acetaminophen  (TYLENOL ) 500 MG  tablet Take 1,000 mg by mouth every 6 (six) hours as needed for moderate pain.   albuterol  (VENTOLIN  HFA) 108 (90 Base) MCG/ACT inhaler Inhale 2 puffs into the lungs every 6 (six) hours as needed for wheezing or shortness of breath.   aspirin  81 MG chewable tablet Chew 1 tablet (81 mg total) by mouth 2 (two) times daily.   atenolol  (TENORMIN ) 100 MG tablet Take 1 tablet (100 mg total) by mouth daily.   cholecalciferol  (VITAMIN D ) 25 MCG (1000 UNIT) tablet Take 1,000 Units by mouth daily.   hydrochlorothiazide  (HYDRODIURIL ) 25 MG tablet Take 1 tablet (25 mg total) by mouth daily.   lisinopril  (ZESTRIL ) 40 MG tablet TAKE 1 TABLET BY MOUTH ONCE DAILY . APPOINTMENT REQUIRED FOR FUTURE REFILLS   MELATONIN PO Take 1 tablet by mouth at bedtime as needed (sleep).   Multiple Vitamins-Minerals (MULTIVITAMIN WITH MINERALS) tablet Take 1 tablet by mouth daily.   potassium chloride  (KLOR-CON  M) 10 MEQ tablet Take 1 tablet (10  mEq total) by mouth daily.   rosuvastatin  (CRESTOR ) 10 MG tablet TAKE 1 TABLET BY MOUTH ONCE DAILY . APPOINTMENT REQUIRED FOR FUTURE REFILLS   vitamin B-12 (CYANOCOBALAMIN ) 100 MCG tablet Take 100 mcg by mouth daily.   traZODone  (DESYREL ) 50 MG tablet Take 50 mg by mouth at bedtime as needed for sleep. (Patient not taking: Reported on 10/23/2023)   No current facility-administered medications on file prior to visit.     Allergies  Allergen Reactions   Shrimp [Shellfish Allergy] Hives    May be seasoning not always   Percocet [Oxycodone -Acetaminophen ] Nausea Only    Review Of Systems:  Constitutional:   No  weight loss, night sweats,  Fevers, chills, fatigue, or  lassitude.  HEENT:   No headaches,  Difficulty swallowing,  Tooth/dental problems, or  Sore throat,                No sneezing, itching, ear ache, nasal congestion, post nasal drip,   CV:  No chest pain,  Orthopnea, PND, swelling in lower extremities, anasarca, dizziness, palpitations, syncope.   GI  No heartburn,  indigestion, abdominal pain, nausea, vomiting, diarrhea, change in bowel habits, loss of appetite, bloody stools.   Resp: No shortness of breath with exertion or at rest.  No excess mucus, no productive cough,  No non-productive cough,  No coughing up of blood.  No change in color of mucus.  No wheezing.  No chest wall deformity  Skin: no rash or lesions.  GU: no dysuria, change in color of urine, no urgency or frequency.  No flank pain, no hematuria   MS:  No joint pain or swelling.  No decreased range of motion.  No back pain.  Psych:  No change in mood or affect. No depression or anxiety.  No memory loss.   Vital Signs BP 134/78   Pulse 86   Temp 98.2 F (36.8 C) (Temporal)   Ht 5' 7 (1.702 m)   Wt 246 lb 3.2 oz (111.7 kg)   SpO2 93%   BMI 38.56 kg/m    Physical Exam:  General- No distress,  A&Ox3, pleasant ENT: No sinus tenderness, TM clear, pale nasal mucosa, no oral exudate,no post nasal drip, no LAN Cardiac: S1, S2, regular rate and rhythm, no murmur Chest: No wheeze/ rales/ dullness; no accessory muscle use, no nasal flaring, no sternal retractions, slightly diminished per bases Abd.: Soft Non-tender, ND, BS +, Body mass index is 38.56 kg/m.  Ext: No clubbing cyanosis, edema, no obvious deformities Neuro:  Physical deconditioning, MAE x 4, A&O x 3, appropriate, walks with a walker Skin: No rashes, warm and dry, no obvious skin lesions  Psych: normal mood and behavior    Assessment & Plan Abnormal Lung cancer screening scan 07/23/2023 Current someday smoker with a 55-pack-year smoking history 72-month follow-up low-dose CT with resolution of nodule of concern Mild cardiomegaly Age advanced coronary artery disease Plan Your follow up CT Chest showed the nodule of concern has resolved. This is great news. You will resume lung cancer screening 07/2024. You will get a call closer to the time to get this scheduled. Keep working on quitting smoking completely. It  sounds like you are getting there.  Try switching to sugar free hard candies to help. Let me know if you would like another Chantix  starter pack. I have referred you to cardiology due to an mildly enlarged heart and for age advanced coronary artery disease. You will get a call to get this scheduled.  They will take great care of you.  Call if you need us  sooner. Please contact office for sooner follow up if symptoms do not improve or worsen or seek emergency care     I spent 25 minutes dedicated to the care of this patient on the date of this encounter to include pre-visit review of records, face-to-face time with the patient discussing conditions above, post visit ordering of testing, clinical documentation with the electronic health record, making appropriate referrals as documented, and communicating necessary information to the patient's healthcare team.     Lauraine JULIANNA Lites, NP 10/23/2023  11:45 AM

## 2023-10-23 NOTE — Patient Instructions (Addendum)
 It is good to see you today. Your follow up CT Chest showed the nodule of concern has resolved. This is great news. You will resume lung cancer screening 07/2024. You will get a call closer to the time to get this scheduled. Keep working on quitting smoking completely. It sounds like you are getting there.  Try switching to sugar free hard candies to help. Let me know if you would like another Chantix  starter pack. I have referred you to cardiology due to an mildly enlarged heart and for age advanced coronary artery disease. You will get a call to get this scheduled. They will take great care of you.  Call if you need us  sooner. Please contact office for sooner follow up if symptoms do not improve or worsen or seek emergency care

## 2023-10-24 ENCOUNTER — Encounter: Payer: Self-pay | Admitting: Family Medicine

## 2023-10-24 ENCOUNTER — Ambulatory Visit (INDEPENDENT_AMBULATORY_CARE_PROVIDER_SITE_OTHER): Admitting: Family Medicine

## 2023-10-24 ENCOUNTER — Other Ambulatory Visit: Payer: Self-pay | Admitting: Family Medicine

## 2023-10-24 ENCOUNTER — Other Ambulatory Visit: Payer: Self-pay

## 2023-10-24 ENCOUNTER — Ambulatory Visit: Payer: Self-pay | Admitting: Family Medicine

## 2023-10-24 VITALS — BP 128/78 | HR 65 | Temp 97.6°F | Ht 67.0 in | Wt 246.2 lb

## 2023-10-24 DIAGNOSIS — I10 Essential (primary) hypertension: Secondary | ICD-10-CM

## 2023-10-24 DIAGNOSIS — M545 Low back pain, unspecified: Secondary | ICD-10-CM | POA: Diagnosis not present

## 2023-10-24 DIAGNOSIS — E538 Deficiency of other specified B group vitamins: Secondary | ICD-10-CM

## 2023-10-24 DIAGNOSIS — M25512 Pain in left shoulder: Secondary | ICD-10-CM

## 2023-10-24 DIAGNOSIS — Z720 Tobacco use: Secondary | ICD-10-CM | POA: Diagnosis not present

## 2023-10-24 DIAGNOSIS — R7989 Other specified abnormal findings of blood chemistry: Secondary | ICD-10-CM

## 2023-10-24 DIAGNOSIS — E785 Hyperlipidemia, unspecified: Secondary | ICD-10-CM | POA: Diagnosis not present

## 2023-10-24 DIAGNOSIS — I2510 Atherosclerotic heart disease of native coronary artery without angina pectoris: Secondary | ICD-10-CM

## 2023-10-24 DIAGNOSIS — I714 Abdominal aortic aneurysm, without rupture, unspecified: Secondary | ICD-10-CM

## 2023-10-24 DIAGNOSIS — E039 Hypothyroidism, unspecified: Secondary | ICD-10-CM

## 2023-10-24 DIAGNOSIS — R7303 Prediabetes: Secondary | ICD-10-CM

## 2023-10-24 LAB — LIPID PANEL
Cholesterol: 158 mg/dL (ref 0–200)
HDL: 52.8 mg/dL (ref >39.00)
LDL Cholesterol: 68 mg/dL (ref 0–99)
NonHDL: 104.75
Total CHOL/HDL Ratio: 3
Triglycerides: 182 mg/dL — ABNORMAL HIGH (ref 0.0–149.0)
VLDL: 36.4 mg/dL (ref 0.0–40.0)

## 2023-10-24 LAB — COMPREHENSIVE METABOLIC PANEL WITH GFR
ALT: 15 U/L (ref 0–35)
AST: 17 U/L (ref 0–37)
Albumin: 4.1 g/dL (ref 3.5–5.2)
Alkaline Phosphatase: 85 U/L (ref 39–117)
BUN: 16 mg/dL (ref 6–23)
CO2: 32 meq/L (ref 19–32)
Calcium: 9.9 mg/dL (ref 8.4–10.5)
Chloride: 98 meq/L (ref 96–112)
Creatinine, Ser: 1.1 mg/dL (ref 0.40–1.20)
GFR: 53.09 mL/min — ABNORMAL LOW (ref >60.00)
Glucose, Bld: 85 mg/dL (ref 70–99)
Potassium: 4.1 meq/L (ref 3.5–5.1)
Sodium: 141 meq/L (ref 135–145)
Total Bilirubin: 0.5 mg/dL (ref 0.2–1.2)
Total Protein: 7.7 g/dL (ref 6.0–8.3)

## 2023-10-24 LAB — URINALYSIS, ROUTINE W REFLEX MICROSCOPIC
Bilirubin Urine: NEGATIVE
Hgb urine dipstick: NEGATIVE
Ketones, ur: NEGATIVE
Leukocytes,Ua: NEGATIVE
Nitrite: NEGATIVE
Specific Gravity, Urine: <=1.005 — AB (ref 1.000–1.030)
Total Protein, Urine: NEGATIVE
Urine Glucose: NEGATIVE
Urobilinogen, UA: 1 (ref 0.0–1.0)
WBC, UA: NONE SEEN (ref 0–?)
pH: 6.5 (ref 5.0–8.0)

## 2023-10-24 LAB — VITAMIN B12: Vitamin B-12: 820 pg/mL (ref 211–911)

## 2023-10-24 LAB — T4, FREE: Free T4: 0.52 ng/dL — ABNORMAL LOW (ref 0.60–1.60)

## 2023-10-24 LAB — TSH: TSH: 11.27 u[IU]/mL — ABNORMAL HIGH (ref 0.35–5.50)

## 2023-10-24 LAB — T3, FREE: T3, Free: 3.2 pg/mL (ref 2.3–4.2)

## 2023-10-24 LAB — HEMOGLOBIN A1C: Hgb A1c MFr Bld: 6.5 % (ref 4.6–6.5)

## 2023-10-24 MED ORDER — METFORMIN HCL ER 500 MG PO TB24: 500.0000 mg | ORAL_TABLET | Freq: Every evening | ORAL | 1 refills | Status: AC

## 2023-10-24 MED ORDER — VARENICLINE TARTRATE 1 MG PO TABS: 1.0000 mg | ORAL_TABLET | Freq: Two times a day (BID) | ORAL | 2 refills | Status: AC

## 2023-10-24 NOTE — Progress Notes (Addendum)
 Established Patient Office Visit   Subjective:  Patient ID: Kathy Herrera, female    DOB: 1960/04/12  Age: 63 y.o. MRN: [id number]  Chief Complaint  Patient presents with   Medical Management of Chronic Issues    3 month follow up. Pt is fasting. Pt states Dr Berneta was going to take over her thyroid  meds and wants to have TSH checked today. B12 levels are 942 pt wants to know if she should still take prescribed B12.    Back Pain    Bilateral lower side back pain  x 1 month.    Shoulder Pain    Left shoulder pain x 1 month.     Back Pain Pertinent negatives include no abdominal pain, tingling or weakness.  Shoulder Pain  Pertinent negatives include no tingling.   Encounter Diagnoses  Name Primary?   Essential hypertension Yes   ASCVD (arteriosclerotic cardiovascular disease)    Hyperlipidemia, unspecified hyperlipidemia type    Tobacco abuse    B12 deficiency    Abnormal TSH    Prediabetes    Bilateral low back pain, unspecified chronicity, unspecified whether sciatica present    Left shoulder pain, unspecified chronicity    Morbid obesity (HCC)    Hypothyroidism, unspecified type    For follow-up of above.  Blood pressure well-controlled with current regimen.  Recent screening low-dose CT of chest revealed stable pulmonary nodule but did show mild cardiomegaly, pulmonary artery dilation and calcification of her LAD.  She has been referred to cardiology.  Chantix  helped with smoking cessation.  She is down to 4 cigarettes weekly.  She had no issues taking the medication.  Denied bad dreams or anger issues.  History of a thyroid  problem.  TSH levels have been normal.  She is taking no thyroid  replacement hormone.  Overdue for GYN check and mammogram.  Complains of left shoulder and bilateral lower back pain.   Review of Systems  Constitutional: Negative.   HENT: Negative.    Eyes:  Negative for blurred vision, discharge and redness.  Respiratory: Negative.     Cardiovascular: Negative.   Gastrointestinal:  Negative for abdominal pain.  Genitourinary: Negative.   Musculoskeletal:  Positive for back pain and joint pain. Negative for myalgias.  Skin:  Negative for rash.  Neurological:  Negative for tingling, loss of consciousness and weakness.  Endo/Heme/Allergies:  Negative for polydipsia.     Current Outpatient Medications:    acetaminophen  (TYLENOL ) 500 MG tablet, Take 1,000 mg by mouth every 6 (six) hours as needed for moderate pain., Disp: , Rfl:    albuterol  (VENTOLIN  HFA) 108 (90 Base) MCG/ACT inhaler, Inhale 2 puffs into the lungs every 6 (six) hours as needed for wheezing or shortness of breath., Disp: 8 g, Rfl: 0   aspirin  81 MG chewable tablet, Chew 1 tablet (81 mg total) by mouth 2 (two) times daily., Disp: 30 tablet, Rfl: 0   cholecalciferol  (VITAMIN D ) 25 MCG (1000 UNIT) tablet, Take 1,000 Units by mouth daily., Disp: , Rfl:    levothyroxine  (SYNTHROID ) 50 MCG tablet, Take 1 tablet (50 mcg total) by mouth daily., Disp: 90 tablet, Rfl: 0   lisinopril  (ZESTRIL ) 40 MG tablet, TAKE 1 TABLET BY MOUTH ONCE DAILY . APPOINTMENT REQUIRED FOR FUTURE REFILLS, Disp: 90 tablet, Rfl: 0   MELATONIN PO, Take 1 tablet by mouth at bedtime as needed (sleep)., Disp: , Rfl:    metFORMIN  (GLUCOPHAGE -XR) 500 MG 24 hr tablet, Take 1 tablet (500 mg total) by mouth at  bedtime., Disp: 90 tablet, Rfl: 1   Multiple Vitamins-Minerals (MULTIVITAMIN WITH MINERALS) tablet, Take 1 tablet by mouth daily., Disp: , Rfl:    potassium chloride  (KLOR-CON  M) 10 MEQ tablet, Take 1 tablet (10 mEq total) by mouth daily., Disp: 90 tablet, Rfl: 2   rosuvastatin  (CRESTOR ) 10 MG tablet, TAKE 1 TABLET BY MOUTH ONCE DAILY . APPOINTMENT REQUIRED FOR FUTURE REFILLS, Disp: 90 tablet, Rfl: 0   varenicline  (CHANTIX  CONTINUING MONTH PAK) 1 MG tablet, Take 1 tablet (1 mg total) by mouth 2 (two) times daily., Disp: 60 tablet, Rfl: 2   vitamin B-12 (CYANOCOBALAMIN ) 100 MCG tablet, Take 100 mcg  by mouth daily., Disp: , Rfl:    atenolol  (TENORMIN ) 100 MG tablet, Take 1 tablet by mouth once daily, Disp: 90 tablet, Rfl: 0   hydrochlorothiazide  (HYDRODIURIL ) 25 MG tablet, Take 1 tablet by mouth once daily, Disp: 90 tablet, Rfl: 0   lidocaine  (LIDODERM ) 5 %, Place 1 patch onto the skin daily. Remove & Discard patch within 12 hours or as directed by MD, Disp: 30 patch, Rfl: 0   traZODone  (DESYREL ) 50 MG tablet, Take 50 mg by mouth at bedtime as needed for sleep. (Patient not taking: Reported on 10/24/2023), Disp: , Rfl:    Objective:     BP 128/78 (BP Location: Right Arm, Patient Position: Sitting, Cuff Size: Normal)   Pulse 65   Temp 97.6 F (36.4 C) (Temporal)   Ht 5' 7 (1.702 m)   Wt 246 lb 3.2 oz (111.7 kg)   SpO2 96%   BMI 38.56 kg/m    Physical Exam Constitutional:      General: She is not in acute distress.    Appearance: Normal appearance. She is obese. She is not ill-appearing, toxic-appearing or diaphoretic.  HENT:     Head: Normocephalic and atraumatic.     Right Ear: External ear normal.     Left Ear: External ear normal.     Mouth/Throat:     Mouth: Mucous membranes are moist.     Pharynx: Oropharynx is clear. No oropharyngeal exudate or posterior oropharyngeal erythema.  Eyes:     General: No scleral icterus.       Right eye: No discharge.        Left eye: No discharge.     Extraocular Movements: Extraocular movements intact.     Conjunctiva/sclera: Conjunctivae normal.     Pupils: Pupils are equal, round, and reactive to light.  Cardiovascular:     Rate and Rhythm: Normal rate and regular rhythm.  Pulmonary:     Effort: Pulmonary effort is normal. No respiratory distress.     Breath sounds: Normal breath sounds. No wheezing or rales.  Musculoskeletal:     Cervical back: No rigidity or tenderness.  Skin:    General: Skin is warm and dry.  Neurological:     Mental Status: She is alert and oriented to person, place, and time.  Psychiatric:        Mood  and Affect: Mood normal.        Behavior: Behavior normal.      Results for orders placed or performed in visit on 10/24/23  Vitamin B12  Result Value Ref Range   Vitamin B-12 820 211 - 911 pg/mL  Comprehensive metabolic panel with GFR  Result Value Ref Range   Sodium 141 135 - 145 mEq/L   Potassium 4.1 3.5 - 5.1 mEq/L   Chloride 98 96 - 112 mEq/L   CO2 32 19 -  32 mEq/L   Glucose, Bld 85 70 - 99 mg/dL   BUN 16 6 - 23 mg/dL   Creatinine, Ser 8.89 0.40 - 1.20 mg/dL   Total Bilirubin 0.5 0.2 - 1.2 mg/dL   Alkaline Phosphatase 85 39 - 117 U/L   AST 17 0 - 37 U/L   ALT 15 0 - 35 U/L   Total Protein 7.7 6.0 - 8.3 g/dL   Albumin  4.1 3.5 - 5.2 g/dL   GFR 46.90 (L) >39.99 mL/min   Calcium  9.9 8.4 - 10.5 mg/dL  Lipid panel  Result Value Ref Range   Cholesterol 158 0 - 200 mg/dL   Triglycerides 817.9 (H) 0.0 - 149.0 mg/dL   HDL 47.19 >60.99 mg/dL   VLDL 63.5 0.0 - 59.9 mg/dL   LDL Cholesterol 68 0 - 99 mg/dL   Total CHOL/HDL Ratio 3    NonHDL 104.75   Urinalysis, Routine w reflex microscopic  Result Value Ref Range   Color, Urine YELLOW Yellow;Lt. Yellow;Straw;Dark Yellow;Amber;Green;Red;Brown   APPearance CLEAR Clear;Turbid;Slightly Cloudy;Cloudy   Specific Gravity, Urine <=1.005 (A) 1.000 - 1.030   pH 6.5 5.0 - 8.0   Total Protein, Urine NEGATIVE Negative   Urine Glucose NEGATIVE Negative   Ketones, ur NEGATIVE Negative   Bilirubin Urine NEGATIVE Negative   Hgb urine dipstick NEGATIVE Negative   Urobilinogen, UA 1.0 0.0 - 1.0   Leukocytes,Ua NEGATIVE Negative   Nitrite NEGATIVE Negative   WBC, UA none seen 0-2/hpf   RBC / HPF 0-2/hpf 0-2/hpf   Squamous Epithelial / HPF Rare(0-4/hpf) Rare(0-4/hpf)   Bacteria, UA Rare(<10/hpf) (A) None  TSH  Result Value Ref Range   TSH 11.27 (H) 0.35 - 5.50 uIU/mL  Hemoglobin A1c  Result Value Ref Range   Hgb A1c MFr Bld 6.5 4.6 - 6.5 %  T3, free  Result Value Ref Range   T3, Free 3.2 2.3 - 4.2 pg/mL  T4, free  Result Value Ref  Range   Free T4 0.52 (L) 0.60 - 1.60 ng/dL      The [id number] ASCVD risk score (Arnett DK, et al., 2019) is: 16.2%    Assessment & Plan:   Essential hypertension -     Comprehensive metabolic panel with GFR -     Urinalysis, Routine w reflex microscopic  ASCVD (arteriosclerotic cardiovascular disease) -     Lipid panel  Hyperlipidemia, unspecified hyperlipidemia type -     Lipid panel  Tobacco abuse -     Varenicline  Tartrate; Take 1 tablet (1 mg total) by mouth 2 (two) times daily.  Dispense: 60 tablet; Refill: 2  B12 deficiency -     Vitamin B12  Abnormal TSH -     TSH -     T3, free -     T4, free -     Anti-TPO Ab (RDL); Future  Prediabetes -     Hemoglobin A1c -     Amb Referral to Nutrition and Diabetic Education -     metFORMIN  HCl ER; Take 1 tablet (500 mg total) by mouth at bedtime.  Dispense: 90 tablet; Refill: 1  Bilateral low back pain, unspecified chronicity, unspecified whether sciatica present -     Ambulatory referral to Sports Medicine  Left shoulder pain, unspecified chronicity -     Ambulatory referral to Sports Medicine  Morbid obesity (HCC) -     Amb Referral to Nutrition and Diabetic Education  Hypothyroidism, unspecified type -     Levothyroxine  Sodium; Take 1 tablet (50  mcg total) by mouth daily.  Dispense: 90 tablet; Refill: 0    Return in about 3 months (around 01/24/2024).  Will continue Chantix  1 mg twice daily.  Continue smoking cessation efforts.  Information was given on preventing type 2 diabetes.  Referral for nutritional counseling for prediabetes and obesity.  She already has been referred to cardiology.  Sports medicine referral for shoulder and lower back pain.  Probably need to increase rosuvastatin .  Will gelled for mammogram.  Advised her to follow-up to her GYN provider.  She is status post hysterectomy but still has her cervix.  Elsie Sim Lent, MD   Addendum: A1c has increased.  Will send in prescription for  metformin .  TSH is elevated.  Will advised that it is most common side effect is perhaps some abdominal cramping and loose stooling tends to resolve within a few weeks for most people.  This is congruent with her past medical history of a thyroid  problem.  Will order TPO antibodies.  9/1 addendum: Patient does have hypothyroidism.  Have sent a prescription for levothyroxine  50 mcg daily before breakfast.  Please follow-up in 3 months instead of 6.

## 2023-10-24 NOTE — Addendum Note (Signed)
 Addended by: BERNETA ELSIE LABOR on: 10/24/2023 05:06 PM   Modules accepted: Orders

## 2023-10-30 ENCOUNTER — Other Ambulatory Visit (INDEPENDENT_AMBULATORY_CARE_PROVIDER_SITE_OTHER)

## 2023-10-30 ENCOUNTER — Ambulatory Visit

## 2023-10-30 ENCOUNTER — Ambulatory Visit: Payer: Self-pay

## 2023-10-30 VITALS — BP 138/73 | Ht 67.0 in | Wt 246.0 lb

## 2023-10-30 DIAGNOSIS — M25512 Pain in left shoulder: Secondary | ICD-10-CM

## 2023-10-30 DIAGNOSIS — R7989 Other specified abnormal findings of blood chemistry: Secondary | ICD-10-CM | POA: Diagnosis not present

## 2023-10-30 DIAGNOSIS — M19012 Primary osteoarthritis, left shoulder: Secondary | ICD-10-CM | POA: Diagnosis not present

## 2023-10-30 DIAGNOSIS — S39012A Strain of muscle, fascia and tendon of lower back, initial encounter: Secondary | ICD-10-CM | POA: Diagnosis not present

## 2023-10-30 DIAGNOSIS — S46009A Unspecified injury of muscle(s) and tendon(s) of the rotator cuff of unspecified shoulder, initial encounter: Secondary | ICD-10-CM

## 2023-10-30 MED ORDER — LIDOCAINE 5 % EX PTCH
1.0000 | MEDICATED_PATCH | CUTANEOUS | 0 refills | Status: AC
Start: 2023-10-30 — End: ?

## 2023-10-30 NOTE — Patient Instructions (Signed)
 You have a rotator cuff injury and chronic low back muscle strain We will provide home exercises for you to do as well as a return here in 4 weeks to see how you are doing Voltaren  gel can be applied to the back and shoulder up to 4 times a day  You can continue to use tylenol  You can also apply a lidocaine  patch to the areas that are painful

## 2023-10-30 NOTE — Progress Notes (Cosign Needed Addendum)
 PCP: Berneta Elsie Sayre, MD  Subjective:   HPI: Patient is a 63 y.o. female here for low back pain and left shoulder pain.  Patient states that both knees been present for the last 3 months without any known injury, trauma, fall or specific time where she can think that she had pain.  She does have a pertinent past medical history of low back of an L4-S1 fusion from spinal stenosis back in 2017.  Patient states that both sides of her lumbar region hurt, without radiation, she denies any tingling or numbness.  She states anytime she tried to get up from laying down or get up from a chair it is worse.  She is taking Tylenol .  She cannot take NSAIDs due to having Crohn's.  She has not had any recent physical therapy.  She denies any weakness in her legs.  Patient denies loss of bowel or bladder function.  She denies any groin numbness.  Patient's left shoulder without injury, trauma or fall, she has not had any previous surgeries on the shoulder.  She states that the pain is on the posterior side of her shoulder.  She notices the pain more when she is doing over the head type of movements.  Past Medical History:  Diagnosis Date   AAA (abdominal aortic aneurysm) (HCC)    Crohn disease (HCC)    Depression    History. No current problems   DJD (degenerative joint disease) of lumbar spine    Dyspnea    With Activity   GERD (gastroesophageal reflux disease)    no current problems   Heart murmur    patient denies, [place] here   Heart murmur    no current problems   Hyperlipidemia    Hypertension    Hyperthyroidism    Lactose intolerance    Perforated viscus 07/2015   Peripheral vascular disease (HCC)    Rheumatoid arthritis (HCC)    STD (sexually transmitted disease)    Condyloma   Thyroid  disorder    Vitamin D  deficiency     Current Outpatient Medications on File Prior to Visit  Medication Sig Dispense Refill   acetaminophen   (TYLENOL ) 500 MG tablet Take 1,000 mg by mouth every 6 (six) hours as needed for moderate pain.     albuterol  (VENTOLIN  HFA) 108 (90 Base) MCG/ACT inhaler Inhale 2 puffs into the lungs every 6 (six) hours as needed for wheezing or shortness of breath. 8 g 0   aspirin  81 MG chewable tablet Chew 1 tablet (81 mg total) by mouth 2 (two) times daily. 30 tablet 0   atenolol  (TENORMIN ) 100 MG tablet Take 1 tablet by mouth once daily 90 tablet 0   cholecalciferol  (VITAMIN D ) 25 MCG (1000 UNIT) tablet Take 1,000 Units by mouth daily.     hydrochlorothiazide  (HYDRODIURIL ) 25 MG tablet Take 1 tablet by mouth once daily 90 tablet 0   lisinopril  (ZESTRIL ) 40 MG tablet TAKE 1 TABLET BY MOUTH ONCE DAILY . APPOINTMENT REQUIRED FOR FUTURE REFILLS 90 tablet 0   MELATONIN PO Take 1 tablet by mouth at bedtime as needed (sleep).     metFORMIN  (GLUCOPHAGE -XR) 500 MG 24 hr tablet Take 1 tablet (500 mg total) by mouth at bedtime. 90 tablet 1   Multiple Vitamins-Minerals (MULTIVITAMIN WITH MINERALS) tablet Take 1 tablet by mouth daily.     potassium chloride  (KLOR-CON  M) 10 MEQ tablet Take 1 tablet (10 mEq total) by mouth daily. 90  tablet 2   rosuvastatin  (CRESTOR ) 10 MG tablet TAKE 1 TABLET BY MOUTH ONCE DAILY . APPOINTMENT REQUIRED FOR FUTURE REFILLS 90 tablet 0   traZODone  (DESYREL ) 50 MG tablet Take 50 mg by mouth at bedtime as needed for sleep. (Patient not taking: Reported on 10/24/2023)     varenicline  (CHANTIX  CONTINUING MONTH PAK) 1 MG tablet Take 1 tablet (1 mg total) by mouth 2 (two) times daily. 60 tablet 2   vitamin B-12 (CYANOCOBALAMIN ) 100 MCG tablet Take 100 mcg by mouth daily.     No current facility-administered medications on file prior to visit.    BP 138/73   Ht 5' 7 (1.702 m)   Wt 246 lb (111.6 kg)   BMI 38.53 kg/m        Objective:   Physical Exam:  Gen: NAD, comfortable in exam room Lumbar spine Inspection: No erythema, edema, or ulcers Palpation: Patient is minimally tender to  palpation over the bilateral paraspinal lumbar region, no spinous process tenderness ROM: Patient's flexion extension is limited Special Tests: Negative straight leg test bilaterally Neuro: Patient sensation intact both bilateral lower extremities, has equal strength with hip flexion, knee extension and plantar and dorsiflexion  Left shoulder Inspection: No erythema, edema, warmth Palpation: Tender to palpation of the posterior humeral head ROM: Shoulder flexion to 170 degrees before pain, shoulder abduction to 110 degrees before pain, patient can be passively moved into full shoulder flexion Special Tests: Drop arm negative, empty can with increased pain on the left, internal and external rotation with increased pain to resistance, pushoff test positive on the left, Yergason negative, speeds positive, O'Brien's positive, crossarm test positive Neuro: Sensation intact bilaterally, distal pulses palpable and symmetric, no atrophy noted  [facility] Complete US  of Shoulder Date: 10/30/2023 Location: Left shoulder Indication: Left shoulder pain Findings: Patient was seated on exam table and shoulder US  examination was performed using high frequency linear probe.  - Biceps tendon was visualized within the bicipital groove in both longitudinal and transverse axis with signs of fluid and hypoechoic changes. Tendon irregularity noted - Subscapularis tendon was visualized in both longitudinal and transverse axis with intact tendon inserting at the inferior lesser tubercle of the humerus.  Signs of tearing with hypoechoic change seen. -  Supraspinatus tendon was visualized in longitudinal, transverse, and dynamic views.  Hypoechoic changes present with irregularities.  - Infraspinatus and teres minor tendons were visualized in both longitudinal and transverse axis with tendon insertion at the middle facet of the great tubercle of the humerus with no signs of tearing, hypoechoic changes or tissue irregularity  seen.  - Posterior glenohumeral joint was visualized with proper alignment.  - AC Joint was visualized with effusion  IMPRESSION:  Ultrasound findings significant for biceps tendinopathy as well as subscap and supraspinatus hypoechoic changes   U/S images and interpretation completed by Krystal Lowing, DO and Ludie Littler, MD  Images saved to PACS Assessment/Plan:   Kathy Herrera is a 63 y.o. female who was seen today for the following: 1. Pain in joint of left shoulder (Primary) 2. Rotator cuff injury, initial encounter 3. Arthritis of left acromioclavicular joint -Ultrasound findings significant for biceps injury as well as rotator cuff partial tearing and tendinopathy (subscap and supraspinatus) -Discussed options with patient including formal physical therapy, home exercises, lidocaine  patches, Voltaren  gel and possible injection -At this time patient would like to attempt home exercises as well as Voltaren  gel and lidocaine  patches -Will follow-up in 4 weeks - US  LIMITED JOINT SPACE  STRUCTURES UP LEFT; Future - lidocaine  (LIDODERM ) 5 %; Place 1 patch onto the skin daily. Remove & Discard patch within 12 hours or as directed by MD  Dispense: 30 patch; Refill: 0  4. Low back strain, initial encounter - lidocaine  (LIDODERM ) 5 %; Place 1 patch onto the skin daily. Remove & Discard patch within 12 hours or as directed by MD  Dispense: 30 patch; Refill: 0 -Patient's exam is quite reassuring with no radicular symptoms, or motor weakness -Patient had relief with Voltaren  gel previously, and she can continue to use this -Also sent in lidocaine  patches for patient to use as needed  Follow-up/Education:   Return in about 4 weeks (around 11/27/2023), or if symptoms worsen or fail to improve.   May return sooner as needed and encouraged to call/e-mail for additional questions or  worsening symptoms in the interim.  Krystal Lowing, DO Sports Medicine Fellow 10/30/2023 12:03 PM

## 2023-11-03 LAB — ANTI-TPO AB (RDL): Anti-TPO Ab (RDL): 9599.9 [IU]/mL — ABNORMAL HIGH (ref <9.0)

## 2023-11-04 MED ORDER — LEVOTHYROXINE SODIUM 50 MCG PO TABS: 50.0000 ug | ORAL_TABLET | Freq: Every day | ORAL | 0 refills | Status: DC

## 2023-11-04 NOTE — Addendum Note (Signed)
 Addended by: BERNETA ELSIE LABOR on: 11/04/2023 10:23 AM   Modules accepted: Orders

## 2023-11-06 ENCOUNTER — Other Ambulatory Visit: Payer: Self-pay | Admitting: Acute Care

## 2023-11-06 DIAGNOSIS — Z87891 Personal history of nicotine dependence: Secondary | ICD-10-CM

## 2023-11-06 DIAGNOSIS — F17210 Nicotine dependence, cigarettes, uncomplicated: Secondary | ICD-10-CM

## 2023-11-06 DIAGNOSIS — Z122 Encounter for screening for malignant neoplasm of respiratory organs: Secondary | ICD-10-CM

## 2023-11-13 ENCOUNTER — Other Ambulatory Visit: Payer: Self-pay | Admitting: Family Medicine

## 2023-11-13 DIAGNOSIS — Z1231 Encounter for screening mammogram for malignant neoplasm of breast: Secondary | ICD-10-CM

## 2023-11-18 ENCOUNTER — Telehealth: Payer: Self-pay

## 2023-11-18 ENCOUNTER — Ambulatory Visit
Admission: RE | Admit: 2023-11-18 | Discharge: 2023-11-18 | Disposition: A | Discharge status: Home / Self Care | Source: Ambulatory Visit | Attending: Family Medicine | Admitting: Family Medicine

## 2023-11-18 DIAGNOSIS — Z1231 Encounter for screening mammogram for malignant neoplasm of breast: Secondary | ICD-10-CM | POA: Diagnosis not present

## 2023-11-18 NOTE — Telephone Encounter (Signed)
 Kathy Herrera

## 2023-11-18 NOTE — Telephone Encounter (Signed)
 I spoke with pt and she did het her mobile mammogram earlier today at [facility]. Nothing further needed.

## 2023-12-02 NOTE — Progress Notes (Unsigned)
 [facility] [place] PROGRESS NOTE  ASSESSMENT / PLAN: Kathy Herrera is a 63 y.o. female status post EVAR for AAA, RCIAA, RIIAA 06/24/20. Duplex continues to be limited due to body habitus.  Will plan to repeat CT angiogram to evaluate the repair.  I will refer her to gastroenterology for evaluation of chronic constipation and difficulty with defecation.  SUBJECTIVE: No complaints. No abdominal pain. At baseline level of limited ambulation.  She reports she had some opioid-induced constipation after recent hip surgery.  Constipation is somewhat improved.  She feels there is a additional hole near her anus which she has to push down on sometimes to move her bowels. We reviewed her CT scan.   OBJECTIVE: There were no vitals taken for this visit.  No distress Feet warm and well perfused     Latest Ref Rng & Units 07/23/2023   10:12 AM 06/05/2022   10:49 AM 02/28/2022    2:56 AM  CBC  WBC 4.0 - 10.5 K/uL 7.0  8.7  16.4   Hemoglobin 12.0 - 15.0 g/dL 83.1  84.3  86.1   Hematocrit 36.0 - 46.0 % 52.4  47.1  43.1   Platelets 150.0 - 400.0 K/uL 201.0  223.0  196         Latest Ref Rng & Units 10/24/2023    9:05 AM 07/23/2023   10:12 AM 06/05/2022   10:49 AM  CMP  Glucose 70 - 99 mg/dL 85  86  93   BUN 6 - 23 mg/dL 16  14  15    Creatinine 0.40 - 1.20 mg/dL 8.89  8.94  8.86   Sodium 135 - 145 mEq/L 141  140  140   Potassium 3.5 - 5.1 mEq/L 4.1  4.2  4.2   Chloride 96 - 112 mEq/L 98  101  102   CO2 19 - 32 mEq/L 32  28  30   Calcium  8.4 - 10.5 mg/dL 9.9  89.5  89.5   Total Protein 6.0 - 8.3 g/dL 7.7  8.4  7.5   Total Bilirubin 0.2 - 1.2 mg/dL 0.5  0.5  0.6   Alkaline Phos 39 - 117 U/L 85  90  80   AST 0 - 37 U/L 17  16  15    ALT 0 - 35 U/L 15  11  10     AAA duplex shows sac growth to 63mm. Iliacs not visaulized.   [name] SAILOR. [name], MD [facility] Phone Number: [phone] 12/02/2023 8:04 AM

## 2023-12-03 ENCOUNTER — Encounter: Payer: Self-pay | Admitting: Vascular Surgery

## 2023-12-03 ENCOUNTER — Ambulatory Visit (HOSPITAL_COMMUNITY)
Admission: RE | Admit: 2023-12-03 | Discharge: 2023-12-03 | Disposition: A | Discharge status: Home / Self Care | Source: Ambulatory Visit | Attending: Vascular Surgery | Admitting: Vascular Surgery

## 2023-12-03 ENCOUNTER — Ambulatory Visit: Admitting: Vascular Surgery

## 2023-12-03 VITALS — BP 124/81 | HR 65 | Temp 98.2°F

## 2023-12-03 DIAGNOSIS — I714 Abdominal aortic aneurysm, without rupture, unspecified: Secondary | ICD-10-CM | POA: Diagnosis not present

## 2023-12-03 DIAGNOSIS — Z8679 Personal history of other diseases of the circulatory system: Secondary | ICD-10-CM | POA: Insufficient documentation

## 2023-12-03 DIAGNOSIS — Z9889 Other specified postprocedural states: Secondary | ICD-10-CM

## 2023-12-30 ENCOUNTER — Encounter: Payer: Self-pay | Admitting: Physician Assistant

## 2023-12-30 ENCOUNTER — Ambulatory Visit: Admitting: Physician Assistant

## 2023-12-30 VITALS — BP 105/73

## 2023-12-30 DIAGNOSIS — L732 Hidradenitis suppurativa: Secondary | ICD-10-CM

## 2023-12-30 DIAGNOSIS — Z720 Tobacco use: Secondary | ICD-10-CM | POA: Diagnosis not present

## 2023-12-30 DIAGNOSIS — L720 Epidermal cyst: Secondary | ICD-10-CM

## 2023-12-30 NOTE — Progress Notes (Signed)
   New Patient Visit   Subjective  Kathy Herrera is a 63 y.o. female NEW PATIENT who presents for the following: She has a boil of her left groin area that has been there for about 6 months. She thinks it is getting better but she can't see it. She has gotten boils under her arms and breasts in the past since she was a young teenager. She also has a cyst on her left cheek that has been there ~ 5 years and has gotten larger over the years. She was able to get pus out of it in the past but can not now. Everyday smoker.     The following portions of the chart were reviewed this encounter and updated as appropriate: medications, allergies, medical history  Review of Systems:  No other skin or systemic complaints except as noted in HPI or Assessment and Plan.  Objective  Well appearing patient in no apparent distress; mood and affect are within normal limits.   A focused examination was performed of the following areas: Face, axillae, infra-panus and inguinal region    Relevant exam findings are noted in the Assessment and Plan.      Assessment & Plan   EPIDERMAL INCLUSION CYST Exam: Subcutaneous nodule at left cheek  Benign-appearing. Exam most consistent with an epidermal inclusion cyst. Discussed that a cyst is a benign growth that can grow over time and sometimes get irritated or inflamed. Recommend observation if it is not bothersome. Discussed option of surgical excision to remove it if it is growing, symptomatic, or other changes noted. Please call for new or changing lesions so they can be evaluated.  - Offered referral to [facility] plastic surgery. Patient declined.    HIDRADENITIS SUPPURATIVA - groin  Exam: open comedones and resolving pustules, scars   Hidradenitis Suppurativa is a chronic; persistent; non-curable, but treatable condition due to abnormal inflamed sweat glands in the body folds (axilla, inframammary, groin, medial thighs), causing recurrent painful draining  cysts and scarring. It can be associated with severe scarring acne and cysts; also abscesses and scarring of scalp. The goal is control and prevention of flares, as it is not curable. Scars are permanent and can be thickened. Treatment may include daily use of topical medication and oral antibiotics.  Oral isotretinoin may also be helpful.  For some cases, Humira or Cosentyx (biologic injections) may be prescribed to decrease the inflammatory process and prevent flares.  When indicated, inflamed cysts may also be treated surgically.  Treatment Plan: Recommend alternating Dial antibacterial soap, Hibiclens  and Panoxyl wash. Recommend keeping area clean and dry.  Advised her smoking can exacerbate condition. She is on Chantix  and is trying to stop.  Discussed biologic treatment. Will consider on follow up if not improving.   HIDRADENITIS SUPPURATIVA   EPIDERMAL INCLUSION CYST   TOBACCO ABUSE   Related Medications varenicline  (CHANTIX  CONTINUING MONTH PAK) 1 MG tablet Take 1 tablet (1 mg total) by mouth 2 (two) times daily.  Return in about 6 months (around 06/29/2024) for Hidradenitis follow up.  I, Roseline Hutchinson, CMA, am acting as scribe for Tyesha Joffe K, PA-C .   Documentation: I have reviewed the above documentation for accuracy and completeness, and I agree with the above.  Lunna Vogelgesang K, PA-C

## 2023-12-30 NOTE — Patient Instructions (Addendum)

## 2024-01-06 ENCOUNTER — Encounter: Payer: Self-pay | Admitting: Radiology

## 2024-01-21 ENCOUNTER — Ambulatory Visit: Payer: Self-pay

## 2024-01-21 NOTE — Telephone Encounter (Signed)
 FYI Only or Action Required?: FYI only for provider: appointment scheduled on 11/20.  Patient was last seen in primary care on 10/24/2023 by Berneta Elsie Sayre, MD.  Called Nurse Triage reporting Back Pain.  Symptoms began several months ago.  Interventions attempted: OTC medications: tylenol .  Symptoms are: gradually worsening.  Triage Disposition: See PCP When Office is Open (Within 3 Days)  Patient/caregiver understands and will follow disposition?: Yes, will follow disposition  Copied from CRM #[id number]. Topic: Clinical - Red Word Triage >> Jan 21, 2024  9:39 AM [name] FALCON wrote: Red Word that prompted transfer to Nurse Triage: Patient having lower back pain, diarrhea and symptoms of chrohnes disease Reason for Disposition  [1] MODERATE back pain (e.g., interferes with normal activities) AND [2] present > 3 days  Answer Assessment - Initial Assessment Questions 1. ONSET: When did the pain begin? (e.g., minutes, hours, days)     A few months 2. LOCATION: Where does it hurt? (upper, mid or lower back)     Lower L back 3. SEVERITY: How bad is the pain?  (e.g., Scale 1-10; mild, moderate, or severe)     8 4. PATTERN: Is the pain constant? (e.g., yes, no; constant, intermittent)      constant 5. RADIATION: Does the pain shoot into your legs or somewhere else?     Stays L lower back 6. CAUSE:  What do you think is causing the back pain?      Unsure, states she was rolling out of bed the other day and caught herself from falling 7. BACK OVERUSE:  Any recent lifting of heavy objects, strenuous work or exercise?     denies 8. MEDICINES: What have you taken so far for the pain? (e.g., nothing, acetaminophen , NSAIDS)     Tylenol  does not help 9. NEUROLOGIC SYMPTOMS: Do you have any weakness, numbness, or problems with bowel/bladder control?     denies 10. OTHER SYMPTOMS: Do you have any other symptoms? (e.g., fever, abdomen pain, burning with urination, blood  in urine)      Diarrhea,   Denies GU s/s. Offered appt tomorrow, pt refused.  Protocols used: Back Pain-A-AH

## 2024-01-23 ENCOUNTER — Ambulatory Visit (INDEPENDENT_AMBULATORY_CARE_PROVIDER_SITE_OTHER): Admitting: Nurse Practitioner

## 2024-01-23 ENCOUNTER — Encounter: Payer: Self-pay | Admitting: Nurse Practitioner

## 2024-01-23 VITALS — BP 124/82 | HR 78 | Temp 97.2°F | Ht 67.0 in | Wt 235.0 lb

## 2024-01-23 DIAGNOSIS — H8111 Benign paroxysmal vertigo, right ear: Secondary | ICD-10-CM | POA: Insufficient documentation

## 2024-01-23 DIAGNOSIS — M5136 Other intervertebral disc degeneration, lumbar region with discogenic back pain only: Secondary | ICD-10-CM | POA: Diagnosis not present

## 2024-01-23 MED ORDER — TIZANIDINE HCL 4 MG PO TABS: 4.0000 mg | ORAL_TABLET | Freq: Three times a day (TID) | ORAL | 0 refills | Status: AC | PRN

## 2024-01-23 NOTE — Assessment & Plan Note (Signed)
 Chronic, not controlled. She experiences benign paroxysmal positional vertigo in the right ear, causing dizziness with head movement. Perform daily rolling exercises to reposition otoliths. Consider referral to a physical therapist if there is no improvement.

## 2024-01-23 NOTE — Assessment & Plan Note (Signed)
 Chronic lumbar disc degeneration causes discogenic back pain. MRI in [place] with moderate canal stenosis. Tylenol  is ineffective, and NSAIDs are contraindicated due to Crohn's disease. She performs band exercises as advised by a sports medicine specialist. Prescribe tizanidine 4mg  up to three times daily as needed, with caution about drowsiness. Continue band exercises and follow up with the sports medicine specialist if needed.

## 2024-01-23 NOTE — Progress Notes (Signed)
 Acute Office Visit  Subjective:     Patient ID: Ravenne Wayment [facility], female    DOB: [birth year], 63 y.o.   MRN: [id number]  Chief Complaint  Patient presents with   Back Pain    Lower back pain for 6 months   HPI:  Discussed the use of AI scribe software for clinical note transcription with the patient, who gave verbal consent to proceed.  History of Present Illness   ASHLI SELDERS is a 63 year old female with Crohn's disease who presents with back pain and dizziness.  She experiences persistent low-back pain, worsened by movement and activities like getting out of bed. Tylenol  is ineffective, and she cannot use ibuprofen due to Crohn's disease. She has not recently used tramadol . She denies fevers, and radiation of pain.   Dizziness occurs when moving her head backward to the right, persisting for about six months. It is not painful but causes discomfort when lying on her right side. The dizziness is triggered by specific head movements. She denies chest pain and shortness of breath.      ROS See pertinent positives and negatives per HPI.     Objective:    BP 124/82 (BP Location: Right Arm, Patient Position: Sitting, Cuff Size: Large)   Pulse 78   Temp (!) 97.2 F (36.2 C)   Ht 5' 7 (1.702 m)   Wt 235 lb (106.6 kg)   SpO2 97%   BMI 36.81 kg/m    Physical Exam Vitals and nursing note reviewed.  Constitutional:      General: She is not in acute distress.    Appearance: Normal appearance.  HENT:     Head: Normocephalic.     Right Ear: Tympanic membrane, ear canal and external ear normal.     Left Ear: Tympanic membrane, ear canal and external ear normal.  Eyes:     Conjunctiva/sclera: Conjunctivae normal.  Cardiovascular:     Rate and Rhythm: Normal rate and regular rhythm.     Pulses: Normal pulses.     Heart sounds: Normal heart sounds.  Pulmonary:     Effort: Pulmonary effort is normal.     Breath sounds: Normal breath sounds.  Musculoskeletal:         General: No swelling or tenderness.     Cervical back: Normal range of motion.     Comments: Lumbar ROM limited due to pain  Skin:    General: Skin is warm.  Neurological:     General: No focal deficit present.     Mental Status: She is alert and oriented to person, place, and time.     Comments: Dix hallpike maneuver positive on right   Psychiatric:        Mood and Affect: Mood normal.        Behavior: Behavior normal.        Thought Content: Thought content normal.        Judgment: Judgment normal.       Assessment & Plan:   Problem List Items Addressed This Visit       Nervous and Auditory   Benign paroxysmal positional vertigo of right ear   Chronic, not controlled. She experiences benign paroxysmal positional vertigo in the right ear, causing dizziness with head movement. Perform daily rolling exercises to reposition otoliths. Consider referral to a physical therapist if there is no improvement.         Musculoskeletal and Integument   Lumbar degenerative disc disease - Primary (Chronic)  Chronic lumbar disc degeneration causes discogenic back pain. MRI in [place] with moderate canal stenosis. Tylenol  is ineffective, and NSAIDs are contraindicated due to Crohn's disease. She performs band exercises as advised by a sports medicine specialist. Prescribe tizanidine  4mg  up to three times daily as needed, with caution about drowsiness. Continue band exercises and follow up with the sports medicine specialist if needed.       Meds ordered this encounter  Medications   tiZANidine  (ZANAFLEX ) 4 MG tablet    Sig: Take 1 tablet (4 mg total) by mouth every 8 (eight) hours as needed for muscle spasms.    Dispense:  30 tablet    Refill:  0    Return if symptoms worsen or fail to improve.  Tinnie DELENA Harada, NP

## 2024-01-23 NOTE — Patient Instructions (Signed)
 It was great to see you!  Start the rolling exercises we discussed   Start tizanidine 3 times a day as needed - this may make you sleepy   Keep doing the stretches and follow-up with sports medicine   Let's follow-up if symptoms worsen or don't improve   Take care,  Tinnie Harada, NP

## 2024-01-25 ENCOUNTER — Other Ambulatory Visit: Payer: Self-pay | Admitting: Family Medicine

## 2024-01-25 DIAGNOSIS — E039 Hypothyroidism, unspecified: Secondary | ICD-10-CM

## 2024-01-25 DIAGNOSIS — I10 Essential (primary) hypertension: Secondary | ICD-10-CM

## 2024-01-25 DIAGNOSIS — I2510 Atherosclerotic heart disease of native coronary artery without angina pectoris: Secondary | ICD-10-CM

## 2024-01-25 DIAGNOSIS — E785 Hyperlipidemia, unspecified: Secondary | ICD-10-CM

## 2024-03-04 LAB — HM DIABETES EYE EXAM

## 2024-03-09 ENCOUNTER — Encounter: Payer: Self-pay | Admitting: Family Medicine

## 2024-03-23 ENCOUNTER — Ambulatory Visit

## 2024-03-23 VITALS — Ht 67.0 in | Wt 240.0 lb

## 2024-03-23 DIAGNOSIS — Z0000 Encounter for general adult medical examination without abnormal findings: Secondary | ICD-10-CM | POA: Diagnosis not present

## 2024-03-23 NOTE — Patient Instructions (Signed)
 Ms. Whetsel,  Thank you for taking the time for your Medicare Wellness Visit. I appreciate your continued commitment to your health goals. Please review the care plan we discussed, and feel free to reach out if I can assist you further.  Please note that Annual Wellness Visits do not include a physical exam. Some assessments may be limited, especially if the visit was conducted virtually. If needed, we may recommend an in-person follow-up with your provider.  Ongoing Care Seeing your primary care provider every 3 to 6 months helps us  monitor your health and provide consistent, personalized care.   Referrals If a referral was made during today's visit and you haven't received any updates within two weeks, please contact the referred provider directly to check on the status.  Recommended Screenings:  Health Maintenance  Topic Date Due   Pap with HPV screening  10/29/2022   Medicare Annual Wellness Visit  02/18/2024   Screening for Lung Cancer  10/10/2024   Breast Cancer Screening  11/17/2024   Colon Cancer Screening  01/22/2033   Pneumococcal Vaccine for age over 71  Completed   Flu Shot  Completed   HPV Vaccine (No Doses Required) Completed   Hepatitis C Screening  Completed   HIV Screening  Completed   Hepatitis B Vaccine  Aged Out   Meningitis B Vaccine  Aged Out   DTaP/Tdap/Td vaccine  Discontinued   COVID-19 Vaccine  Discontinued   Zoster (Shingles) Vaccine  Discontinued       03/23/2024    1:46 PM  Advanced Directives  Does Patient Have a Medical Advance Directive? No  Would patient like information on creating a medical advance directive? No - Patient declined    Vision: Annual vision screenings are recommended for early detection of glaucoma, cataracts, and diabetic retinopathy. These exams can also reveal signs of chronic conditions such as diabetes and high blood pressure.  Dental: Annual dental screenings help detect early signs of oral cancer, gum disease, and  other conditions linked to overall health, including heart disease and diabetes.  Please see the attached documents for additional preventive care recommendations.

## 2024-03-23 NOTE — Progress Notes (Signed)
 "  Chief Complaint  Patient presents with   Medicare Wellness     Subjective:   Kathy Herrera is a 64 y.o. female who presents for a Medicare Annual Wellness Visit.  Visit info / Clinical Intake: Medicare Wellness Visit Type:: Subsequent Annual Wellness Visit Persons participating in visit and providing information:: patient Medicare Wellness Visit Mode:: Telephone If telephone:: video declined Since this visit was completed virtually, some vitals may be partially provided or unavailable. Missing vitals are due to the limitations of the virtual format.: Documented vitals are patient reported If Telephone or Video please confirm:: I connected with patient using audio/video enable telemedicine. I verified patient identity with two identifiers, discussed telehealth limitations, and patient agreed to proceed. Patient Location:: home Provider Location:: office Interpreter Needed?: No Pre-visit prep was completed: yes AWV questionnaire completed by patient prior to visit?: no Living arrangements:: lives with spouse/significant other; with family/others Patient's Overall Health Status Rating: good Typical amount of pain: (!) a lot Does pain affect daily life?: (!) yes Are you currently prescribed opioids?: no  Dietary Habits and Nutritional Risks How many meals a day?: 2 Eats fruit and vegetables daily?: (!) no Most meals are obtained by: preparing own meals In the last 2 weeks, have you had any of the following?: none Diabetic:: no  Functional Status Activities of Daily Living (to include ambulation/medication): Independent Ambulation: Independent with device- listed below Home Assistive Devices/Equipment: Johna Finder (specify Type) Medication Administration: Independent Home Management (perform basic housework or laundry): Needs assistance (comment) Manage your own finances?: yes Primary transportation is: driving Concerns about vision?: no *vision screening is required for  WTM* Concerns about hearing?: no  Fall Screening Falls in the past year?: 0 Number of falls in past year: 0 Was there an injury with Fall?: 0 Fall Risk Category Calculator: 0 Patient Fall Risk Level: Low Fall Risk  Fall Risk Patient at Risk for Falls Due to: Medication side effect Fall risk Follow up: Falls prevention discussed; Education provided; Falls evaluation completed  Home and Transportation Safety: All rugs have non-skid backing?: N/A, no rugs All stairs or steps have railings?: N/A, no stairs Grab bars in the bathtub or shower?: (!) no Have non-skid surface in bathtub or shower?: yes Good home lighting?: yes Regular seat belt use?: yes Hospital stays in the last year:: no  Cognitive Assessment Difficulty concentrating, remembering, or making decisions? : yes Will 6CIT or Mini Cog be Completed: yes What year is it?: 0 points What month is it?: 0 points Give patient an address phrase to remember (5 components): [address] [place] About what time is it?: 0 points Count backwards from 20 to 1: 0 points Say the months of the year in reverse: 0 points Repeat the address phrase from earlier: 2 points 6 CIT Score: 2 points  Advance Directives (For Healthcare) Does Patient Have a Medical Advance Directive?: No Would patient like information on creating a medical advance directive?: No - Patient declined  Reviewed/Updated  Reviewed/Updated: Reviewed All (Medical, Surgical, Family, Medications, Allergies, Care Teams, Patient Goals)    Allergies (verified) Shrimp [shellfish allergy] and Percocet [oxycodone -acetaminophen ]   Current Medications (verified) Outpatient Encounter Medications as of 03/23/2024  Medication Sig   acetaminophen  (TYLENOL ) 500 MG tablet Take 1,000 mg by mouth every 6 (six) hours as needed for moderate pain.   albuterol  (VENTOLIN  HFA) 108 (90 Base) MCG/ACT inhaler Inhale 2 puffs into the lungs every 6 (six) hours as needed for wheezing or  shortness of breath.  aspirin  81 MG chewable tablet Chew 1 tablet (81 mg total) by mouth 2 (two) times daily.   atenolol  (TENORMIN ) 100 MG tablet Take 1 tablet by mouth once daily   cholecalciferol  (VITAMIN D ) 25 MCG (1000 UNIT) tablet Take 1,000 Units by mouth daily.   hydrochlorothiazide  (HYDRODIURIL ) 25 MG tablet Take 1 tablet by mouth once daily   levothyroxine  (SYNTHROID ) 50 MCG tablet Take 1 tablet by mouth once daily   lidocaine  (LIDODERM ) 5 % Place 1 patch onto the skin daily. Remove & Discard patch within 12 hours or as directed by MD   lisinopril  (ZESTRIL ) 40 MG tablet TAKE 1 TABLET BY MOUTH ONCE DAILY . APPOINTMENT REQUIRED FOR FUTURE REFILLS   metFORMIN  (GLUCOPHAGE -XR) 500 MG 24 hr tablet Take 1 tablet (500 mg total) by mouth at bedtime.   Multiple Vitamins-Minerals (MULTIVITAMIN WITH MINERALS) tablet Take 1 tablet by mouth daily.   rosuvastatin  (CRESTOR ) 10 MG tablet TAKE 1 TABLET BY MOUTH ONCE DAILY . APPOINTMENT REQUIRED FOR FUTURE REFILLS   MELATONIN PO Take 1 tablet by mouth at bedtime as needed (sleep). (Patient not taking: Reported on 03/23/2024)   potassium chloride  (KLOR-CON  M) 10 MEQ tablet Take 1 tablet (10 mEq total) by mouth daily. (Patient not taking: Reported on 03/23/2024)   tiZANidine  (ZANAFLEX ) 4 MG tablet Take 1 tablet (4 mg total) by mouth every 8 (eight) hours as needed for muscle spasms. (Patient not taking: Reported on 03/23/2024)   traZODone  (DESYREL ) 50 MG tablet Take 50 mg by mouth at bedtime as needed for sleep. (Patient not taking: Reported on 03/23/2024)   varenicline  (CHANTIX  CONTINUING MONTH PAK) 1 MG tablet Take 1 tablet (1 mg total) by mouth 2 (two) times daily. (Patient not taking: Reported on 03/23/2024)   vitamin B-12 (CYANOCOBALAMIN ) 100 MCG tablet Take 100 mcg by mouth daily. (Patient not taking: Reported on 03/23/2024)   No facility-administered encounter medications on file as of 03/23/2024.    History: Past Medical History:  Diagnosis Date   AAA  (abdominal aortic aneurysm)    Crohn disease (HCC)    Depression    History. No current problems   DJD (degenerative joint disease) of lumbar spine    Dyspnea    With Activity   GERD (gastroesophageal reflux disease)    no current problems   Heart murmur    patient denies, states she was diagnosed in [place], no follow up here   Heart murmur    no current problems   Hyperlipidemia    Hypertension    Hyperthyroidism    Lactose intolerance    Perforated viscus 07/2015   Peripheral vascular disease    Rheumatoid arthritis (HCC)    STD (sexually transmitted disease)    Condyloma   Thyroid  disorder    Vitamin D  deficiency    Past Surgical History:  Procedure Laterality Date   ABDOMINAL AORTIC ENDOVASCULAR STENT GRAFT Bilateral 06/24/2020   Procedure: ABDOMINAL AORTIC ENDOVASCULAR STENT GRAFT;  Surgeon: [name] [name] SAILOR, MD;  Location: [facility] OR;  Service: Vascular;  Laterality: Bilateral;   ABDOMINAL HYSTERECTOMY     partial- still has cervix.   arm surgery Left    CHOLECYSTECTOMY     COLONOSCOPY     Last one prior to COVID   EMBOLIZATION Right 06/24/2020   Procedure: COIL EMBOLIZATION RIGHT INTERNAL ILIAC ARTERY;  Surgeon: [name] [name] SAILOR, MD;  Location: [facility] OR;  Service: Vascular;  Laterality: Right;   LUMBAR FUSION N/A 02/02/2014   Procedure: CENTRAL LAMINECTOMY L4-5,L5-S1, TRANSFORAMINAL LUMBAR  INTERBODY FUSION L4-5,L5-S1, RODS AND SCREW FIX  AND L5-S1, LOCAL BONE GRAFT;  Surgeon: Lynwood FORBES Better, MD;  Location: [facility] OR;  Service: Orthopedics;  Laterality: N/A;   TONSILLECTOMY AND ADENOIDECTOMY  1992   TOTAL HIP ARTHROPLASTY Left 02/27/2022   Procedure: LEFT TOTAL HIP ARTHROPLASTY ANTERIOR APPROACH;  Surgeon: Vernetta Lonni GRADE, MD;  Location: [facility] OR;  Service: Orthopedics;  Laterality: Left;  Needs RNFA   ULTRASOUND GUIDANCE FOR VASCULAR ACCESS Bilateral 06/24/2020   Procedure: ULTRASOUND GUIDANCE FOR VASCULAR ACCESS;  Surgeon: [name] [name] SAILOR, MD;  Location: [facility] OR;   Service: Vascular;  Laterality: Bilateral;   Family History  Problem Relation Age of Onset   Cancer Mother 57       cervical   CVA Mother    Hypertension Mother    Diabetes Mother    Obesity Mother    High Cholesterol Mother    Thyroid  disease Daughter    Diabetes Daughter    Breast cancer Neg Hx    Social History   Occupational History   Occupation: Retired Media Planner  Tobacco Use   Smoking status: Every Day    Current packs/day: 0.50    Average packs/day: 0.5 packs/day for 55.8 years (27.9 ttl pk-yrs)    Types: Cigars, Cigarettes    Start date: 05/27/1968    Passive exposure: Never   Smokeless tobacco: Never   Tobacco comments:    Quit smoking 2 weeks ago  Vaping Use   Vaping status: Never Used  Substance and Sexual Activity   Alcohol use: Not Currently    Comment: rarely   Drug use: No   Sexual activity: Not Currently    Birth control/protection: Surgical    Comment: 1st intercourse less than15 yo-Fewer than 5 partners   Tobacco Counseling Ready to quit: Yes Counseling given: Not Answered Tobacco comments: Quit smoking 2 weeks ago  SDOH Screenings   Food Insecurity: No Food Insecurity (03/23/2024)  Housing: Unknown (03/23/2024)  Transportation Needs: No Transportation Needs (03/23/2024)  Utilities: Not At Risk (03/23/2024)  Alcohol Screen: Low Risk (03/23/2024)  Depression (PHQ2-9): Low Risk (03/23/2024)  Financial Resource Strain: Low Risk (03/23/2024)  Physical Activity: Inactive (03/23/2024)  Social Connections: Moderately Isolated (03/23/2024)  Stress: No Stress Concern Present (03/23/2024)  Tobacco Use: High Risk (03/23/2024)  Health Literacy: Adequate Health Literacy (03/23/2024)   See flowsheets for full screening details  Depression Screen PHQ 2 & 9 Depression Scale- Over the past 2 weeks, how often have you been bothered by any of the following problems? Little interest or pleasure in doing things: 0 Feeling down, depressed, or hopeless (PHQ  Adolescent also includes...irritable): 0 PHQ-2 Total Score: 0 Trouble falling or staying asleep, or sleeping too much: 0 Feeling tired or having little energy: 3 Poor appetite or overeating (PHQ Adolescent also includes...weight loss): 0 Feeling bad about yourself - or that you are a failure or have let yourself or your family down: 0 Trouble concentrating on things, such as reading the newspaper or watching television (PHQ Adolescent also includes...like school work): 0 Moving or speaking so slowly that other people could have noticed. Or the opposite - being so fidgety or restless that you have been moving around a lot more than usual: 0 Thoughts that you would be better off dead, or of hurting yourself in some way: 0 PHQ-9 Total Score: 3 If you checked off any problems, how difficult have these problems made it for you to do your work, take care of things at home, or  get along with other people?: Not difficult at all  Depression Treatment Depression Interventions/Treatment : EYV7-0 Score <4 Follow-up Not Indicated     Goals Addressed             This Visit's Progress    Weight (lb) < 200 lb (90.7 kg)   240 lb (108.9 kg)            Objective:    Today's Vitals   03/23/24 1337  Weight: 240 lb (108.9 kg)  Height: 5' 7 (1.702 m)   Body mass index is 37.59 kg/m.  Hearing/Vision screen Vision Screening - Comments:: Regular eye exams, [facility] Immunizations and Health Maintenance Health Maintenance  Topic Date Due   Cervical Cancer Screening (HPV/Pap Cotest)  10/29/2022   Lung Cancer Screening  10/10/2024   Mammogram  11/17/2024   Medicare Annual Wellness (AWV)  03/23/2025   Colonoscopy  01/22/2033   Pneumococcal Vaccine: 50+ Years  Completed   Influenza Vaccine  Completed   HPV VACCINES (No Doses Required) Completed   Hepatitis C Screening  Completed   HIV Screening  Completed   Hepatitis B Vaccines 19-59 Average Risk  Aged Out   Meningococcal B Vaccine  Aged  Out   DTaP/Tdap/Td  Discontinued   COVID-19 Vaccine  Discontinued   Zoster Vaccines- Shingrix  Discontinued        Assessment/Plan:  This is a routine wellness examination for Kathy Herrera.  Patient Care Team: Berneta Elsie Sayre, MD as PCP - General (Family Medicine)  I have personally reviewed and noted the following in the patients chart:   Medical and social history Use of alcohol, tobacco or illicit drugs  Current medications and supplements including opioid prescriptions. Functional ability and status Nutritional status Physical activity Advanced directives List of other physicians Hospitalizations, surgeries, and ER visits in previous 12 months Vitals Screenings to include cognitive, depression, and falls Referrals and appointments  No orders of the defined types were placed in this encounter.  In addition, I have reviewed and discussed with patient certain preventive protocols, quality metrics, and best practice recommendations. A written personalized care plan for preventive services as well as general preventive health recommendations were provided to patient.   Ardella FORBES Dawn, LPN   8/80/7973   Return in 1 year (on 03/23/2025).  After Visit Summary: (MyChart) Due to this being a telephonic visit, the after visit summary with patients personalized plan was offered to patient via MyChart   Nurse Notes: Screenings not ordered: Declined Referral/Order for pap. States she will get done when weather is warmer.  "

## 2024-06-16 ENCOUNTER — Ambulatory Visit: Admitting: Family Medicine

## 2024-07-01 ENCOUNTER — Ambulatory Visit: Admitting: Physician Assistant
